# Patient Record
Sex: Female | Born: 1960 | Hispanic: Yes | Marital: Single | Smoking: Never smoker
Health system: Southern US, Community
[De-identification: ages and names within clinical notes are randomized; demographics above are authoritative.]

## PROBLEM LIST (undated history)

## (undated) DIAGNOSIS — N289 Disorder of kidney and ureter, unspecified: Secondary | ICD-10-CM

## (undated) DIAGNOSIS — I2510 Atherosclerotic heart disease of native coronary artery without angina pectoris: Secondary | ICD-10-CM

## (undated) DIAGNOSIS — N200 Calculus of kidney: Secondary | ICD-10-CM

## (undated) DIAGNOSIS — A419 Sepsis, unspecified organism: Secondary | ICD-10-CM

## (undated) DIAGNOSIS — I10 Essential (primary) hypertension: Secondary | ICD-10-CM

---

## 2010-01-18 ENCOUNTER — Ambulatory Visit (HOSPITAL_COMMUNITY): Admission: RE | Admit: 2010-01-18 | Discharge: 2010-01-18 | Payer: Self-pay | Admitting: Internal Medicine

## 2010-01-31 ENCOUNTER — Ambulatory Visit (HOSPITAL_COMMUNITY): Admission: RE | Admit: 2010-01-31 | Discharge: 2010-01-31 | Payer: Self-pay | Admitting: Internal Medicine

## 2010-08-08 ENCOUNTER — Ambulatory Visit (HOSPITAL_COMMUNITY)
Admission: RE | Admit: 2010-08-08 | Discharge: 2010-08-08 | Payer: Self-pay | Source: Home / Self Care | Admitting: Internal Medicine

## 2010-10-10 ENCOUNTER — Ambulatory Visit (HOSPITAL_COMMUNITY)
Admission: RE | Admit: 2010-10-10 | Discharge: 2010-10-10 | Payer: Self-pay | Source: Home / Self Care | Attending: Internal Medicine | Admitting: Internal Medicine

## 2011-04-15 ENCOUNTER — Other Ambulatory Visit (HOSPITAL_COMMUNITY): Payer: Self-pay | Admitting: Internal Medicine

## 2011-04-15 DIAGNOSIS — Z09 Encounter for follow-up examination after completed treatment for conditions other than malignant neoplasm: Secondary | ICD-10-CM

## 2011-05-29 ENCOUNTER — Encounter (HOSPITAL_COMMUNITY): Payer: Self-pay

## 2012-04-01 ENCOUNTER — Inpatient Hospital Stay (HOSPITAL_COMMUNITY): Admission: RE | Admit: 2012-04-01 | Payer: Self-pay | Source: Ambulatory Visit

## 2012-04-29 ENCOUNTER — Encounter (HOSPITAL_COMMUNITY): Payer: Self-pay

## 2012-08-26 ENCOUNTER — Other Ambulatory Visit (HOSPITAL_COMMUNITY)
Admission: RE | Admit: 2012-08-26 | Discharge: 2012-08-26 | Disposition: A | Discharge status: Home / Self Care | Payer: BC Managed Care – PPO | Source: Ambulatory Visit | Attending: Obstetrics and Gynecology | Admitting: Obstetrics and Gynecology

## 2012-08-26 DIAGNOSIS — Z1151 Encounter for screening for human papillomavirus (HPV): Secondary | ICD-10-CM | POA: Insufficient documentation

## 2012-08-26 DIAGNOSIS — Z01419 Encounter for gynecological examination (general) (routine) without abnormal findings: Secondary | ICD-10-CM | POA: Insufficient documentation

## 2012-10-07 HISTORY — PX: BREAST BIOPSY: SHX20

## 2013-10-07 DIAGNOSIS — A419 Sepsis, unspecified organism: Secondary | ICD-10-CM

## 2013-10-07 HISTORY — DX: Sepsis, unspecified organism: A41.9

## 2018-05-04 ENCOUNTER — Emergency Department: Payer: BLUE CROSS/BLUE SHIELD

## 2018-05-04 ENCOUNTER — Other Ambulatory Visit: Payer: Self-pay

## 2018-05-04 ENCOUNTER — Emergency Department
Admission: EM | Admit: 2018-05-04 | Discharge: 2018-05-04 | Disposition: A | Discharge status: Home / Self Care | Payer: Self-pay | Attending: Emergency Medicine | Admitting: Emergency Medicine

## 2018-05-04 ENCOUNTER — Encounter: Payer: Self-pay | Admitting: Emergency Medicine

## 2018-05-04 DIAGNOSIS — I2510 Atherosclerotic heart disease of native coronary artery without angina pectoris: Secondary | ICD-10-CM | POA: Insufficient documentation

## 2018-05-04 DIAGNOSIS — N23 Unspecified renal colic: Secondary | ICD-10-CM | POA: Insufficient documentation

## 2018-05-04 DIAGNOSIS — I10 Essential (primary) hypertension: Secondary | ICD-10-CM | POA: Insufficient documentation

## 2018-05-04 DIAGNOSIS — R911 Solitary pulmonary nodule: Secondary | ICD-10-CM | POA: Insufficient documentation

## 2018-05-04 HISTORY — DX: Atherosclerotic heart disease of native coronary artery without angina pectoris: I25.10

## 2018-05-04 HISTORY — DX: Essential (primary) hypertension: I10

## 2018-05-04 LAB — CHLAMYDIA/NGC RT PCR (ARMC ONLY)
Chlamydia Tr: NOT DETECTED
N GONORRHOEAE: NOT DETECTED

## 2018-05-04 LAB — URINALYSIS, ROUTINE W REFLEX MICROSCOPIC
BILIRUBIN URINE: NEGATIVE
Glucose, UA: NEGATIVE mg/dL
KETONES UR: NEGATIVE mg/dL
Nitrite: NEGATIVE
PROTEIN: 30 mg/dL — AB
RBC / HPF: >50 RBC/hpf — ABNORMAL HIGH (ref 0–5)
Specific Gravity, Urine: 1.026 (ref 1.005–1.030)
WBC, UA: >50 WBC/hpf — ABNORMAL HIGH (ref 0–5)
pH: 5 (ref 5.0–8.0)

## 2018-05-04 LAB — BASIC METABOLIC PANEL
Anion gap: 10 (ref 5–15)
BUN: 29 mg/dL — AB (ref 6–20)
CO2: 25 mmol/L (ref 22–32)
CREATININE: 0.9 mg/dL (ref 0.44–1.00)
Calcium: 9.2 mg/dL (ref 8.9–10.3)
Chloride: 108 mmol/L (ref 98–111)
Glucose, Bld: 110 mg/dL — ABNORMAL HIGH (ref 70–99)
POTASSIUM: 4.1 mmol/L (ref 3.5–5.1)
SODIUM: 143 mmol/L (ref 135–145)

## 2018-05-04 LAB — CBC
HCT: 35.8 % (ref 35.0–47.0)
HEMOGLOBIN: 12.9 g/dL (ref 12.0–16.0)
MCH: 29.8 pg (ref 26.0–34.0)
MCHC: 36 g/dL (ref 32.0–36.0)
MCV: 82.9 fL (ref 80.0–100.0)
PLATELETS: 343 10*3/uL (ref 150–440)
RBC: 4.32 MIL/uL (ref 3.80–5.20)
RDW: 14.3 % (ref 11.5–14.5)
WBC: 10.9 10*3/uL (ref 3.6–11.0)

## 2018-05-04 LAB — WET PREP, GENITAL
Clue Cells Wet Prep HPF POC: NONE SEEN
Sperm: NONE SEEN
TRICH WET PREP: NONE SEEN
YEAST WET PREP: NONE SEEN

## 2018-05-04 MED ORDER — TAMSULOSIN HCL 0.4 MG PO CAPS
0.4000 mg | ORAL_CAPSULE | Freq: Every day | ORAL | 0 refills | Status: DC
Start: 2018-05-04 — End: 2018-11-18

## 2018-05-04 MED ORDER — OXYCODONE-ACETAMINOPHEN 7.5-325 MG PO TABS: 1.0000 | ORAL_TABLET | Freq: Three times a day (TID) | ORAL | 0 refills | Status: DC | PRN

## 2018-05-04 MED ORDER — KETOROLAC TROMETHAMINE 60 MG/2ML IM SOLN
INTRAMUSCULAR | Status: AC
  Administered 2018-05-04: 60 mg via INTRAMUSCULAR
  Filled 2018-05-04: qty 2

## 2018-05-04 MED ORDER — KETOROLAC TROMETHAMINE 60 MG/2ML IM SOLN
60.0000 mg | Freq: Once | INTRAMUSCULAR | Status: AC
  Administered 2018-05-04: 60 mg via INTRAMUSCULAR

## 2018-05-04 NOTE — ED Notes (Signed)
Pt to CT. ABCs intact. NAD 

## 2018-05-04 NOTE — ED Notes (Signed)
Pt reports having intercourse Tuesday, reports same pain during intercourse, noticing scant amounts of blood and brownish discharge in following days.

## 2018-05-04 NOTE — ED Triage Notes (Signed)
Pt in via POV with complaints of vaginal pain, reports having mirena device inserted x 3 years ago, reports pain feels like a "pencil stabbing me."  Pt to PCP Friday due to same pain, dx with UTI, starting Keflex Saturday with no relief of pain.  NAD noted at this time.

## 2018-05-04 NOTE — ED Notes (Signed)
MD at bedside to perform pelvic exam and obtain wet prep/chlamydia specimens

## 2018-05-04 NOTE — ED Provider Notes (Signed)
[facility] Emergency Department Provider Note       Time seen: ----------------------------------------- 9:23 PM on 05/04/2018 -----------------------------------------   I have reviewed the triage vital signs and the nursing notes.  HISTORY   Chief Complaint Vaginal Pain    HPI Sarah Davila is a 57 y.o. female with a history of coronary artery disease and hypertension who presents to the ED for pelvic pain.  Patient presents to the ER after having intercourse on Tuesday, she reports some pain during intercourse with scant amount of blood and brownish discharge in the following days.  She did recently have some left flank pain as well.  She denies fevers, chills or other complaints.  She was seen by her primary care doctor and started on antibiotics for UTI.  Past Medical History:  Diagnosis Date  . Coronary artery disease   . Hypertension     There are no active problems to display for this patient.   History reviewed. No pertinent surgical history.  Allergies Ciprofloxacin  Social History Social History   Tobacco Use  . Smoking status: Never Smoker  . Smokeless tobacco: Never Used  Substance Use Topics  . Alcohol use: Never    Frequency: Never  . Drug use: Never   Review of Systems Constitutional: Negative for fever. Cardiovascular: Negative for chest pain. Respiratory: Negative for shortness of breath. Gastrointestinal: Negative for abdominal pain, vomiting and diarrhea. Genitourinary: Positive for intermittent dysuria and pelvic pain Musculoskeletal: Negative for back pain. Skin: Negative for rash. Neurological: Negative for headaches, focal weakness or numbness.  All systems negative/normal/unremarkable except as stated in the HPI  ____________________________________________   PHYSICAL EXAM:  VITAL SIGNS: ED Triage Vitals  Enc Vitals Group     BP 05/04/18 2111 (!) 174/81     Pulse Rate 05/04/18 2111 100      Resp 05/04/18 2111 16     Temp 05/04/18 2111 98.5 F (36.9 C)     Temp Source 05/04/18 2111 Oral     SpO2 05/04/18 2111 97 %     Weight 05/04/18 2113 242 lb (109.8 kg)     Height 05/04/18 2113 5\' 2"  (1.575 m)     Head Circumference --      Peak Flow --      Pain Score 05/04/18 2112 8     Pain Loc --      Pain Edu? --      Excl. in GC? --    Constitutional: Alert and oriented. Well appearing and in no distress. Eyes: Conjunctivae are normal. Normal extraocular movements. ENT   Head: Normocephalic and atraumatic.   Nose: No congestion/rhinnorhea.   Mouth/Throat: Mucous membranes are moist.   Neck: No stridor. Cardiovascular: Normal rate, regular rhythm. No murmurs, rubs, or gallops. Respiratory: Normal respiratory effort without tachypnea nor retractions. Breath sounds are clear and equal bilaterally. No wheezes/rales/rhonchi. Gastrointestinal: Soft and nontender. Normal bowel sounds Genitourinary: Normal pelvic exam, slight left adnexal tenderness Musculoskeletal: Nontender with normal range of motion in extremities. No lower extremity tenderness nor edema. Neurologic:  Normal speech and language. No gross focal neurologic deficits are appreciated.  Skin:  Skin is warm, dry and intact. No rash noted. Psychiatric: Mood and affect are normal. Speech and behavior are normal.  ____________________________________________  ED COURSE:  As part of my medical decision making, I reviewed the following data within the electronic MEDICAL RECORD NUMBER History obtained from family if available, nursing notes, old chart and ekg, as well as notes  from prior ED visits. Patient presented for pelvic pain, we will assess with labs and imaging as indicated at this time.   Procedures ____________________________________________   LABS (pertinent positives/negatives)  Labs Reviewed  WET PREP, GENITAL - Abnormal; Notable for the following components:      Result Value   WBC, Wet Prep HPF  POC RARE (*)    All other components within normal limits  URINALYSIS, ROUTINE W REFLEX MICROSCOPIC - Abnormal; Notable for the following components:   Color, Urine YELLOW (*)    APPearance HAZY (*)    Hgb urine dipstick MODERATE (*)    Protein, ur 30 (*)    Leukocytes, UA SMALL (*)    RBC / HPF >50 (*)    WBC, UA >50 (*)    Bacteria, UA RARE (*)    All other components within normal limits  BASIC METABOLIC PANEL - Abnormal; Notable for the following components:   Glucose, Bld 110 (*)    BUN 29 (*)    All other components within normal limits  CHLAMYDIA/NGC RT PCR ([facility] ONLY)  URINE CULTURE  CBC    RADIOLOGY Images were viewed by me  CT renal protocol IMPRESSION: 1. Two adjacent obstructive calculi within the bladder lumen just beyond the left UVJ measuring up to 9 mm with secondary mild to moderate left hydroureteronephrosis. 2. Additional bilateral nonobstructive nephrolithiasis as above. 3. Cholelithiasis. 4. 4 mm left lower lobe pulmonary nodule, indeterminate. No follow-up needed if patient is low-risk. Non-contrast chest CT can be considered in 12 months if patient is high-risk. This recommendation follows the consensus statement: Guidelines for Management of Incidental Pulmonary Nodules Detected on CT Images: From the Fleischner Society 2017; Radiology 2017; 284:228-243.  ____________________________________________  DIFFERENTIAL DIAGNOSIS   Renal colic, UTI, pyelonephritis, gas pain, STD, ovarian cyst  FINAL ASSESSMENT AND PLAN  Renal colic   Plan: The patient had presented for pelvic pain which appears to be related to to very large kidney stones which have made their way into her bladder. Patient's labs revealed the underlying kidney stone problem. Patient's imaging was dictated as above.  She will be discharged with pain medicine and Flomax.  She is encouraged to continue taking Keflex and will follow-up as an outpatient with  urology.   Ulice Dash, MD   Note: This note was generated in part or whole with voice recognition software. Voice recognition is usually quite accurate but there are transcription errors that can and very often do occur. I apologize for any typographical errors that were not detected and corrected.     Emily Filbert, MD 05/04/18 2255

## 2018-05-06 LAB — URINE CULTURE
CULTURE: NO GROWTH
Special Requests: NORMAL

## 2018-11-18 ENCOUNTER — Other Ambulatory Visit: Payer: Self-pay

## 2018-11-18 ENCOUNTER — Encounter (HOSPITAL_COMMUNITY): Payer: Self-pay | Admitting: Emergency Medicine

## 2018-11-18 ENCOUNTER — Emergency Department (HOSPITAL_COMMUNITY)
Admission: EM | Admit: 2018-11-18 | Discharge: 2018-11-18 | Disposition: A | Discharge status: Home / Self Care | Payer: PRIVATE HEALTH INSURANCE | Attending: Emergency Medicine | Admitting: Emergency Medicine

## 2018-11-18 DIAGNOSIS — R109 Unspecified abdominal pain: Secondary | ICD-10-CM | POA: Diagnosis present

## 2018-11-18 DIAGNOSIS — Z79899 Other long term (current) drug therapy: Secondary | ICD-10-CM | POA: Insufficient documentation

## 2018-11-18 DIAGNOSIS — I10 Essential (primary) hypertension: Secondary | ICD-10-CM | POA: Diagnosis not present

## 2018-11-18 DIAGNOSIS — N201 Calculus of ureter: Secondary | ICD-10-CM | POA: Insufficient documentation

## 2018-11-18 DIAGNOSIS — Z7982 Long term (current) use of aspirin: Secondary | ICD-10-CM | POA: Diagnosis not present

## 2018-11-18 DIAGNOSIS — I2510 Atherosclerotic heart disease of native coronary artery without angina pectoris: Secondary | ICD-10-CM | POA: Diagnosis not present

## 2018-11-18 HISTORY — DX: Disorder of kidney and ureter, unspecified: N28.9

## 2018-11-18 HISTORY — DX: Sepsis, unspecified organism: A41.9

## 2018-11-18 LAB — CBC
HEMATOCRIT: 44.1 % (ref 36.0–46.0)
Hemoglobin: 13.3 g/dL (ref 12.0–15.0)
MCH: 25 pg — ABNORMAL LOW (ref 26.0–34.0)
MCHC: 30.2 g/dL (ref 30.0–36.0)
MCV: 83.1 fL (ref 80.0–100.0)
NRBC: 0 % (ref 0.0–0.2)
Platelets: 463 10*3/uL — ABNORMAL HIGH (ref 150–400)
RBC: 5.31 MIL/uL — ABNORMAL HIGH (ref 3.87–5.11)
RDW: 13.8 % (ref 11.5–15.5)
WBC: 9.7 10*3/uL (ref 4.0–10.5)

## 2018-11-18 LAB — URINALYSIS, ROUTINE W REFLEX MICROSCOPIC
Bacteria, UA: NONE SEEN
Bilirubin Urine: NEGATIVE
Glucose, UA: NEGATIVE mg/dL
Ketones, ur: NEGATIVE mg/dL
Nitrite: NEGATIVE
Protein, ur: 100 mg/dL — AB
SPECIFIC GRAVITY, URINE: 1.012 (ref 1.005–1.030)
WBC, UA: >50 WBC/hpf — ABNORMAL HIGH (ref 0–5)
pH: 6 (ref 5.0–8.0)

## 2018-11-18 LAB — BASIC METABOLIC PANEL
ANION GAP: 10 (ref 5–15)
BUN: 14 mg/dL (ref 6–20)
CHLORIDE: 103 mmol/L (ref 98–111)
CO2: 26 mmol/L (ref 22–32)
Calcium: 9.4 mg/dL (ref 8.9–10.3)
Creatinine, Ser: 0.77 mg/dL (ref 0.44–1.00)
Glucose, Bld: 94 mg/dL (ref 70–99)
Potassium: 4.1 mmol/L (ref 3.5–5.1)
SODIUM: 139 mmol/L (ref 135–145)

## 2018-11-18 NOTE — Discharge Instructions (Addendum)
Follow-up with urologist as needed.  Watch for fevers.  A urine culture has been sent.

## 2018-11-18 NOTE — ED Triage Notes (Signed)
Patient reports amber colored urine with flank pain. Symptoms of infection started last week, pain this am. Patient reports changes in her urine color and smell. L flank pain, has history of kidney stones.

## 2018-11-18 NOTE — ED Provider Notes (Signed)
[facility] EMERGENCY DEPARTMENT Provider Note   CSN: [id number] Arrival date & time: 11/18/18  1244     History   Chief Complaint Chief Complaint  Patient presents with  . Urinary Tract Infection    HPI Sarah Davila is a 58 y.o. female.  HPI Patient presents with some left flank pain and cloudy foamy urine.  States she has had URI symptoms for a week or 2 is been eating and drinking less.  States she has a history of kidney stones.  States she feels a little pole like there could be a stone on the left side.  No fevers.  Does not hurt to urinate but states it smells different looks different.  No abdominal pain or vomiting.  States her cough is improved somewhat.  No shortness of breath.  Has had urinary sepsis in the past.  Multiple prior kidney stones. Past Medical History:  Diagnosis Date  . Coronary artery disease   . Hypertension   . Renal disorder   . Sepsis (HCC) 2015   urinary    There are no active problems to display for this patient.   History reviewed. No pertinent surgical history.   OB History    Gravida      Para      Term      Preterm      AB      Living  0     SAB      TAB      Ectopic      Multiple      Live Births               Home Medications    Prior to Admission medications   Medication Sig Start Date End Date Taking? Authorizing Provider  aspirin EC 81 MG tablet Take 81 mg by mouth daily.   Yes [provider]  Black Cohosh 200 MG CAPS Take 2 capsules by mouth daily.   Yes [provider]  docusate sodium (STOOL SOFTENER) 100 MG capsule Take 100 mg by mouth 2 (two) times daily.   Yes [provider]  fexofenadine (ALLEGRA) 180 MG tablet Take 180 mg by mouth daily.   Yes [provider]  loratadine (CLARITIN) 10 MG tablet Take 10 mg by mouth daily.   Yes [provider]  magnesium (MAGTAB) 84 MG ( ) TBCR SR tablet Take 84 mg by mouth daily.   Yes [provider]  Naproxen Sodium (ALEVE PO) Take 1 tablet by mouth as needed.   Yes [provider]  vitamin B-12 (CYANOCOBALAMIN) 250 MCG tablet Take 250 mcg by mouth daily.   Yes [provider]    Family History Family History  Problem Relation Age of Onset  . Hepatitis Mother   . Cirrhosis Mother   . Diabetes Father   . Cancer Other     Social History Social History   Tobacco Use  . Smoking status: Never Smoker  . Smokeless tobacco: Never Used  Substance Use Topics  . Alcohol use: Never    Frequency: Never  . Drug use: Never     Allergies   Ciprofloxacin   Review of Systems Review of Systems  Constitutional: Positive for appetite change. Negative for chills and fever.  HENT: Negative for congestion.   Respiratory: Positive for cough.   Gastrointestinal: Negative for abdominal pain.  Genitourinary: Positive for decreased urine volume and flank pain.  Musculoskeletal: Negative for back pain.  Skin: Negative  for rash and wound.  Neurological: Negative for weakness.  Psychiatric/Behavioral: Negative for confusion.     Physical Exam Updated Vital Signs BP (!) 171/87 (BP Location: Right Arm)   Pulse 91   Temp 98.1 F (36.7 C) (Oral)   Resp 18   Ht 5\' 2"  (1.575 m)   Wt 105.8 kg   SpO2 98%   BMI 42.67 kg/m   Physical Exam HENT:     Head: Normocephalic.     Mouth/Throat:     Mouth: Mucous membranes are moist.  Neck:     Musculoskeletal: Neck supple.  Cardiovascular:     Rate and Rhythm: Regular rhythm.  Pulmonary:     Comments: Mildly harsh breath sounds without focal rales or rhonchi. Abdominal:     Tenderness: There is no abdominal tenderness.  Genitourinary:    Comments: No CVA tenderness. Skin:    General: Skin is warm.     Capillary Refill: Capillary refill takes less than 2 seconds.  Neurological:     Mental Status: She is alert. Mental status is at baseline.  Psychiatric:        Mood and Affect: Mood normal.       ED Treatments / Results  Labs (all labs ordered are listed, but only abnormal results are displayed) Labs Reviewed  URINALYSIS, ROUTINE W REFLEX MICROSCOPIC - Abnormal; Notable for the following components:      Result Value   Color, Urine AMBER (*)    APPearance CLOUDY (*)    Hgb urine dipstick MODERATE (*)    Protein, ur 100 (*)    Leukocytes,Ua LARGE (*)    RBC / HPF >50 (*)    WBC, UA >50 (*)    Non Squamous Epithelial 0-5 (*)    All other components within normal limits  CBC - Abnormal; Notable for the following components:   RBC 5.31 (*)    MCH 25.0 (*)    Platelets 463 (*)    All other components within normal limits  URINE CULTURE  BASIC METABOLIC PANEL    EKG None  Radiology No results found.  Procedures Procedures (including critical care time)  Medications Ordered in ED Medications - No data to display   Initial Impression / Assessment and Plan / ED Course  I have reviewed the triage vital signs and the nursing notes.  Pertinent labs & imaging results that were available during my care of the patient were reviewed by me and considered in my medical decision making (see chart for details).    Patient with flank pain.  Urine does not show infection but has had previous kidney stones.  I think this is likely not a stone.  Feels better after treatment.  Discharge home with outpatient follow-up as needed.   Final Clinical Impressions(s) / ED Diagnoses   Final diagnoses:  Flank pain  Left ureteral stone    ED Discharge Orders    None       Benjiman Core, MD 11/19/18 [phone]

## 2018-11-18 NOTE — ED Notes (Signed)
Pt not in room.

## 2018-11-21 LAB — URINE CULTURE

## 2018-11-22 ENCOUNTER — Telehealth: Payer: Self-pay | Admitting: Emergency Medicine

## 2018-11-22 NOTE — Progress Notes (Signed)
ED Antimicrobial Stewardship Positive Culture Follow Up   Sarah Davila is an 58 y.o. female who presented to [facility] on 11/18/2018 with a chief complaint of  Chief Complaint  Patient presents with  . Urinary Tract Infection    Recent Results (from the past 720 hour(s))  Urine culture     Status: Abnormal   Collection Time: 11/18/18  1:01 PM  Result Value Ref Range Status   Specimen Description   Final    URINE, RANDOM Performed at [facility], [address], [place], [place] [address]    Special Requests   Final    NONE Performed at [facility], [address], [place], [place] [address]    Culture >=100,000 COLONIES/mL ESCHERICHIA COLI (A)  Final   Report Status 11/21/2018 FINAL  Final   Organism ID, Bacteria ESCHERICHIA COLI (A)  Final      Susceptibility   Escherichia coli - MIC*    AMPICILLIN 8 SENSITIVE Sensitive     CEFAZOLIN <=4 SENSITIVE Sensitive     CEFTRIAXONE <=1 SENSITIVE Sensitive     CIPROFLOXACIN >=4 RESISTANT Resistant     GENTAMICIN <=1 SENSITIVE Sensitive     IMIPENEM <=0.25 SENSITIVE Sensitive     NITROFURANTOIN <=16 SENSITIVE Sensitive     TRIMETH/SULFA <=20 SENSITIVE Sensitive     AMPICILLIN/SULBACTAM 4 SENSITIVE Sensitive     PIP/TAZO <=4 SENSITIVE Sensitive     Extended ESBL NEGATIVE Sensitive     * >=100,000 COLONIES/mL ESCHERICHIA COLI    [x]  Patient discharged originally without antimicrobial agent and treatment is now indicated  New antibiotic prescription: Keflex 500 mg po BID for 7 days  ED Provider: Army Melia, PA-C   Siani, Halicki 11/22/2018, 10:45 AM Clinical Pharmacist Monday - Friday phone -  [phone] Saturday - Sunday phone - [phone]

## 2018-11-22 NOTE — Telephone Encounter (Signed)
Post ED Visit - Positive Culture Follow-up: Successful Patient Follow-Up  Culture assessed and recommendations reviewed by:  []  Enzo Bi, Pharm.D. []  Celedonio Miyamoto, [address].D., BCPS AQ-ID []  Garvin Fila, Pharm.D., BCPS []  Georgina Pillion, Pharm.D., BCPS []  [place], [address].D., BCPS, AAHIVP []  Estella Husk, Pharm.D., BCPS, AAHIVP []  Lysle Pearl, PharmD, BCPS []  Phillips Climes, PharmD, BCPS []  Agapito Games, PharmD, BCPS []  Verlan Friends, PharmD Link Snuffer PharmD  Positive urine culture  [x]  Patient discharged without antimicrobial prescription and treatment is now indicated []  Organism is resistant to prescribed ED discharge antimicrobial []  Patient with positive blood cultures  Changes discussed with ED provider: Army Melia PA New antibiotic prescription start Keflex 500mg  po bid x 7 days Called to [facility]  Contacted patient    Berle Mull 11/22/2018, 2:32 PM

## 2018-11-30 ENCOUNTER — Encounter (HOSPITAL_COMMUNITY): Payer: Self-pay | Admitting: Emergency Medicine

## 2018-11-30 ENCOUNTER — Emergency Department (HOSPITAL_COMMUNITY)
Admission: EM | Admit: 2018-11-30 | Discharge: 2018-11-30 | Disposition: A | Discharge status: Home / Self Care | Payer: PRIVATE HEALTH INSURANCE | Attending: Emergency Medicine | Admitting: Emergency Medicine

## 2018-11-30 ENCOUNTER — Other Ambulatory Visit: Payer: Self-pay

## 2018-11-30 DIAGNOSIS — Z7982 Long term (current) use of aspirin: Secondary | ICD-10-CM | POA: Diagnosis not present

## 2018-11-30 DIAGNOSIS — I10 Essential (primary) hypertension: Secondary | ICD-10-CM | POA: Diagnosis not present

## 2018-11-30 DIAGNOSIS — R300 Dysuria: Secondary | ICD-10-CM | POA: Diagnosis present

## 2018-11-30 DIAGNOSIS — I2510 Atherosclerotic heart disease of native coronary artery without angina pectoris: Secondary | ICD-10-CM | POA: Insufficient documentation

## 2018-11-30 DIAGNOSIS — Z79899 Other long term (current) drug therapy: Secondary | ICD-10-CM | POA: Insufficient documentation

## 2018-11-30 LAB — URINALYSIS, ROUTINE W REFLEX MICROSCOPIC
Bacteria, UA: NONE SEEN
Bilirubin Urine: NEGATIVE
GLUCOSE, UA: NEGATIVE mg/dL
HGB URINE DIPSTICK: NEGATIVE
Ketones, ur: NEGATIVE mg/dL
NITRITE: NEGATIVE
PH: 7 (ref 5.0–8.0)
PROTEIN: NEGATIVE mg/dL
Specific Gravity, Urine: 1.014 (ref 1.005–1.030)
WBC, UA: >50 WBC/hpf — ABNORMAL HIGH (ref 0–5)

## 2018-11-30 MED ORDER — LISINOPRIL 20 MG PO TABS: 20.0000 mg | ORAL_TABLET | Freq: Every day | ORAL | 1 refills | Status: DC

## 2018-11-30 MED ORDER — FLUCONAZOLE 100 MG PO TABS
200.0000 mg | ORAL_TABLET | Freq: Once | ORAL | Status: AC
  Administered 2018-11-30: 200 mg via ORAL
  Filled 2018-11-30: qty 2

## 2018-11-30 MED ORDER — NITROFURANTOIN MONOHYD MACRO 100 MG PO CAPS: 100.0000 mg | ORAL_CAPSULE | Freq: Two times a day (BID) | ORAL | 0 refills | Status: DC

## 2018-11-30 MED ORDER — CEFTRIAXONE SODIUM 1 G IJ SOLR
1.0000 g | Freq: Once | INTRAMUSCULAR | Status: AC
  Administered 2018-11-30: 1 g via INTRAMUSCULAR
  Filled 2018-11-30: qty 10

## 2018-11-30 MED ORDER — LIDOCAINE HCL (PF) 1 % IJ SOLN
INTRAMUSCULAR | Status: AC
Start: 2018-11-30 — End: 2018-11-30
  Administered 2018-11-30: 1.9 mL
  Filled 2018-11-30: qty 5

## 2018-11-30 NOTE — ED Notes (Signed)
Patient seen and evaluated by EDPa for initial assessment. 

## 2018-11-30 NOTE — ED Provider Notes (Signed)
[facility] EMERGENCY DEPARTMENT Provider Note   CSN: [id number] Arrival date & time: 11/30/18  1150    History   Chief Complaint Chief Complaint  Patient presents with  . Dysuria    HPI Sarah Davila is a 58 y.o. female.     The history is provided by the patient.  Dysuria  Pain severity:  Moderate Duration:  3 weeks Timing:  Intermittent Progression:  Worsening Chronicity:  New Recent urinary tract infections: no   Relieved by:  Nothing Worsened by:  Nothing Ineffective treatments: Patient states that she has been on an antibiotic for the past 7 days, she finished it on February 23, but continues to have symptoms. 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 Coronary artery disease   . Hypertension   . Renal disorder   . Sepsis (HCC) 2015   urinary    There are no active problems to display for this patient.   History reviewed. No pertinent surgical history.   OB History    Gravida      Para      Term      Preterm      AB      Living  0     SAB      TAB      Ectopic      Multiple      Live Births               Home Medications    Prior to Admission medications   Medication Sig Start Date End Date Taking? Authorizing Provider  aspirin EC 81 MG tablet Take 81 mg by mouth daily.    [provider]  Black Cohosh 200 MG CAPS Take 2 capsules by mouth daily.    [provider]  docusate sodium (STOOL SOFTENER) 100 MG capsule Take 100 mg by mouth 2 (two) times daily.    [provider]  fexofenadine (ALLEGRA) 180 MG tablet Take 180 mg by mouth daily.    [provider]  loratadine (CLARITIN) 10 MG tablet Take 10 mg  by mouth daily.    [provider]  magnesium (MAGTAB) 84 MG ( ) TBCR SR tablet Take 84 mg by mouth daily.    [provider]  Naproxen Sodium (ALEVE PO) Take 1 tablet by mouth as needed.    [provider]  vitamin B-12 (CYANOCOBALAMIN) 250 MCG tablet Take 250 mcg by mouth daily.    [provider]    Family History Family History  Problem Relation Age of Onset  . Hepatitis Mother   . Cirrhosis Mother   . Diabetes Father   . Cancer Other     Social History Social History   Tobacco Use  . Smoking status: Never Smoker  . Smokeless tobacco: Never Used  Substance Use Topics  . Alcohol use: Never    Frequency: Never  . Drug use: Never     Allergies   Ciprofloxacin   Review of Systems Review of Systems  Constitutional: Negative for activity change and fever.       All ROS Neg except as noted in HPI  HENT: Negative for nosebleeds.   Eyes: Negative  for photophobia and discharge.  Respiratory: Negative for cough, shortness of breath and wheezing.   Cardiovascular: Negative for chest pain and palpitations.  Gastrointestinal: Negative for abdominal pain, blood in stool, nausea and vomiting.  Genitourinary: Positive for dysuria. Negative for frequency and hematuria.  Musculoskeletal: Negative for arthralgias, back pain and neck pain.  Skin: Negative.   Neurological: Negative for dizziness, seizures and speech difficulty.  Psychiatric/Behavioral: Negative for confusion and hallucinations.     Physical Exam Updated Vital Signs BP (!) 178/93 (BP Location: Right Arm)   Pulse 98   Temp 98.3 F (36.8 C) (Oral)   Resp 16   Ht 5\' 2"  (1.575 m)   Wt 105.8 kg   SpO2 98%   BMI 42.67 kg/m   Physical Exam Vitals signs and nursing note reviewed.  Constitutional:      General: She is not in acute distress.    Appearance: She is well-developed.  HENT:     Head: Normocephalic and atraumatic.     Right Ear: External ear normal.     Left  Ear: External ear normal.  Eyes:     General: No scleral icterus.       Right eye: No discharge.        Left eye: No discharge.     Conjunctiva/sclera: Conjunctivae normal.  Neck:     Musculoskeletal: Neck supple.     Trachea: No tracheal deviation.  Cardiovascular:     Rate and Rhythm: Normal rate and regular rhythm.  Pulmonary:     Effort: Pulmonary effort is normal. No respiratory distress.     Breath sounds: Normal breath sounds. No stridor. No wheezing or rales.  Abdominal:     General: Bowel sounds are normal. There is no distension.     Palpations: Abdomen is soft.     Tenderness: There is no abdominal tenderness. There is no guarding or rebound.  Musculoskeletal:        General: No tenderness.  Skin:    General: Skin is warm and dry.     Findings: No rash.  Neurological:     Mental Status: She is alert.     Cranial Nerves: No cranial nerve deficit (no facial droop, extraocular movements intact, no slurred speech).     Sensory: No sensory deficit.     Motor: No abnormal muscle tone or seizure activity.     Coordination: Coordination normal.      ED Treatments / Results  Labs (all labs ordered are listed, but only abnormal results are displayed) Labs Reviewed  URINALYSIS, ROUTINE W REFLEX MICROSCOPIC - Abnormal; Notable for the following components:      Result Value   APPearance CLOUDY (*)    Leukocytes,Ua SMALL (*)    WBC, UA >50 (*)    All other components within normal limits  URINE CULTURE    EKG None  Radiology No results found.  Procedures Procedures (including critical care time)  Medications Ordered in ED Medications - No data to display   Initial Impression / Assessment and Plan / ED Course  I have reviewed the triage vital signs and the nursing notes.  Pertinent labs & imaging results that were available during my care of the patient were reviewed by me and considered in my medical decision making (see chart for details).           Final Clinical Impressions(s) / ED Diagnoses MDM  Patient complains of dark foamy urine that sometimes has an odor.  She says that  she is concerned because the urine was similar to this on occasion when she had sepsis.  She is not had fever, chills, nor any severe pain.  Blood pressure is elevated at 178/93.  The patient states that it is elevated because she has been out of her medication.  She does not have a primary physician yet.  The remainder the vital signs within normal limits.  Pulse oximetry is 98% on room air.  Within normal limits by my interpretation.  Urine analysis shows a cloudy yellow specimen with a specific gravity of 1.014.  Nitrates are negative.  There is a small leukocyte esterase present.  There are 21-50 red cells, and greater than 50 white blood cells.  There is also noted budding yeast present.  The white blood cells are in clumps.  I have reviewed the culture of the previous urine.  Patient will be given Rocephin, and will also receive a course of nitrofurantoin. Patient is to return to the emergency department if any changes in condition, problems, or concerns before being seen by a primary physician.   Final diagnoses:  Essential hypertension    ED Discharge Orders         Ordered    lisinopril (PRINIVIL,ZESTRIL) 20 MG tablet  Daily     11/30/18 1506    nitrofurantoin, macrocrystal-monohydrate, (MACROBID) 100 MG capsule  2 times daily     11/30/18 1508           Ivery Quale, PA-C 11/30/18 1509    Vanetta Mulders, MD 11/30/18 [phone]

## 2018-11-30 NOTE — Discharge Instructions (Addendum)
Please increase fluids.  Please use Macrobid 2 times daily.  A culture of your urine has been sent to the lab.  Please return to the emergency department if you have fever, chills, vomiting, back pain, or signs of advancing infection or kidney stone before you are evaluated by your new primary physician. There was yeast in your urine test.  You were treated for this with Diflucan tablet.

## 2018-11-30 NOTE — ED Triage Notes (Addendum)
Patient complaining of dark colored "foamy" urine x 3 weeks. Denies dysuria. States she was treated here recently for same.

## 2018-12-02 LAB — URINE CULTURE
Culture: NO GROWTH
SPECIAL REQUESTS: NORMAL

## 2018-12-08 ENCOUNTER — Other Ambulatory Visit: Payer: Self-pay

## 2018-12-08 ENCOUNTER — Encounter (HOSPITAL_COMMUNITY): Payer: Self-pay | Admitting: Emergency Medicine

## 2018-12-08 ENCOUNTER — Emergency Department (HOSPITAL_COMMUNITY)
Admission: EM | Admit: 2018-12-08 | Discharge: 2018-12-08 | Disposition: A | Discharge status: Home / Self Care | Payer: PRIVATE HEALTH INSURANCE | Attending: Emergency Medicine | Admitting: Emergency Medicine

## 2018-12-08 ENCOUNTER — Emergency Department (HOSPITAL_COMMUNITY): Payer: PRIVATE HEALTH INSURANCE

## 2018-12-08 DIAGNOSIS — Z7982 Long term (current) use of aspirin: Secondary | ICD-10-CM | POA: Insufficient documentation

## 2018-12-08 DIAGNOSIS — R300 Dysuria: Secondary | ICD-10-CM | POA: Diagnosis present

## 2018-12-08 DIAGNOSIS — I10 Essential (primary) hypertension: Secondary | ICD-10-CM | POA: Diagnosis not present

## 2018-12-08 DIAGNOSIS — Z79899 Other long term (current) drug therapy: Secondary | ICD-10-CM | POA: Insufficient documentation

## 2018-12-08 DIAGNOSIS — N200 Calculus of kidney: Secondary | ICD-10-CM | POA: Insufficient documentation

## 2018-12-08 DIAGNOSIS — K8020 Calculus of gallbladder without cholecystitis without obstruction: Secondary | ICD-10-CM | POA: Diagnosis not present

## 2018-12-08 DIAGNOSIS — I2510 Atherosclerotic heart disease of native coronary artery without angina pectoris: Secondary | ICD-10-CM | POA: Diagnosis not present

## 2018-12-08 DIAGNOSIS — R319 Hematuria, unspecified: Secondary | ICD-10-CM

## 2018-12-08 DIAGNOSIS — N390 Urinary tract infection, site not specified: Secondary | ICD-10-CM | POA: Diagnosis not present

## 2018-12-08 LAB — CBC WITH DIFFERENTIAL/PLATELET
ABS IMMATURE GRANULOCYTES: 0.06 10*3/uL (ref 0.00–0.07)
Basophils Absolute: 0.1 10*3/uL (ref 0.0–0.1)
Basophils Relative: 1 %
Eosinophils Absolute: 0.4 10*3/uL (ref 0.0–0.5)
Eosinophils Relative: 4 %
HCT: 42.1 % (ref 36.0–46.0)
Hemoglobin: 12.5 g/dL (ref 12.0–15.0)
Immature Granulocytes: 1 %
Lymphocytes Relative: 26 %
Lymphs Abs: 2.6 10*3/uL (ref 0.7–4.0)
MCH: 25.2 pg — ABNORMAL LOW (ref 26.0–34.0)
MCHC: 29.7 g/dL — ABNORMAL LOW (ref 30.0–36.0)
MCV: 84.7 fL (ref 80.0–100.0)
Monocytes Absolute: 0.7 10*3/uL (ref 0.1–1.0)
Monocytes Relative: 7 %
NEUTROS ABS: 6 10*3/uL (ref 1.7–7.7)
NEUTROS PCT: 61 %
PLATELETS: 398 10*3/uL (ref 150–400)
RBC: 4.97 MIL/uL (ref 3.87–5.11)
RDW: 15.3 % (ref 11.5–15.5)
WBC: 9.7 10*3/uL (ref 4.0–10.5)
nRBC: 0 % (ref 0.0–0.2)

## 2018-12-08 LAB — PREGNANCY, URINE: Preg Test, Ur: NEGATIVE

## 2018-12-08 LAB — URINALYSIS, ROUTINE W REFLEX MICROSCOPIC
BILIRUBIN URINE: NEGATIVE
Glucose, UA: NEGATIVE mg/dL
Ketones, ur: NEGATIVE mg/dL
Nitrite: NEGATIVE
Protein, ur: NEGATIVE mg/dL
SPECIFIC GRAVITY, URINE: 1.011 (ref 1.005–1.030)
WBC, UA: >50 WBC/hpf — ABNORMAL HIGH (ref 0–5)
pH: 6 (ref 5.0–8.0)

## 2018-12-08 LAB — BASIC METABOLIC PANEL
ANION GAP: 10 (ref 5–15)
BUN: 21 mg/dL — ABNORMAL HIGH (ref 6–20)
CO2: 23 mmol/L (ref 22–32)
Calcium: 9.2 mg/dL (ref 8.9–10.3)
Chloride: 105 mmol/L (ref 98–111)
Creatinine, Ser: 0.73 mg/dL (ref 0.44–1.00)
GFR calc Af Amer: >60 mL/min (ref >60)
GFR calc non Af Amer: >60 mL/min (ref >60)
GLUCOSE: 92 mg/dL (ref 70–99)
Potassium: 3.9 mmol/L (ref 3.5–5.1)
Sodium: 138 mmol/L (ref 135–145)

## 2018-12-08 MED ORDER — CEPHALEXIN 500 MG PO CAPS: 500.0000 mg | ORAL_CAPSULE | Freq: Four times a day (QID) | ORAL | 0 refills | Status: AC

## 2018-12-08 MED ORDER — FLUCONAZOLE 150 MG PO TABS: 150.0000 mg | ORAL_TABLET | Freq: Every day | ORAL | 1 refills | Status: DC

## 2018-12-08 NOTE — Discharge Instructions (Addendum)
Return if any problems. See the Urologist for recheck  

## 2018-12-08 NOTE — ED Provider Notes (Signed)
[facility] EMERGENCY DEPARTMENT Provider Note   CSN: [id number] Arrival date & time: 12/08/18  1259    History   Chief Complaint Chief Complaint  Patient presents with  . Follow-up    HPI Sarah Davila is a 58 y.o. female.     The history is provided by the patient. No language interpreter was used.  Dysuria  Pain quality:  Aching Pain severity:  Mild Onset quality:  Gradual Timing:  Constant Progression:  Worsening Chronicity:  New Recent urinary tract infections: yes   Relieved by:  Nothing Worsened by:  Nothing Ineffective treatments:  Antibiotics Urinary symptoms: foul-smelling urine   Associated symptoms: no abdominal pain, no fever and no flank pain   Pt reports she has had kidney stones in the past.  Pt has been on 2 antibiotics for uti.  Pt reports urine is still dark and that her urine bubbles in the toliet  Past Medical History:  Diagnosis Date  . Coronary artery disease   . Hypertension   . Renal disorder   . Sepsis (HCC) 2015   urinary    There are no active problems to display for this patient.   History reviewed. No pertinent surgical history.   OB History    Gravida      Para      Term      Preterm      AB      Living  0     SAB      TAB      Ectopic      Multiple      Live Births               Home Medications    Prior to Admission medications   Medication Sig Start Date End Date Taking? Authorizing Provider  aspirin EC 81 MG tablet Take 81 mg by mouth daily.    [provider]  Black Cohosh 200 MG CAPS Take 2 capsules by mouth daily.    [provider]  docusate sodium (STOOL SOFTENER) 100 MG capsule Take 100 mg by mouth 2 (two) times daily.    [provider]  fexofenadine (ALLEGRA) 180 MG tablet Take 180 mg by mouth daily.    [provider]  lisinopril (PRINIVIL,ZESTRIL) 20 MG tablet Take 1 tablet (20 mg total) by mouth daily. 11/30/18   Ivery Quale, PA-C  loratadine  (CLARITIN) 10 MG tablet Take 10 mg by mouth daily.    [provider]  magnesium (MAGTAB) 84 MG ( ) TBCR SR tablet Take 84 mg by mouth daily.    [provider]  Naproxen Sodium (ALEVE PO) Take 1 tablet by mouth as needed.    [provider]  nitrofurantoin, macrocrystal-monohydrate, (MACROBID) 100 MG capsule Take 1 capsule (100 mg total) by mouth 2 (two) times daily. 11/30/18   Ivery Quale, PA-C  vitamin B-12 (CYANOCOBALAMIN) 250 MCG tablet Take 250 mcg by mouth daily.    [provider]    Family History Family History  Problem Relation Age of Onset  . Hepatitis Mother   . Cirrhosis Mother   . Diabetes Father   . Cancer Other     Social History Social History   Tobacco Use  . Smoking status: Never Smoker  . Smokeless tobacco: Never Used  Substance Use Topics  . Alcohol use: Never    Frequency: Never  . Drug use: Never     Allergies   Ciprofloxacin   Review of  Systems Review of Systems  Constitutional: Negative for fever.  Gastrointestinal: Negative for abdominal pain.  Genitourinary: Positive for dysuria. Negative for flank pain.  All other systems reviewed and are negative.    Physical Exam Updated Vital Signs BP 126/77   Pulse 83   Temp 97.9 F (36.6 C) (Temporal)   Resp 18   Ht 5\' 2"  (1.575 m)   Wt 113.4 kg   SpO2 97%   BMI 45.73 kg/m   Physical Exam Vitals signs and nursing note reviewed.  HENT:     Head: Normocephalic.     Nose: Nose normal.     Mouth/Throat:     Mouth: Mucous membranes are moist.  Eyes:     Pupils: Pupils are equal, round, and reactive to light.  Neck:     Musculoskeletal: Normal range of motion.  Cardiovascular:     Rate and Rhythm: Normal rate.     Pulses: Normal pulses.  Pulmonary:     Effort: Pulmonary effort is normal.  Abdominal:     General: Abdomen is flat.  Musculoskeletal: Normal range of motion.  Skin:    General: Skin is warm.  Neurological:     General: No focal  deficit present.     Mental Status: She is alert.  Psychiatric:        Mood and Affect: Mood normal.      ED Treatments / Results  Labs (all labs ordered are listed, but only abnormal results are displayed) Labs Reviewed  URINALYSIS, ROUTINE W REFLEX MICROSCOPIC - Abnormal; Notable for the following components:      Result Value   APPearance HAZY (*)    Hgb urine dipstick SMALL (*)    Leukocytes,Ua MODERATE (*)    WBC, UA >50 (*)    Bacteria, UA RARE (*)    All other components within normal limits  BASIC METABOLIC PANEL - Abnormal; Notable for the following components:   BUN 21 (*)    All other components within normal limits  CBC WITH DIFFERENTIAL/PLATELET - Abnormal; Notable for the following components:   MCH 25.2 (*)    MCHC 29.7 (*)    All other components within normal limits  PREGNANCY, URINE    EKG None  Radiology No results found.  Procedures Procedures (including critical care time)  Medications Ordered in ED Medications - No data to display   Initial Impression / Assessment and Plan / ED Course  I have reviewed the triage vital signs and the nursing notes.  Pertinent labs & imaging results that were available during my care of the patient were reviewed by me and considered in my medical decision making (see chart for details).        MDM   Urine shows wbc and red blood cells,  Ct scan shows multiple renal stones and gallstones   Final Clinical Impressions(s) / ED Diagnoses   Final diagnoses:  Urinary tract infection with hematuria, site unspecified  Renal calculus, right  Gallstones    ED Discharge Orders         Ordered    cephALEXin (KEFLEX) 500 MG capsule  4 times daily     12/08/18 1701    fluconazole (DIFLUCAN) 150 MG tablet  Daily     12/08/18 1710        An After Visit Summary was printed and given to the patient.    Elson Areas, PA-C 12/08/18 1715    Samuel Jester, DO 12/12/18 1844

## 2018-12-08 NOTE — ED Triage Notes (Signed)
Pt is here for a re-evaluation for "bubbly" urine. Denies dysuria, polyuria, or Gu sx.

## 2018-12-10 LAB — URINE CULTURE
Culture: NO GROWTH
Special Requests: NORMAL

## 2018-12-17 ENCOUNTER — Ambulatory Visit (INDEPENDENT_AMBULATORY_CARE_PROVIDER_SITE_OTHER): Payer: PRIVATE HEALTH INSURANCE | Admitting: Family Medicine

## 2018-12-17 ENCOUNTER — Encounter: Payer: Self-pay | Admitting: Family Medicine

## 2018-12-17 ENCOUNTER — Other Ambulatory Visit (HOSPITAL_COMMUNITY): Payer: Self-pay | Admitting: Family Medicine

## 2018-12-17 ENCOUNTER — Encounter (INDEPENDENT_AMBULATORY_CARE_PROVIDER_SITE_OTHER): Payer: Self-pay | Admitting: *Deleted

## 2018-12-17 ENCOUNTER — Other Ambulatory Visit: Payer: Self-pay

## 2018-12-17 VITALS — BP 130/80 | HR 104 | Ht 62.0 in | Wt 247.0 lb

## 2018-12-17 DIAGNOSIS — Z6841 Body Mass Index (BMI) 40.0 and over, adult: Secondary | ICD-10-CM

## 2018-12-17 DIAGNOSIS — Z1231 Encounter for screening mammogram for malignant neoplasm of breast: Secondary | ICD-10-CM

## 2018-12-17 DIAGNOSIS — I10 Essential (primary) hypertension: Secondary | ICD-10-CM

## 2018-12-17 DIAGNOSIS — N200 Calculus of kidney: Secondary | ICD-10-CM | POA: Diagnosis not present

## 2018-12-17 DIAGNOSIS — Z0100 Encounter for examination of eyes and vision without abnormal findings: Secondary | ICD-10-CM

## 2018-12-17 DIAGNOSIS — N951 Menopausal and female climacteric states: Secondary | ICD-10-CM

## 2018-12-17 DIAGNOSIS — J302 Other seasonal allergic rhinitis: Secondary | ICD-10-CM

## 2018-12-17 DIAGNOSIS — Z1211 Encounter for screening for malignant neoplasm of colon: Secondary | ICD-10-CM

## 2018-12-17 DIAGNOSIS — Z0120 Encounter for dental examination and cleaning without abnormal findings: Secondary | ICD-10-CM

## 2018-12-17 MED ORDER — LISINOPRIL 20 MG PO TABS: 20.0000 mg | ORAL_TABLET | Freq: Every day | ORAL | 1 refills | Status: DC

## 2018-12-17 NOTE — Addendum Note (Signed)
Addended by: Recardo Evangelist A on: 12/17/2018 03:14 PM   Modules accepted: Orders

## 2018-12-17 NOTE — Progress Notes (Signed)
New Patient Office Visit  Subjective:  Patient ID: Sarah Davila, female    DOB: 1961/08/23  Age: 58 y.o. MRN: [id number]  CC:  Chief Complaint  Patient presents with  . Establish Care    new pt appt    HPI Sarah Davila is a 58 year old female patient who presents today for new patient establishment in the practice.  Has not been seen previously here at all.  Is recently new to the area.  Used to live up closer to [facility] area.   Comes with current history of kidney stones (currently has a kidney stone and has been to the emergency room several times for this) has follow up with neurology tomorrow, hypertension (newly diagnosed in the emergency room was put on a prescription of lisinopril for this), bilateral cataract surgery in 2016.  Reports that she is taking all medications on her list as directed and or per bottle instructions.  She does take some supplements.  Reports that she needs a refill of the lisinopril that they provided with her in the emergency room.  Of note she rotates use of Claritin with Zyrtec every 6 months and Paraschos with Allegra.  Reports this helps her with her allergies.  Health maintenance: Has not had a mammogram in many years.  Has not had a colonoscopy.  Has not seen an eye doctor or dentist since moving down here.  Is unsure of when her last tetanus was.  And is declining the flu vaccine.  Social: Divorced lives alone no children of her own.  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  No history of having recent falls.  But did say that she slipped but went back into the chair.  Diet is consistent with enjoyment of ice cream, processed foods, reports he does not eat a lot of meats.  Reports that she struggles to try to work out.  But used to walk a lot.  And at one time weight 120 pounds.  Drinks 1 cup of coffee in the morning.  Reports wearing her seatbelt and sunscreen.  Overall feels good today in the office.  Reports that she actually never had discomfort  with her kidney stone.  Only thing she noticed was foaming and discoloration of her urine.  And going to the emergency room and they diagnosed her with a kidney stone.  She still has some vomiting but this is reduced recently.  He still has 1 more day of her Keflex left.  She has been on this for about 10 days now takes it 4 times daily.  Was originally on Macrobid and was changed.  Is unsure when her last work was drawn outside when she went to the emergency room.  Denies having any chest pain, shortness of breath, leg swelling, palpitations, chest tightness.  Denies having any signs and symptoms today in the office of UTI.  Does report that she still has some mild foaming of urine.  But overall doing well.  Past Medical, Surgical, Social History, Allergies, and Medications have been Reviewed.   Past Medical History:  Diagnosis Date  . Coronary artery disease   . Hypertension   . Renal disorder   . Sepsis (HCC) 2015   urinary    History reviewed. No pertinent surgical history.  Family History  Problem Relation Age of Onset  . Hepatitis Mother   . Cirrhosis Mother   . Diabetes Father   . Cancer Other     Social History   Socioeconomic  History  . Marital status: Single    Spouse name: Not on file  . Number of children: Not on file  . Years of education: Not on file  . Highest education level: Not on file  Occupational History  . Not on file  Social Needs  . Financial resource strain: Not on file  . Food insecurity:    Worry: Not on file    Inability: Not on file  . Transportation needs:    Medical: Not on file    Non-medical: Not on file  Tobacco Use  . Smoking status: Never Smoker  . Smokeless tobacco: Never Used  Substance and Sexual Activity  . Alcohol use: Never    Frequency: Never  . Drug use: Never  . Sexual activity: Yes  Lifestyle  . Physical activity:    Days per week: Not on file    Minutes per session: Not on file  . Stress: Not on file   Relationships  . Social connections:    Talks on phone: Not on file    Gets together: Not on file    Attends religious service: Not on file    Active member of club or organization: Not on file    Attends meetings of clubs or organizations: Not on file    Relationship status: Not on file  . Intimate partner violence:    Fear of current or ex partner: Not on file    Emotionally abused: Not on file    Physically abused: Not on file    Forced sexual activity: Not on file  Other Topics Concern  . Not on file  Social History Narrative  . Not on file    ROS Review of Systems  Constitutional: Negative.   HENT: Negative.   Eyes: Negative.   Respiratory: Negative.  Negative for choking, chest tightness and shortness of breath.   Cardiovascular: Negative.  Negative for chest pain, palpitations and leg swelling.  Gastrointestinal: Negative.   Endocrine: Negative.   Genitourinary: Negative for difficulty urinating, dyspareunia, dysuria, flank pain, frequency and urgency.       Reports still having foamy and slightly discolored urine.  Denies having any signs or symptoms of UTI today in the office.  Musculoskeletal: Negative.   Skin: Negative.   Allergic/Immunologic: Negative.   Neurological: Negative.   Hematological: Negative.   Psychiatric/Behavioral: Negative.   All other systems reviewed and are negative.   Objective:   Today's Vitals: BP 130/80   Pulse (!) 104   Ht 5\' 2"  (1.575 m)   Wt 247 lb (112 kg)   SpO2 97%   BMI 45.18 kg/m   Physical Exam Vitals signs and nursing note reviewed.  Constitutional:      Appearance: Normal appearance. She is obese.  HENT:     Head: Normocephalic.     Right Ear: External ear normal.     Left Ear: External ear normal.     Nose: Nose normal.     Mouth/Throat:     Mouth: Mucous membranes are moist.     Pharynx: Oropharynx is clear.  Eyes:     General:        Right eye: No discharge.        Left eye: No discharge.      Conjunctiva/sclera: Conjunctivae normal.  Neck:     Musculoskeletal: Normal range of motion and neck supple.  Cardiovascular:     Rate and Rhythm: Normal rate and regular rhythm.     Pulses: Normal pulses.  Heart sounds: Normal heart sounds.  Pulmonary:     Effort: Pulmonary effort is normal.     Breath sounds: Normal breath sounds.  Abdominal:     General: Bowel sounds are normal.     Palpations: Abdomen is soft.  Skin:    General: Skin is warm and dry.     Comments: Noted visible tattoo on left wrist.  Neurological:     Mental Status: She is alert and oriented to person, place, and time.  Psychiatric:        Mood and Affect: Mood normal.        Behavior: Behavior normal.        Thought Content: Thought content normal.        Judgment: Judgment normal.     Assessment & Plan:   1. Morbid obesity with BMI of 45.0-49.9, adult [facility]) Discussed education today regarding diet choices.  Advised to follow a low-fat low-cholesterol diet Lowfat, low cholesterol diet was reviewed.  Encouraged patient to limit red meats, egg yolks, cheese and creamy items such as ice cream, creamy sauces, soups and dressings, substituting instead with oil-based salad dressings, broth-based soups, etc.  Avoid fatty/greasy foods and sweets. Further encouraged patient to workout 30 minutes of moderate exercise suggested walking daily.  Would like to see her up to 5 days a week 30 minutes of exercise.  For total of 150 minutes weekly.   2. Essential hypertension Blood pressure is in range currently today.  Reports that she is taking the lisinopril since she has had it filled.  Will be bringing her back in and monitoring this.  Currently controlled.  Also educated on the need for cardiovascular benefits of exercise.  Please see above for recommendation.  - lisinopril (PRINIVIL,ZESTRIL) 20 MG tablet; Take 1 tablet (20 mg total) by mouth daily.  Dispense: 30 tablet; Refill: 1  3. Seasonal allergies Reports  that she takes Allegra, Claritin, Zyrtec.  With Claritin and Zyrtec rotating every 6 months.  Reports that she does not have any allergies to day that are controlled currently with her medication regime.  4. Kidney stones Currently has a kidney stone and has been to the emergency room several times with her.  Is about to finish her Keflex she has 1 more day of this.  We will continue to follow and appreciate collaboration with her care.  5. Menopausal hot flushes Reports that she has bad hot flashes secondary to menopause.  Is on black cohosh and feels that these worked very well for her.  Educated her the use of supplementation and more reactions to prescription medications.  She understands this.  We will continue to medication for now.   Follow-up: 3 months.  Fasting labs 1 week before.  Freddy Finner, NP

## 2018-12-17 NOTE — Addendum Note (Signed)
Addended by: Recardo Evangelist A on: 12/17/2018 04:12 PM   Modules accepted: Orders

## 2018-12-17 NOTE — Patient Instructions (Addendum)
    Thank you for coming into the office today. I appreciate the opportunity to provide you with the care for your health and wellness.  Continue taking Lisinopril as directed. We sent in a refill of this. Would like to see you back in 3 months for follow up on labs, blood pressure, and weight check.  Would like yo encourage you to start walking again. 30 minutes daily. Work to get a total of 150 minutes weekly of moderate pace exercise.  Would like you to be more mindful of your food choices to help promote weight loss.  Would like you to get your labs 1 week prior to your appt. please be fasting at these labs.  As your lipid level needs to be drawn in a fasting state. The lab is on the same floor as our office. It is at the end of the hall on the Right when you come off the elevator.   We will review these at your next appt.   I have ordered a mammogram and colonoscopy for you. We will also look into eye doctors and dentist.   It was a pleasure to see you and I look forward to continuing to work together on your health and well-being. Please do not hesitate to call the office if you need care or have questions about your care.  Have a wonderful day and week.  With Gratitude,  Tereasa Coop, DNP, AGNP-BC

## 2018-12-18 ENCOUNTER — Ambulatory Visit: Payer: PRIVATE HEALTH INSURANCE | Admitting: Urology

## 2018-12-18 ENCOUNTER — Other Ambulatory Visit: Payer: Self-pay | Admitting: Emergency Medicine

## 2018-12-18 ENCOUNTER — Other Ambulatory Visit (HOSPITAL_COMMUNITY): Payer: Self-pay | Admitting: Emergency Medicine

## 2018-12-18 DIAGNOSIS — Z87440 Personal history of urinary (tract) infections: Secondary | ICD-10-CM | POA: Diagnosis not present

## 2018-12-18 DIAGNOSIS — N200 Calculus of kidney: Secondary | ICD-10-CM | POA: Diagnosis not present

## 2018-12-18 DIAGNOSIS — N201 Calculus of ureter: Secondary | ICD-10-CM

## 2018-12-21 ENCOUNTER — Other Ambulatory Visit: Payer: Self-pay | Admitting: Urology

## 2018-12-21 DIAGNOSIS — N201 Calculus of ureter: Secondary | ICD-10-CM

## 2018-12-23 ENCOUNTER — Other Ambulatory Visit: Payer: Self-pay

## 2018-12-23 ENCOUNTER — Ambulatory Visit (HOSPITAL_COMMUNITY)
Admission: RE | Admit: 2018-12-23 | Discharge: 2018-12-23 | Disposition: A | Discharge status: Home / Self Care | Payer: PRIVATE HEALTH INSURANCE | Source: Ambulatory Visit | Attending: Urology | Admitting: Urology

## 2018-12-23 ENCOUNTER — Other Ambulatory Visit (HOSPITAL_COMMUNITY): Payer: Self-pay | Admitting: Urology

## 2018-12-23 DIAGNOSIS — N201 Calculus of ureter: Secondary | ICD-10-CM

## 2018-12-23 DIAGNOSIS — N200 Calculus of kidney: Secondary | ICD-10-CM

## 2018-12-23 DIAGNOSIS — I10 Essential (primary) hypertension: Secondary | ICD-10-CM

## 2018-12-23 MED ORDER — LISINOPRIL 20 MG PO TABS: 20.0000 mg | ORAL_TABLET | Freq: Every day | ORAL | 1 refills | Status: DC

## 2018-12-25 ENCOUNTER — Other Ambulatory Visit: Payer: Self-pay

## 2018-12-25 ENCOUNTER — Other Ambulatory Visit (HOSPITAL_COMMUNITY)
Admission: AD | Admit: 2018-12-25 | Discharge: 2018-12-25 | Disposition: A | Discharge status: Home / Self Care | Payer: PRIVATE HEALTH INSURANCE | Source: Other Acute Inpatient Hospital | Attending: Urology | Admitting: Urology

## 2018-12-25 ENCOUNTER — Ambulatory Visit: Payer: PRIVATE HEALTH INSURANCE | Admitting: Urology

## 2018-12-25 DIAGNOSIS — R311 Benign essential microscopic hematuria: Secondary | ICD-10-CM | POA: Diagnosis not present

## 2018-12-25 DIAGNOSIS — N200 Calculus of kidney: Secondary | ICD-10-CM | POA: Diagnosis not present

## 2018-12-25 DIAGNOSIS — N201 Calculus of ureter: Secondary | ICD-10-CM | POA: Diagnosis not present

## 2019-01-05 ENCOUNTER — Ambulatory Visit (HOSPITAL_COMMUNITY): Payer: PRIVATE HEALTH INSURANCE

## 2019-01-05 ENCOUNTER — Encounter (HOSPITAL_COMMUNITY): Payer: PRIVATE HEALTH INSURANCE

## 2019-01-08 LAB — CALCULI, WITH PHOTOGRAPH (CLINICAL LAB)
Calcium Oxalate Dihydrate: 50 %
Calcium Oxalate Monohydrate: 45 %
Hydroxyapatite: 5 %
Weight Calculi: 36 mg

## 2019-01-08 LAB — CALCULI, WITH PHOTOGRAPH

## 2019-01-13 ENCOUNTER — Other Ambulatory Visit: Payer: Self-pay | Admitting: Urology

## 2019-01-13 ENCOUNTER — Other Ambulatory Visit (HOSPITAL_COMMUNITY): Payer: Self-pay | Admitting: Urology

## 2019-01-13 DIAGNOSIS — N201 Calculus of ureter: Secondary | ICD-10-CM

## 2019-01-13 DIAGNOSIS — N200 Calculus of kidney: Secondary | ICD-10-CM

## 2019-02-02 ENCOUNTER — Ambulatory Visit (HOSPITAL_COMMUNITY)
Admission: RE | Admit: 2019-02-02 | Discharge: 2019-02-02 | Disposition: A | Discharge status: Home / Self Care | Payer: PRIVATE HEALTH INSURANCE | Source: Ambulatory Visit | Attending: Urology | Admitting: Urology

## 2019-02-02 ENCOUNTER — Other Ambulatory Visit: Payer: Self-pay

## 2019-02-02 DIAGNOSIS — N200 Calculus of kidney: Secondary | ICD-10-CM | POA: Diagnosis present

## 2019-02-02 DIAGNOSIS — N201 Calculus of ureter: Secondary | ICD-10-CM

## 2019-02-03 ENCOUNTER — Encounter (HOSPITAL_COMMUNITY): Payer: Self-pay

## 2019-02-03 ENCOUNTER — Other Ambulatory Visit: Payer: Self-pay

## 2019-02-03 ENCOUNTER — Other Ambulatory Visit: Payer: Self-pay | Admitting: Urology

## 2019-02-03 NOTE — H&P (View-Only) (Signed)
CC: I have kidney stones.  HPI: Sarah Davila is a 58 year-old female established patient who is here for renal calculi.    Sarah Davila returns today in f/u. she has not had right flank pain, hematuria or fever but her urine has gotten foamy again. She has no irritative symptoms. She has not seen a stone pass. A KUB prior to the visit was read as just renal stones but I think there is still a 2 mm right proximal stone but the hydro has resolved. She remains on a suppressive antibiotic but her UA is still abnormal. I will send a culture and micro.    GU Hx: Sarah Davila is a 58 yo female who was seen in the ER x 3 over the last month with the last on 12/08/18. She initially seen in [place] initially for concerns about sepsis because of a prior experience with that 5 years ago. She was initially give keflex with no improvement and went back after 7 days to [facility] and had a UA and had e. coli on the culture 11/26/18 and was given Rocephin, diflucan and macrobid for a week. She came back on 12/08/18 for dark, cloudy, malodorous urine. She had a CT that showed a 35mm left proximal stone with minimal obstruction and bilateral renal stones with the largest 32mm in the left renal pelvis without obstruction. She had repeat cultures on 2/27 and 3/3 that were negative. She was then given keflex and has been on that for 10 days QID. She has had a rash on her hand and legs and her lips swelled. Her urine has reduced bubbles. She has had no fever, hematuria or nausea. She hasn't seen a stone passed.     ALLERGIES: Ciprofloxacin    MEDICATIONS: Aspirin  Lisinopril  Zyrtec  Aleve  Allegra Allergy  Black Cohosh  Glucosamine & Chondroitin  Stool Softner  Vitamin B12     GU PSH: Cysto Uretero Lithotripsy, Left - about 2015 PCNL, Right - about 2015      PSH Notes: kidney stones   NON-GU PSH: None   GU PMH: Personal Hx Urinary Tract Infections, UA and culture today. I will keep her on suppressive keflex pending  resolution of the ureteral stone. - 12/18/2018 Renal calculus, She has bilateral renal stones with the largest 21mm in the left renal pelvis. I discussed treatment including ESWL, URS and PCNL but will defer that decision depending on whether the right ureteral stone passes. - 12/18/2018 Ureteral calculus, She has a minimally symptomatic right ureteral stone and I will have her get a KUB and renal US next week and f/u with the results. - 12/18/2018    NON-GU PMH: Cholelithiasis Hypertension    FAMILY HISTORY: None   SOCIAL HISTORY: Marital Status: Single Preferred Language: English Current Smoking Status: Patient has never smoked.   Tobacco Use Assessment Completed: Used Tobacco in last 30 days? Has never drank.  Drinks 1 caffeinated drink per day. 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    REVIEW OF SYSTEMS:    GU Review Female:   Patient reports get up at night to urinate. Patient denies frequent urination, hard to postpone urination, burning /pain with urination, leakage of urine, stream starts and stops, trouble starting your stream, have to strain to urinate, and being pregnant.  Gastrointestinal (Upper):   Patient denies nausea, vomiting, and indigestion/ heartburn.  Gastrointestinal (Lower):   Patient denies diarrhea and constipation.  Constitutional:   Patient reports night sweats. Patient denies fever, weight loss, and fatigue.  Skin:   Patient denies skin rash/ lesion and itching.  Eyes:   Patient denies blurred vision and double vision.  Ears/ Nose/ Throat:   Patient reports sore throat. Patient denies sinus problems.  Hematologic/Lymphatic:   Patient denies swollen glands and easy bruising.  Cardiovascular:   Patient denies leg swelling and chest pains.  Respiratory:   Patient denies cough and shortness of breath.  Endocrine:   Patient denies excessive thirst.  Musculoskeletal:   Patient reports joint pain. Patient denies back pain.  Neurological:   Patient denies dizziness and  headaches.  Psychologic:   Patient denies depression and anxiety.   VITAL SIGNS:      12/25/2018 02:11 PM  Weight 247 lb / 112.04 kg  Height 62 in / 157.48 cm  BP 161/70 mmHg  Pulse 105 /min  Temperature 98.0 F / 36.6 C  BMI 45.2 kg/m   PAST DATA REVIEWED:  Source Of History:  Patient  Urine Test Review:   Urinalysis  X-Ray Review: KUB: Reviewed Films. Reviewed Report. Discussed With Patient.  Renal Ultrasound: Reviewed Films. Reviewed Report. Discussed With Patient.     PROCEDURES: None   ASSESSMENT:      ICD-10 Details  1 GU:   Ureteral calculus - N20.1 Radiology didn't feel they saw the ureteral stone but I think it may be still in the proximal ureter. Since she no longer has hydro, I will have her return in 1 months with a CT stone study to clarify the situation.   2   Renal calculus - N20.0   3   Microscopic hematuria - R31.1 She remains on a suppressive anitbiotic. I will send her UA for micro and culture. A culture didn't get done at the last visit.      PLAN:           Orders Labs Urinalysis w/Scope, CULTURE, URINE, Stone Analysis          Schedule X-Rays: 1 Month - C.T. Stone Protocol Without Contrast  Return Visit/Planned Activity: 1 Month - Office Visit          Document Letter(s):  Created for Patient: Clinical Summary    She had a CT on 02/02/19 that shows a 5mm obstructing right proximal stone with smaller renal stones and a 12mm left renal pelvic stone without obstruction.   She remains on a suppressive dose of keflex but did have a transient fever a couple of days ago.   I am going to set up for cystoscopy with right stent insertion and will do bilateral URS in a week or two.   

## 2019-02-03 NOTE — H&P (Signed)
CC: I have kidney stones.  HPI: Sarah Davila is a 58 year-old female established patient who is here for renal calculi.    Sarah Davila returns today in f/u. she has not had right flank pain, hematuria or fever but her urine has gotten foamy again. She has no irritative symptoms. She has not seen a stone pass. A KUB prior to the visit was read as just renal stones but I think there is still a 2 mm right proximal stone but the hydro has resolved. She remains on a suppressive antibiotic but her UA is still abnormal. I will send a culture and micro.    GU Hx: Sarah Davila is a 58 yo female who was seen in the ER x 3 over the last month with the last on 12/08/18. She initially seen in [place] initially for concerns about sepsis because of a prior experience with that 5 years ago. She was initially give keflex with no improvement and went back after 7 days to [facility] and had a UA and had e. coli on the culture 11/26/18 and was given Rocephin, diflucan and macrobid for a week. She came back on 12/08/18 for dark, cloudy, malodorous urine. She had a CT that showed a 35mm left proximal stone with minimal obstruction and bilateral renal stones with the largest 32mm in the left renal pelvis without obstruction. She had repeat cultures on 2/27 and 3/3 that were negative. She was then given keflex and has been on that for 10 days QID. She has had a rash on her hand and legs and her lips swelled. Her urine has reduced bubbles. She has had no fever, hematuria or nausea. She hasn't seen a stone passed.     ALLERGIES: Ciprofloxacin    MEDICATIONS: Aspirin  Lisinopril  Zyrtec  Aleve  Allegra Allergy  Black Cohosh  Glucosamine & Chondroitin  Stool Softner  Vitamin B12     GU PSH: Cysto Uretero Lithotripsy, Left - about 2015 PCNL, Right - about 2015      PSH Notes: kidney stones   NON-GU PSH: None   GU PMH: Personal Hx Urinary Tract Infections, UA and culture today. I will keep her on suppressive keflex pending  resolution of the ureteral stone. - 12/18/2018 Renal calculus, She has bilateral renal stones with the largest 21mm in the left renal pelvis. I discussed treatment including ESWL, URS and PCNL but will defer that decision depending on whether the right ureteral stone passes. - 12/18/2018 Ureteral calculus, She has a minimally symptomatic right ureteral stone and I will have her get a KUB and renal US next week and f/u with the results. - 12/18/2018    NON-GU PMH: Cholelithiasis Hypertension    FAMILY HISTORY: None   SOCIAL HISTORY: Marital Status: Single Preferred Language: English Current Smoking Status: Patient has never smoked.   Tobacco Use Assessment Completed: Used Tobacco in last 30 days? Has never drank.  Drinks 1 caffeinated drink per day. 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    REVIEW OF SYSTEMS:    GU Review Female:   Patient reports get up at night to urinate. Patient denies frequent urination, hard to postpone urination, burning /pain with urination, leakage of urine, stream starts and stops, trouble starting your stream, have to strain to urinate, and being pregnant.  Gastrointestinal (Upper):   Patient denies nausea, vomiting, and indigestion/ heartburn.  Gastrointestinal (Lower):   Patient denies diarrhea and constipation.  Constitutional:   Patient reports night sweats. Patient denies fever, weight loss, and fatigue.  Skin:   Patient denies skin rash/ lesion and itching.  Eyes:   Patient denies blurred vision and double vision.  Ears/ Nose/ Throat:   Patient reports sore throat. Patient denies sinus problems.  Hematologic/Lymphatic:   Patient denies swollen glands and easy bruising.  Cardiovascular:   Patient denies leg swelling and chest pains.  Respiratory:   Patient denies cough and shortness of breath.  Endocrine:   Patient denies excessive thirst.  Musculoskeletal:   Patient reports joint pain. Patient denies back pain.  Neurological:   Patient denies dizziness and  headaches.  Psychologic:   Patient denies depression and anxiety.   VITAL SIGNS:      12/25/2018 02:11 PM  Weight 247 lb / 112.04 kg  Height 62 in / 157.48 cm  BP 161/70 mmHg  Pulse 105 /min  Temperature 98.0 F / 36.6 C  BMI 45.2 kg/m   PAST DATA REVIEWED:  Source Of History:  Patient  Urine Test Review:   Urinalysis  X-Ray Review: KUB: Reviewed Films. Reviewed Report. Discussed With Patient.  Renal Ultrasound: Reviewed Films. Reviewed Report. Discussed With Patient.     PROCEDURES: None   ASSESSMENT:      ICD-10 Details  1 GU:   Ureteral calculus - N20.1 Radiology didn't feel they saw the ureteral stone but I think it may be still in the proximal ureter. Since she no longer has hydro, I will have her return in 1 months with a CT stone study to clarify the situation.   2   Renal calculus - N20.0   3   Microscopic hematuria - R31.1 She remains on a suppressive anitbiotic. I will send her UA for micro and culture. A culture didn't get done at the last visit.      PLAN:           Orders Labs Urinalysis w/Scope, CULTURE, URINE, Stone Analysis          Schedule X-Rays: 1 Month - C.T. Stone Protocol Without Contrast  Return Visit/Planned Activity: 1 Month - Office Visit          Document Letter(s):  Created for Patient: Clinical Summary    She had a CT on 02/02/19 that shows a 5mm obstructing right proximal stone with smaller renal stones and a 12mm left renal pelvic stone without obstruction.   She remains on a suppressive dose of keflex but did have a transient fever a couple of days ago.   I am going to set up for cystoscopy with right stent insertion and will do bilateral URS in a week or two.

## 2019-02-04 ENCOUNTER — Ambulatory Visit (HOSPITAL_COMMUNITY): Payer: PRIVATE HEALTH INSURANCE | Admitting: Anesthesiology

## 2019-02-04 ENCOUNTER — Encounter (HOSPITAL_COMMUNITY)
Admission: RE | Admit: 2019-02-04 | Discharge: 2019-02-04 | Disposition: A | Discharge status: Home / Self Care | Payer: PRIVATE HEALTH INSURANCE | Source: Ambulatory Visit | Attending: Urology | Admitting: Urology

## 2019-02-04 ENCOUNTER — Other Ambulatory Visit: Payer: Self-pay

## 2019-02-04 ENCOUNTER — Ambulatory Visit (HOSPITAL_COMMUNITY): Payer: PRIVATE HEALTH INSURANCE

## 2019-02-04 ENCOUNTER — Encounter (HOSPITAL_COMMUNITY)
Admission: RE | Disposition: A | Discharge status: Home / Self Care | Payer: Self-pay | Source: Home / Self Care | Attending: Urology

## 2019-02-04 ENCOUNTER — Encounter (HOSPITAL_COMMUNITY): Payer: Self-pay | Admitting: *Deleted

## 2019-02-04 ENCOUNTER — Ambulatory Visit (HOSPITAL_COMMUNITY)
Admission: RE | Admit: 2019-02-04 | Discharge: 2019-02-04 | Disposition: A | Discharge status: Home / Self Care | Payer: PRIVATE HEALTH INSURANCE | Attending: Urology | Admitting: Urology

## 2019-02-04 DIAGNOSIS — I10 Essential (primary) hypertension: Secondary | ICD-10-CM | POA: Insufficient documentation

## 2019-02-04 DIAGNOSIS — Z79899 Other long term (current) drug therapy: Secondary | ICD-10-CM | POA: Insufficient documentation

## 2019-02-04 DIAGNOSIS — Z7982 Long term (current) use of aspirin: Secondary | ICD-10-CM | POA: Diagnosis not present

## 2019-02-04 DIAGNOSIS — Z881 Allergy status to other antibiotic agents status: Secondary | ICD-10-CM | POA: Diagnosis not present

## 2019-02-04 DIAGNOSIS — N3031 Trigonitis with hematuria: Secondary | ICD-10-CM | POA: Diagnosis not present

## 2019-02-04 DIAGNOSIS — Z6841 Body Mass Index (BMI) 40.0 and over, adult: Secondary | ICD-10-CM | POA: Insufficient documentation

## 2019-02-04 DIAGNOSIS — N200 Calculus of kidney: Secondary | ICD-10-CM | POA: Diagnosis not present

## 2019-02-04 DIAGNOSIS — N202 Calculus of kidney with calculus of ureter: Secondary | ICD-10-CM | POA: Insufficient documentation

## 2019-02-04 HISTORY — PX: CYSTOSCOPY W/ URETERAL STENT PLACEMENT: SHX1429

## 2019-02-04 SURGERY — CYSTOSCOPY, WITH RETROGRADE PYELOGRAM AND URETERAL STENT INSERTION
Anesthesia: General | Site: Ureter | Laterality: Bilateral

## 2019-02-04 MED ORDER — MIDAZOLAM HCL 5 MG/5ML IJ SOLN
INTRAMUSCULAR | Status: DC | PRN
  Administered 2019-02-04: 2 mg via INTRAVENOUS

## 2019-02-04 MED ORDER — FLUCONAZOLE 150 MG PO TABS: 150.0000 mg | ORAL_TABLET | Freq: Every day | ORAL | 1 refills | Status: DC

## 2019-02-04 MED ORDER — OXYCODONE-ACETAMINOPHEN 5-325 MG PO TABS: 1.0000 | ORAL_TABLET | Freq: Four times a day (QID) | ORAL | 0 refills | Status: DC | PRN

## 2019-02-04 MED ORDER — FENTANYL CITRATE (PF) 250 MCG/5ML IJ SOLN
INTRAMUSCULAR | Status: AC
  Filled 2019-02-04: qty 5

## 2019-02-04 MED ORDER — STERILE WATER FOR IRRIGATION IR SOLN
Status: DC | PRN
  Administered 2019-02-04: 500 mL

## 2019-02-04 MED ORDER — ROCURONIUM BROMIDE 10 MG/ML (PF) SYRINGE
PREFILLED_SYRINGE | INTRAVENOUS | Status: AC
  Filled 2019-02-04: qty 10

## 2019-02-04 MED ORDER — PROPOFOL 10 MG/ML IV BOLUS
INTRAVENOUS | Status: DC | PRN
  Administered 2019-02-04: 150 mg via INTRAVENOUS

## 2019-02-04 MED ORDER — TAMSULOSIN HCL 0.4 MG PO CAPS: 0.4000 mg | ORAL_CAPSULE | Freq: Every day | ORAL | 1 refills | Status: DC

## 2019-02-04 MED ORDER — PROPOFOL 10 MG/ML IV BOLUS
INTRAVENOUS | Status: AC
  Filled 2019-02-04: qty 20

## 2019-02-04 MED ORDER — SUGAMMADEX SODIUM 500 MG/5ML IV SOLN
INTRAVENOUS | Status: DC | PRN
  Administered 2019-02-04: 200 mg via INTRAVENOUS

## 2019-02-04 MED ORDER — SUCCINYLCHOLINE 20MG/ML (10ML) SYRINGE FOR MEDFUSION PUMP - OPTIME
INTRAMUSCULAR | Status: DC | PRN
  Administered 2019-02-04: 120 mg via INTRAVENOUS

## 2019-02-04 MED ORDER — FENTANYL CITRATE (PF) 100 MCG/2ML IJ SOLN
INTRAMUSCULAR | Status: DC | PRN
  Administered 2019-02-04 (×2): 50 ug via INTRAVENOUS

## 2019-02-04 MED ORDER — ROCURONIUM 10MG/ML (10ML) SYRINGE FOR MEDFUSION PUMP - OPTIME
INTRAVENOUS | Status: DC | PRN
  Administered 2019-02-04: 8 mg via INTRAVENOUS
  Administered 2019-02-04: 25 mg via INTRAVENOUS

## 2019-02-04 MED ORDER — DEXAMETHASONE SODIUM PHOSPHATE 10 MG/ML IJ SOLN
INTRAMUSCULAR | Status: DC | PRN
  Administered 2019-02-04: 8 mg via INTRAVENOUS

## 2019-02-04 MED ORDER — SODIUM CHLORIDE 0.9 % IR SOLN
Status: DC | PRN
  Administered 2019-02-04: 3000 mL

## 2019-02-04 MED ORDER — DIATRIZOATE MEGLUMINE 30 % UR SOLN
URETHRAL | Status: AC
  Filled 2019-02-04: qty 100

## 2019-02-04 MED ORDER — MIDAZOLAM HCL 2 MG/2ML IJ SOLN
INTRAMUSCULAR | Status: AC
  Filled 2019-02-04: qty 2

## 2019-02-04 MED ORDER — ONDANSETRON HCL 4 MG/2ML IJ SOLN
INTRAMUSCULAR | Status: DC | PRN
  Administered 2019-02-04: 4 mg via INTRAVENOUS

## 2019-02-04 MED ORDER — SUCCINYLCHOLINE CHLORIDE 200 MG/10ML IV SOSY
PREFILLED_SYRINGE | INTRAVENOUS | Status: AC
  Filled 2019-02-04: qty 10

## 2019-02-04 MED ORDER — NYSTATIN 100000 UNIT/GM EX POWD: Freq: Three times a day (TID) | CUTANEOUS | 1 refills | Status: DC

## 2019-02-04 MED ORDER — PHENAZOPYRIDINE HCL 200 MG PO TABS: 200.0000 mg | ORAL_TABLET | Freq: Three times a day (TID) | ORAL | 1 refills | Status: DC | PRN

## 2019-02-04 MED ORDER — OXYBUTYNIN CHLORIDE 5 MG PO TABS: 5.0000 mg | ORAL_TABLET | Freq: Three times a day (TID) | ORAL | 1 refills | Status: DC | PRN

## 2019-02-04 MED ORDER — CEFAZOLIN SODIUM-DEXTROSE 2-4 GM/100ML-% IV SOLN
2.0000 g | INTRAVENOUS | Status: AC
  Administered 2019-02-04: 2 g via INTRAVENOUS
  Filled 2019-02-04: qty 100

## 2019-02-04 MED ORDER — LACTATED RINGERS IV SOLN
INTRAVENOUS | Status: DC
  Administered 2019-02-04: 1000 mL via INTRAVENOUS

## 2019-02-04 MED ORDER — LIDOCAINE HCL URETHRAL/MUCOSAL 2 % EX GEL
CUTANEOUS | Status: AC
  Filled 2019-02-04: qty 10

## 2019-02-04 MED ORDER — DIATRIZOATE MEGLUMINE 30 % UR SOLN
URETHRAL | Status: DC | PRN
  Administered 2019-02-04: 11:00:00 12 mL via URETHRAL

## 2019-02-04 SURGICAL SUPPLY — 18 items
BAG DRAIN URO TABLE W/ADPT NS (BAG) ×3 IMPLANT
CATH URET 5FR 28IN OPEN ENDED (CATHETERS) ×3 IMPLANT
CLOTH BEACON ORANGE TIMEOUT ST (SAFETY) ×6 IMPLANT
GLOVE BIOGEL PI IND STRL 6.5 (GLOVE) ×2 IMPLANT
GLOVE BIOGEL PI IND STRL 7.0 (GLOVE) ×1 IMPLANT
GLOVE BIOGEL PI INDICATOR 6.5 (GLOVE) ×4
GLOVE BIOGEL PI INDICATOR 7.0 (GLOVE) ×2
GLOVE SURG SS PI 8.0 STRL IVOR (GLOVE) ×3 IMPLANT
GUIDEWIRE STR DUAL SENSOR (WIRE) ×6 IMPLANT
IV NS IRRIG 3000ML ARTHROMATIC (IV SOLUTION) ×3 IMPLANT
KIT TURNOVER CYSTO (KITS) ×3 IMPLANT
MANIFOLD NEPTUNE II (INSTRUMENTS) ×3 IMPLANT
PACK CYSTO (CUSTOM PROCEDURE TRAY) ×3 IMPLANT
PAD ARMBOARD 7.5X6 YLW CONV (MISCELLANEOUS) ×3 IMPLANT
STENT URET 6FRX24 CONTOUR (STENTS) ×6 IMPLANT
TOWEL OR 17X26 4PK STRL BLUE (TOWEL DISPOSABLE) ×3 IMPLANT
WATER STERILE IRR 1000ML POUR (IV SOLUTION) ×3 IMPLANT
WATER STERILE IRR 3000ML UROMA (IV SOLUTION) ×6 IMPLANT

## 2019-02-04 NOTE — Transfer of Care (Signed)
Immediate Anesthesia Transfer of Care Note  Patient: Sarah Davila  Procedure(s) Performed: CYSTOSCOPY WITH RETROGRADE PYELOGRAM/URETERAL STENT PLACEMENT (Bilateral Ureter)  Patient Location: PACU  Anesthesia Type:General  Level of Consciousness: awake, alert  and oriented  Airway & Oxygen Therapy: Patient Spontanous Breathing  Post-op Assessment: Report given to RN  Post vital signs: Reviewed and stable  Last Vitals:  Vitals Value Taken Time  BP 134/64 02/04/2019 11:15 AM  Temp    Pulse 79 02/04/2019 11:17 AM  Resp 21 02/04/2019 11:17 AM  SpO2 100 % 02/04/2019 11:17 AM  Vitals shown include unvalidated device data.  Last Pain:  Vitals:   02/04/19 1112  TempSrc:   PainSc: (P) 0-No pain      Patients Stated Pain Goal: 5 (02/04/19 4765)  Complications: No apparent anesthesia complications

## 2019-02-04 NOTE — Op Note (Signed)
Procedure: 1.  Cystoscopy with bilateral retrograde pyelograms and interpretation. 2.  Insertion of bilateral double-J stents.  Preop diagnosis: Right proximal ureteral stone and left renal pelvic stone.  Postop diagnosis: Same with follicular cystitis.  Surgeon: Dr. Bjorn Pippin.  Anesthesia: General.  Specimen: Urine culture from right renal pelvis.  Drains: Bilateral 6 French by 24 cm contour double-J stent.  EBL: None.  Complications: None.  Indications: Sarah Davila is a 58 year old female who has a 5 mm right proximal stone with obstruction and she has had associated urinary tract infection.  She is currently on antibiotic suppression with Keflex.  She also has approximately 10 to 12 mm left renal pelvic stone without obstruction.  She has undergo cystoscopy with placement of ureteral stents in preparation for bilateral ureteroscopy in approximately 2 weeks.  It was felt pre-stenting was indicated because of the right obstruction and the presence of infection.  Procedure: She was taken operating room where she was given Ancef.  A general anesthetic was induced.  She was placed in lithotomy position and fitted with PAS hose.  Her perineum and genitalia were prepped with Betadine solution and she was draped in usual sterile fashion.  Cystoscopy was performed using a 23 [place] scope and 30 degree lens.  Examination revealed a normal urethra.  On bladder entry the urine was cloudy and the bladder was drained and irrigated.  The bladder wall was smooth and pale but there were small round fleshy lesions consistent with follicular cystitis.  No tumors or stones were seen.  Ureteral orifice ease were unremarkable.  The right ureteral orifice was cannulated with a 5 French opening catheter and contrast was instilled.  The right retrograde pyelogram demonstrated a normal ureter up to a small filling defect in the proximal ureter consistent with a stone with proximal hydronephrosis.  A guidewire was  passed through the open-ended catheter to the kidney and then the open-ended catheter was advanced over the wire into the renal pelvis.  The wire was removed and a brisk hydronephrotic drip was noted with some cloudy urine.  A specimen was obtained for culture.  The sensor wire was reinserted through the open-ended catheter.  The open-ended catheter was removed and a 6 [place] by 24 cm contour double-J stent was passed to the kidney over the wire under fluoroscopic guidance.  The wire was removed, leaving a good coil in the kidney and a good coil in the bladder.  The left ureteral orifice was cannulated with a 5 [place] open-ended catheter and contrast was instilled.  The left retrograde pyelogram demonstrated a normal ureter up to a decompressed kidney with a filling defect in the renal pelvis consistent with her known stone.  The sensor guidewire was then advanced to the open-ended catheter to the kidney and the open-ended catheter was removed.  A second 6 [place] by 24 cm contour double-J stent was advanced over the wire to the kidney under fluoroscopic guidance.  The wire was removed, leaving a good coil in the kidney and a good coil in the bladder.  The bladder was then partially drained and the cystoscope was removed.  She was taken down from lithotomy position, her anesthetic was reversed and she was moved recovery in stable condition.  There were no complications.

## 2019-02-04 NOTE — Anesthesia Preprocedure Evaluation (Addendum)
Anesthesia Evaluation  Patient identified by MRN, date of birth, ID band Patient awake    Reviewed: Allergy & Precautions, NPO status , Patient's Chart, lab work & pertinent test results  Airway Mallampati: II  TM Distance: >3 FB Neck ROM: Full  Mouth opening: Limited Mouth Opening  Dental no notable dental hx. (+) Teeth Intact   Pulmonary neg pulmonary ROS,  Denies OSA or any pulm issues    Pulmonary exam normal breath sounds clear to auscultation       Cardiovascular Exercise Tolerance: Good hypertension, Pt. on medications and Pt. on home beta blockers (-) CAD Normal cardiovascular examI Rhythm:Regular Rate:Normal  Denies CAD or CP States can walk a mile  Denies NTG use    Neuro/Psych negative neurological ROS  negative psych ROS   GI/Hepatic negative GI ROS, Neg liver ROS,   Endo/Other  Morbid obesity  Renal/GU Renal diseaseH/o Stones -prox here for cysto stent   negative genitourinary   Musculoskeletal negative musculoskeletal ROS (+)   Abdominal   Peds negative pediatric ROS (+)  Hematology negative hematology ROS (+)   Anesthesia Other Findings   Reproductive/Obstetrics negative OB ROS                            Anesthesia Physical Anesthesia Plan  ASA: III  Anesthesia Plan: General   Post-op Pain Management:    Induction: Intravenous  PONV Risk Score and Plan:   Airway Management Planned: Oral ETT  Additional Equipment:   Intra-op Plan:   Post-operative Plan: Extubation in OR  Informed Consent: I have reviewed the patients History and Physical, chart, labs and discussed the procedure including the risks, benefits and alternatives for the proposed anesthesia with the patient or authorized representative who has indicated his/her understanding and acceptance.     Dental advisory given  Plan Discussed with: CRNA  Anesthesia Plan Comments: (Full PPE planned   GETA planned  Case deemed appropriate  by Dr. Annabell Howells in light of current COVID concerns )        Anesthesia Quick Evaluation

## 2019-02-04 NOTE — Interval H&P Note (Signed)
History and Physical Interval Note: She has had recent pain, nausea and fever and has right ureteral obstruction from a 18mm proximal stone.  She also has a 39mm left renal pelvic stone.  I discussed the pros and cons of bilateral stenting vs just the right stent and a left stent will facilitate subsequent left ureteroscopy which is planned to be done with the right URS in 1-2 weeks.   02/04/2019 10:12 AM  Sarah Davila  has presented today for surgery, with the diagnosis of RIGHT PROXIMAL STONE.  The various methods of treatment have been discussed with the patient and family. After consideration of risks, benefits and other options for treatment, the patient has consented to  Procedure(s): CYSTOSCOPY WITH RETROGRADE PYELOGRAM/URETERAL STENT PLACEMENT (Right) as a surgical intervention.  The patient's history has been reviewed, patient examined, no change in status, stable for surgery.  I have reviewed the patient's chart and labs.  Questions were answered to the patient's satisfaction.     Bjorn Pippin

## 2019-02-04 NOTE — Anesthesia Procedure Notes (Signed)
Procedure Name: Intubation Date/Time: 02/04/2019 10:31 AM Performed by: Moshe Salisbury, CRNA Pre-anesthesia Checklist: Patient identified, Patient being monitored, Timeout performed, Emergency Drugs available and Suction available Patient Re-evaluated:Patient Re-evaluated prior to induction Oxygen Delivery Method: Circle system utilized Preoxygenation: Pre-oxygenation with 100% oxygen Induction Type: IV induction Ventilation: Mask ventilation without difficulty Laryngoscope Size: Glidescope (S3) Grade View: Grade I Tube type: Oral Tube size: 7.0 mm Number of attempts: 1 Airway Equipment and Method: Stylet Placement Confirmation: ETT inserted through vocal cords under direct vision,  positive ETCO2 and breath sounds checked- equal and bilateral Secured at: 21 cm Tube secured with: Tape Dental Injury: Teeth and Oropharynx as per pre-operative assessment

## 2019-02-04 NOTE — Anesthesia Postprocedure Evaluation (Signed)
Anesthesia Post Note  Patient: Sarah Davila  Procedure(s) Performed: CYSTOSCOPY WITH RETROGRADE PYELOGRAM/URETERAL STENT PLACEMENT (Bilateral Ureter)  Patient location during evaluation: PACU Anesthesia Type: General Level of consciousness: awake and alert and oriented Pain management: pain level controlled Vital Signs Assessment: post-procedure vital signs reviewed and stable Respiratory status: spontaneous breathing Cardiovascular status: blood pressure returned to baseline and stable Postop Assessment: no apparent nausea or vomiting Anesthetic complications: no     Last Vitals:  Vitals:   02/04/19 1112 02/04/19 1139  BP: (!) 153/67 (!) 166/89  Pulse: 79 77  Resp: 20   Temp: 36.7 C 36.7 C  SpO2: 100% 94%    Last Pain:  Vitals:   02/04/19 1139  TempSrc: Oral  PainSc: 1                  Yaris Ferrell

## 2019-02-04 NOTE — Discharge Instructions (Addendum)
Ureteral Stent Implantation, Care After Refer to this sheet in the next few weeks. These instructions provide you with information about caring for yourself after your procedure. Your health care provider may also give you more specific instructions. Your treatment has been planned according to current medical practices, but problems sometimes occur. Call your health care provider if you have any problems or questions after your procedure. What can I expect after the procedure? After the procedure, it is common to have:  Nausea.  Mild pain when you urinate. You may feel this pain in your lower back or lower abdomen. Pain should stop within a few minutes after you urinate. This may last for up to 1 week.  A small amount of blood in your urine for several days. Follow these instructions at home:  Medicines  Take over-the-counter and prescription medicines only as told by your health care provider.  If you were prescribed an antibiotic medicine, take it as told by your health care provider. Do not stop taking the antibiotic even if you start to feel better.  Do not drive for 24 hours if you received a sedative.  Do not drive or operate heavy machinery while taking prescription pain medicines. Activity  Return to your normal activities as told by your health care provider. Ask your health care provider what activities are safe for you.  Do not lift anything that is heavier than 10 lb (4.5 kg). Follow this limit for 1 week after your procedure, or for as long as told by your health care provider. General instructions  Watch for any blood in your urine. Call your health care provider if the amount of blood in your urine increases.  If you have a catheter: ? Follow instructions from your health care provider about taking care of your catheter and collection bag. ? Do not take baths, swim, or use a hot tub until your health care provider approves.  Drink enough fluid to keep your urine  clear or pale yellow.  Keep all follow-up visits as told by your health care provider. This is important. Contact a health care provider if:  You have pain that gets worse or does not get better with medicine, especially pain when you urinate.  You have difficulty urinating.  You feel nauseous or you vomit repeatedly during a period of more than 2 days after the procedure. Get help right away if:  Your urine is dark red or has blood clots in it.  You are leaking urine (have incontinence).  The end of the stent comes out of your urethra.  You cannot urinate.  You have sudden, sharp, or severe pain in your abdomen or lower back.  You have a fever.  I have sent another script for fluconazole for the yeast infection and also a script for nystatin powder to use on the rash.  You may use the oxybutynin or pyridium as needed for stent pain.  I have sent tamsulosin and percocet as well.    This information is not intended to replace advice given to you by your health care provider. Make sure you discuss any questions you have with your health care provider. Document Released: 05/26/2013 Document Revised: 02/29/2016 Document Reviewed: 04/07/2015 Elsevier Interactive Patient Education  2019 [employer]  PATIENT INSTRUCTIONS POST-ANESTHESIA  IMMEDIATELY FOLLOWING SURGERY:  Do not drive or operate machinery for the first twenty four hours after surgery.  Do not make any important decisions for twenty four hours after surgery or while taking narcotic  pain medications or sedatives.  If you develop intractable nausea and vomiting or a severe headache please notify your doctor immediately.  FOLLOW-UP:  Please make an appointment with your surgeon as instructed. You do not need to follow up with anesthesia unless specifically instructed to do so.  WOUND CARE INSTRUCTIONS (if applicable):  Keep a dry clean dressing on the anesthesia/puncture wound site if there is drainage.  Once the wound  has quit draining you may leave it open to air.  Generally you should leave the bandage intact for twenty four hours unless there is drainage.  If the epidural site drains for more than 36-48 hours please call the anesthesia department.  QUESTIONS?:  Please feel free to call your physician or the hospital operator if you have any questions, and they will be happy to assist you.

## 2019-02-05 ENCOUNTER — Encounter (HOSPITAL_COMMUNITY): Payer: Self-pay | Admitting: Urology

## 2019-02-06 LAB — URINE CULTURE: Culture: 60000 — AB

## 2019-02-10 ENCOUNTER — Other Ambulatory Visit: Payer: Self-pay

## 2019-02-10 ENCOUNTER — Ambulatory Visit (INDEPENDENT_AMBULATORY_CARE_PROVIDER_SITE_OTHER): Payer: PRIVATE HEALTH INSURANCE | Admitting: Urology

## 2019-02-10 ENCOUNTER — Telehealth: Payer: Self-pay | Admitting: Family Medicine

## 2019-02-10 ENCOUNTER — Other Ambulatory Visit (HOSPITAL_COMMUNITY)
Admission: AD | Admit: 2019-02-10 | Discharge: 2019-02-10 | Disposition: A | Discharge status: Home / Self Care | Payer: PRIVATE HEALTH INSURANCE | Source: Other Acute Inpatient Hospital | Attending: Urology | Admitting: Urology

## 2019-02-10 DIAGNOSIS — N390 Urinary tract infection, site not specified: Secondary | ICD-10-CM | POA: Diagnosis not present

## 2019-02-10 DIAGNOSIS — I10 Essential (primary) hypertension: Secondary | ICD-10-CM

## 2019-02-10 DIAGNOSIS — N200 Calculus of kidney: Secondary | ICD-10-CM | POA: Insufficient documentation

## 2019-02-10 DIAGNOSIS — N201 Calculus of ureter: Secondary | ICD-10-CM

## 2019-02-10 MED ORDER — LISINOPRIL 20 MG PO TABS: 20.0000 mg | ORAL_TABLET | Freq: Every day | ORAL | 1 refills | Status: DC

## 2019-02-10 NOTE — Telephone Encounter (Signed)
Pt needs Lisinopril called in to [facility]

## 2019-02-10 NOTE — Telephone Encounter (Signed)
Script refilled

## 2019-02-11 LAB — URINE CULTURE: Culture: NO GROWTH

## 2019-02-12 ENCOUNTER — Telehealth: Payer: Self-pay | Admitting: *Deleted

## 2019-02-12 ENCOUNTER — Encounter (HOSPITAL_COMMUNITY): Payer: Self-pay | Admitting: *Deleted

## 2019-02-12 ENCOUNTER — Encounter (HOSPITAL_COMMUNITY)
Admission: RE | Admit: 2019-02-12 | Discharge: 2019-02-12 | Disposition: A | Discharge status: Home / Self Care | Payer: PRIVATE HEALTH INSURANCE | Source: Ambulatory Visit | Attending: Urology | Admitting: Urology

## 2019-02-12 ENCOUNTER — Other Ambulatory Visit: Payer: Self-pay

## 2019-02-12 NOTE — Telephone Encounter (Signed)
Pt called needing lisinopril sent to [facility] in [place]. Said she had requested a refill on Wednesday and still doesn't have medication and she has just taken her last one.

## 2019-02-12 NOTE — Telephone Encounter (Signed)
Spoke with patient and let her know that I had call in refill 2 days ago. [facility], they stated insurance kept rejecting it because it was too early. Refill waiting at [facility] on [address]. Let pt know. The refill I sent in was sent to [facility]. That is the only pharmacy we have listed for this patient.

## 2019-02-15 MED ORDER — HYDROCODONE-ACETAMINOPHEN 7.5-325 MG PO TABS: 1.0000 | ORAL_TABLET | Freq: Once | ORAL | Status: DC | PRN

## 2019-02-15 MED ORDER — HYDROMORPHONE HCL 1 MG/ML IJ SOLN: 0.2500 mg | INTRAMUSCULAR | Status: DC | PRN

## 2019-02-15 MED ORDER — PROMETHAZINE HCL 25 MG/ML IJ SOLN: 6.2500 mg | INTRAMUSCULAR | Status: DC | PRN

## 2019-02-15 MED ORDER — MIDAZOLAM HCL 2 MG/2ML IJ SOLN: 0.5000 mg | Freq: Once | INTRAMUSCULAR | Status: DC | PRN

## 2019-02-16 ENCOUNTER — Ambulatory Visit (HOSPITAL_COMMUNITY): Payer: PRIVATE HEALTH INSURANCE

## 2019-02-16 ENCOUNTER — Ambulatory Visit (HOSPITAL_COMMUNITY): Payer: PRIVATE HEALTH INSURANCE | Admitting: Anesthesiology

## 2019-02-16 ENCOUNTER — Ambulatory Visit (HOSPITAL_COMMUNITY)
Admission: RE | Admit: 2019-02-16 | Discharge: 2019-02-16 | Disposition: A | Discharge status: Home / Self Care | Payer: PRIVATE HEALTH INSURANCE | Attending: Urology | Admitting: Urology

## 2019-02-16 ENCOUNTER — Other Ambulatory Visit: Payer: Self-pay

## 2019-02-16 ENCOUNTER — Encounter (HOSPITAL_COMMUNITY)
Admission: RE | Disposition: A | Discharge status: Home / Self Care | Payer: Self-pay | Source: Home / Self Care | Attending: Urology

## 2019-02-16 ENCOUNTER — Encounter (HOSPITAL_COMMUNITY): Payer: Self-pay | Admitting: *Deleted

## 2019-02-16 DIAGNOSIS — Z6841 Body Mass Index (BMI) 40.0 and over, adult: Secondary | ICD-10-CM | POA: Diagnosis not present

## 2019-02-16 DIAGNOSIS — I10 Essential (primary) hypertension: Secondary | ICD-10-CM | POA: Diagnosis not present

## 2019-02-16 DIAGNOSIS — N200 Calculus of kidney: Secondary | ICD-10-CM

## 2019-02-16 DIAGNOSIS — N202 Calculus of kidney with calculus of ureter: Secondary | ICD-10-CM | POA: Diagnosis not present

## 2019-02-16 HISTORY — PX: CYSTOSCOPY/URETEROSCOPY/HOLMIUM LASER/STENT PLACEMENT: SHX6546

## 2019-02-16 LAB — PREGNANCY, URINE: Preg Test, Ur: NEGATIVE

## 2019-02-16 SURGERY — CYSTOSCOPY/URETEROSCOPY/HOLMIUM LASER/STENT PLACEMENT
Anesthesia: General | Site: Ureter | Laterality: Bilateral

## 2019-02-16 MED ORDER — STERILE WATER FOR IRRIGATION IR SOLN
Status: DC | PRN
  Administered 2019-02-16: 1000 mL

## 2019-02-16 MED ORDER — ROCURONIUM 10MG/ML (10ML) SYRINGE FOR MEDFUSION PUMP - OPTIME
INTRAVENOUS | Status: DC | PRN
  Administered 2019-02-16: 8 mg via INTRAVENOUS

## 2019-02-16 MED ORDER — MIDAZOLAM HCL 2 MG/2ML IJ SOLN
INTRAMUSCULAR | Status: AC
  Filled 2019-02-16: qty 2

## 2019-02-16 MED ORDER — ONDANSETRON HCL 4 MG/2ML IJ SOLN
INTRAMUSCULAR | Status: DC | PRN
  Administered 2019-02-16: 4 mg via INTRAVENOUS

## 2019-02-16 MED ORDER — FENTANYL CITRATE (PF) 100 MCG/2ML IJ SOLN
INTRAMUSCULAR | Status: AC
  Filled 2019-02-16: qty 2

## 2019-02-16 MED ORDER — ROCURONIUM BROMIDE 10 MG/ML (PF) SYRINGE
PREFILLED_SYRINGE | INTRAVENOUS | Status: AC
  Filled 2019-02-16: qty 10

## 2019-02-16 MED ORDER — ONDANSETRON HCL 4 MG/2ML IJ SOLN
INTRAMUSCULAR | Status: AC
  Filled 2019-02-16: qty 2

## 2019-02-16 MED ORDER — HYDROMORPHONE HCL 1 MG/ML IJ SOLN
0.2500 mg | INTRAMUSCULAR | Status: DC | PRN
  Administered 2019-02-16: 0.5 mg via INTRAVENOUS

## 2019-02-16 MED ORDER — KETOROLAC TROMETHAMINE 30 MG/ML IJ SOLN
30.0000 mg | Freq: Once | INTRAMUSCULAR | Status: AC | PRN
  Administered 2019-02-16: 30 mg via INTRAVENOUS
  Filled 2019-02-16: qty 1

## 2019-02-16 MED ORDER — PROPOFOL 10 MG/ML IV BOLUS
INTRAVENOUS | Status: DC | PRN
  Administered 2019-02-16: 200 mg via INTRAVENOUS

## 2019-02-16 MED ORDER — OXYCODONE-ACETAMINOPHEN 5-325 MG PO TABS: 1.0000 | ORAL_TABLET | Freq: Four times a day (QID) | ORAL | 0 refills | Status: DC | PRN

## 2019-02-16 MED ORDER — ONDANSETRON HCL 4 MG/2ML IJ SOLN: 4.0000 mg | Freq: Once | INTRAMUSCULAR | Status: DC | PRN

## 2019-02-16 MED ORDER — PROPOFOL 10 MG/ML IV BOLUS
INTRAVENOUS | Status: AC
  Filled 2019-02-16: qty 40

## 2019-02-16 MED ORDER — EPHEDRINE SULFATE 50 MG/ML IJ SOLN
INTRAMUSCULAR | Status: DC | PRN
  Administered 2019-02-16: 5 mg via INTRAVENOUS

## 2019-02-16 MED ORDER — MIDAZOLAM HCL 5 MG/5ML IJ SOLN
INTRAMUSCULAR | Status: DC | PRN
  Administered 2019-02-16: 2 mg via INTRAVENOUS

## 2019-02-16 MED ORDER — HYDROMORPHONE HCL 1 MG/ML IJ SOLN
INTRAMUSCULAR | Status: AC
  Filled 2019-02-16: qty 0.5

## 2019-02-16 MED ORDER — SUCCINYLCHOLINE 20MG/ML (10ML) SYRINGE FOR MEDFUSION PUMP - OPTIME
INTRAMUSCULAR | Status: DC | PRN
  Administered 2019-02-16: 120 mg via INTRAVENOUS

## 2019-02-16 MED ORDER — ARTIFICIAL TEARS OPHTHALMIC OINT
TOPICAL_OINTMENT | OPHTHALMIC | Status: AC
  Filled 2019-02-16: qty 3.5

## 2019-02-16 MED ORDER — LACTATED RINGERS IV SOLN
INTRAVENOUS | Status: DC
  Administered 2019-02-16: 10:00:00 via INTRAVENOUS
  Administered 2019-02-16: 1000 mL via INTRAVENOUS

## 2019-02-16 MED ORDER — HYDROCODONE-ACETAMINOPHEN 7.5-325 MG PO TABS: 1.0000 | ORAL_TABLET | Freq: Once | ORAL | Status: DC | PRN

## 2019-02-16 MED ORDER — SUCCINYLCHOLINE CHLORIDE 200 MG/10ML IV SOSY
PREFILLED_SYRINGE | INTRAVENOUS | Status: AC
  Filled 2019-02-16: qty 10

## 2019-02-16 MED ORDER — SODIUM CHLORIDE 0.9% FLUSH: 3.0000 mL | Freq: Two times a day (BID) | INTRAVENOUS | Status: DC

## 2019-02-16 MED ORDER — DIATRIZOATE MEGLUMINE 30 % UR SOLN
URETHRAL | Status: AC
  Filled 2019-02-16: qty 100

## 2019-02-16 MED ORDER — EPHEDRINE 5 MG/ML INJ
INTRAVENOUS | Status: AC
  Filled 2019-02-16: qty 10

## 2019-02-16 MED ORDER — CEFAZOLIN SODIUM-DEXTROSE 2-4 GM/100ML-% IV SOLN
2.0000 g | INTRAVENOUS | Status: AC
  Administered 2019-02-16: 2 g via INTRAVENOUS
  Filled 2019-02-16: qty 100

## 2019-02-16 MED ORDER — SODIUM CHLORIDE 0.9 % IR SOLN
Status: DC | PRN
  Administered 2019-02-16: 3000 mL

## 2019-02-16 MED ORDER — MEPERIDINE HCL 50 MG/ML IJ SOLN: 6.2500 mg | INTRAMUSCULAR | Status: DC | PRN

## 2019-02-16 MED ORDER — FENTANYL CITRATE (PF) 100 MCG/2ML IJ SOLN
INTRAMUSCULAR | Status: DC | PRN
  Administered 2019-02-16: 100 ug via INTRAVENOUS
  Administered 2019-02-16: 25 ug via INTRAVENOUS

## 2019-02-16 SURGICAL SUPPLY — 20 items
BAG DRAIN URO TABLE W/ADPT NS (BAG) ×3 IMPLANT
CATH URET 5FR 28IN OPEN ENDED (CATHETERS) ×3 IMPLANT
CLOTH BEACON ORANGE TIMEOUT ST (SAFETY) ×3 IMPLANT
EXTRACTOR STONE NITINOL NGAGE (UROLOGICAL SUPPLIES) ×3 IMPLANT
FIBER LASER FLEXIVA 200 (UROLOGICAL SUPPLIES) ×3 IMPLANT
GLOVE BIOGEL PI IND STRL 7.0 (GLOVE) ×2 IMPLANT
GLOVE BIOGEL PI INDICATOR 7.0 (GLOVE) ×4
GLOVE SURG SS PI 8.0 STRL IVOR (GLOVE) ×3 IMPLANT
GOWN STRL REUS W/TWL LRG LVL3 (GOWN DISPOSABLE) ×6 IMPLANT
GUIDEWIRE STR DUAL SENSOR (WIRE) ×3 IMPLANT
IV NS IRRIG 3000ML ARTHROMATIC (IV SOLUTION) ×3 IMPLANT
KIT TURNOVER CYSTO (KITS) ×3 IMPLANT
MANIFOLD NEPTUNE II (INSTRUMENTS) ×3 IMPLANT
PACK CYSTO (CUSTOM PROCEDURE TRAY) ×3 IMPLANT
PAD ARMBOARD 7.5X6 YLW CONV (MISCELLANEOUS) ×3 IMPLANT
SHEATH URETERAL 12FRX35CM (MISCELLANEOUS) ×3 IMPLANT
STENT URET 6FRX24 CONTOUR (STENTS) ×6 IMPLANT
STENT URET 6FRX26 CONTOUR (STENTS) ×3 IMPLANT
TOWEL OR 17X26 4PK STRL BLUE (TOWEL DISPOSABLE) ×3 IMPLANT
WATER STERILE IRR 1000ML POUR (IV SOLUTION) ×3 IMPLANT

## 2019-02-16 NOTE — Interval H&P Note (Signed)
History and Physical Interval Note:  02/16/2019 8:05 AM  Sarah Davila  has presented today for surgery, with the diagnosis of BILATERAL RENAL AND RIGHT URETERAL STONES.  The various methods of treatment have been discussed with the patient and family. After consideration of risks, benefits and other options for treatment, the patient has consented to  Procedure(s): CYSTOSCOPY BILATERAL URETEROSCOPY WITH HOLMIUM LASER AND STENTS PLACEMENT (Bilateral) as a surgical intervention.  The patient's history has been reviewed, patient examined, no change in status, stable for surgery.  I have reviewed the patient's chart and labs.  Questions were answered to the patient's satisfaction.     Bjorn Pippin

## 2019-02-16 NOTE — Op Note (Signed)
Procedure: 1.  Removal of right double-J stent. 2.  Right ureteroscopy with holmium laser application, stone extraction and placement of double-J stent. 3.  Removal of left double-J stent. 4.  Left ureteroscopy with holmium laser application, stone extraction and placement of double-J stent.  Preop diagnosis: 1.  Right proximal ureteral and renal stones. 2.  Left UPJ and renal stones.  Postop diagnosis: Same.  Surgeon: Dr. Bjorn PippinJohn Telisha Zawadzki.  Anesthesia: General.  Specimen: Stone fragments.  Drains: Bilateral 6 French by 24 cm contour double-J stents with tether's.  EBL: Minimal.  Complications: None.  Indications: Sarah Davila is a 58 year old female who initially presented with a 5 mm right proximal stone and infection.  She was also noted to have small right renal stones and a 11 to 12 mm stone in the left renal pelvis with an additional upper pole stone.  She had previously undergone placement of bilateral ureteral stents and treatment of infection.  She returns today for definitive stone management.  Procedure: She was given 2 g of Ancef.  She was taken operating room where general anesthetic was induced.  She was placed in lithotomy position and fitted with PAS hose.  Her perineum and genitalia were prepped with Betadine solution and she was draped in usual sterile fashion.  Cystoscopy was performed using a 23 [place]French scope and 30 degree lens.  Examination revealed a normal urethra.  The urine was bloody from the stents that were present at both ureteral orifice ease with mild periureteral edema.  The bladder wall was otherwise unremarkable.  The right ureteral stent was grasped and pulled to the urethral meatus.  A guidewire was passed to the kidney and the stent was removed.  The 35 cm digital access sheath was placed over the wire and advanced to the proximal ureter.  The inner core and wire were removed.  A dual-lumen digital flexible ureteroscope was passed through the sheath.  The proximal  ureteral stone was noted to be impacted in the proximal ureter.  It was engaged with a 200 m laser fiber and broken into manageable pieces which were then removed with the engage basket.  I then advanced the scope into the renal collecting system and noted a submucosal stone in the upper calyceal system that was too adherent to remove.  There was a second stone noted in the mid calyceal system in a calyx with infundibular stenosis.  The stenosis was incised with the laser and the stone was moved to the renal pelvis where it was also fragmented with the laser and removed.  The sheath was then backed out while visually inspecting the ureter.  There was some mild proximal ureteral stricturing but no significant ureteral mucosal injuries were identified.  The cystoscope was reinserted and the left ureteral stent was grasped and pulled to the urethral meatus.  The sensor wire was then passed to the left kidney and the stent was removed.  The access sheath was then reassembled and inserted to the level of the UPJ and the inner core and wire were removed.  The dual-lumen digital scope was then passed and the stone in the upper pole was initially engaged.  The stone was fragmented with the 200 m laser and the fragments were removed with the engage basket.  I then turned my attention to the 12 mm stone in the renal pelvis which was actually obstructing a lower calyx.  The power of the laser was then set on 0.3 J and 50 Hz and the stone was  broken into manageable fragments.  All of the significant fragments were removed with the engage basket leaving only grit and sand.  Once fluoroscopic and visual inspection confirmed no significant residual fragments the scope was removed.  The sensor guidewire was reinserted to the kidney and the access sheath was removed.  A 6 French by 24 centimeter contour double-J stent with tether was passed to the kidney without difficulty under fluoroscopic guidance.  The wire was removed  leaving good coil in the kidney and a good coil in the bladder.  The cystoscope then reinserted and a wire was passed back up to the right kidney and a second 6 [place] by 24 cm contour double-J stent with tether was passed on the right.  The wire was removed leaving good coil in the kidney and a good coil in the bladder.  The bladder was then drained and the cystoscope was removed leaving the stent string exiting urethra.  Both strings were tied close to the meatus trimmed to an appropriate length and tucked vaginally.  She was taken down from lithotomy position, her anesthetic was reversed and she was moved to recovery room in stable condition.  Some of the stone fragments were sent to pathology and somewhere safe for the patient's viewing.  There were no complications.

## 2019-02-16 NOTE — Anesthesia Postprocedure Evaluation (Signed)
Anesthesia Post Note  Patient: Sarah Davila  Procedure(s) Performed: CYSTOSCOPY BILATERAL URETEROSCOPY WITH HOLMIUM LASER AND STENTS PLACEMENT (Bilateral Ureter)  Patient location during evaluation: PACU Anesthesia Type: General Level of consciousness: awake and alert and oriented Pain management: pain level controlled Vital Signs Assessment: post-procedure vital signs reviewed and stable Respiratory status: spontaneous breathing Cardiovascular status: blood pressure returned to baseline and stable Postop Assessment: no apparent nausea or vomiting Anesthetic complications: no Comments: Late entry     Last Vitals:  Vitals:   02/16/19 1115 02/16/19 1131  BP: (!) 91/52 127/73  Pulse: 87 80  Resp: (!) 22 18  Temp:  36.6 C  SpO2: 95% 92%    Last Pain:  Vitals:   02/16/19 1131  TempSrc: Oral  PainSc: 0-No pain                 Secundino Ellithorpe

## 2019-02-16 NOTE — Anesthesia Procedure Notes (Signed)
Procedure Name: Intubation Performed by: Moshe Salisbury, CRNA Pre-anesthesia Checklist: Patient identified, Patient being monitored, Timeout performed, Emergency Drugs available and Suction available Patient Re-evaluated:Patient Re-evaluated prior to induction Oxygen Delivery Method: Circle system utilized Preoxygenation: Pre-oxygenation with 100% oxygen Induction Type: IV induction Ventilation: Mask ventilation without difficulty Laryngoscope Size: Glidescope (S3) Grade View: Grade I Tube type: Oral Tube size: 7.0 mm Number of attempts: 1 Airway Equipment and Method: Rigid stylet Placement Confirmation: ETT inserted through vocal cords under direct vision,  positive ETCO2 and breath sounds checked- equal and bilateral Secured at: 21 cm Tube secured with: Tape Dental Injury: Teeth and Oropharynx as per pre-operative assessment

## 2019-02-16 NOTE — Anesthesia Preprocedure Evaluation (Signed)
Anesthesia Evaluation  Patient identified by MRN, date of birth, ID band Patient awake    Reviewed: Allergy & Precautions, H&P , NPO status , Patient's Chart, lab work & pertinent test results  Airway Mallampati: II  TM Distance: >3 FB Neck ROM: Full  Mouth opening: Limited Mouth Opening  Dental no notable dental hx. (+) Teeth Intact   Pulmonary neg pulmonary ROS,  Denies OSA or any pulm issues    Pulmonary exam normal breath sounds clear to auscultation       Cardiovascular Exercise Tolerance: Good hypertension, Pt. on medications and Pt. on home beta blockers (-) CAD Normal cardiovascular examI Rhythm:Regular Rate:Normal  Denies CAD or CP States can walk a mile  Denies NTG use    Neuro/Psych negative neurological ROS  negative psych ROS   GI/Hepatic negative GI ROS, Neg liver ROS,   Endo/Other  Morbid obesity  Renal/GU Renal diseaseH/o Stones -prox here for cysto stent   negative genitourinary   Musculoskeletal negative musculoskeletal ROS (+)   Abdominal   Peds negative pediatric ROS (+)  Hematology negative hematology ROS (+)   Anesthesia Other Findings   Reproductive/Obstetrics negative OB ROS                             Anesthesia Physical  Anesthesia Plan  ASA: III  Anesthesia Plan: General   Post-op Pain Management:    Induction: Intravenous  PONV Risk Score and Plan:   Airway Management Planned: Oral ETT  Additional Equipment:   Intra-op Plan:   Post-operative Plan: Extubation in OR  Informed Consent: I have reviewed the patients History and Physical, chart, labs and discussed the procedure including the risks, benefits and alternatives for the proposed anesthesia with the patient or authorized representative who has indicated his/her understanding and acceptance.     Dental advisory given  Plan Discussed with: CRNA  Anesthesia Plan Comments: (Full PPE  planned  GETA planned  Case deemed appropriate  by Dr. Annabell Howells in light of current COVID concerns )        Anesthesia Quick Evaluation

## 2019-02-16 NOTE — Discharge Instructions (Signed)
Ureteral Stent Implantation, Care After Refer to this sheet in the next few weeks. These instructions provide you with information about caring for yourself after your procedure. Your health care provider may also give you more specific instructions. Your treatment has been planned according to current medical practices, but problems sometimes occur. Call your health care provider if you have any problems or questions after your procedure. What can I expect after the procedure? After the procedure, it is common to have:  Nausea.  Mild pain when you urinate. You may feel this pain in your lower back or lower abdomen. Pain should stop within a few minutes after you urinate. This may last for up to 1 week.  A small amount of blood in your urine for several days. Follow these instructions at home:  Medicines  Take over-the-counter and prescription medicines only as told by your health care provider.  If you were prescribed an antibiotic medicine, take it as told by your health care provider. Do not stop taking the antibiotic even if you start to feel better.  Do not drive for 24 hours if you received a sedative.  Do not drive or operate heavy machinery while taking prescription pain medicines. Activity  Return to your normal activities as told by your health care provider. Ask your health care provider what activities are safe for you.  Do not lift anything that is heavier than 10 lb (4.5 kg). Follow this limit for 1 week after your procedure, or for as long as told by your health care provider. General instructions  Watch for any blood in your urine. Call your health care provider if the amount of blood in your urine increases.  If you have a catheter: ? Follow instructions from your health care provider about taking care of your catheter and collection bag. ? Do not take baths, swim, or use a hot tub until your health care provider approves.  Drink enough fluid to keep your urine  clear or pale yellow.  Keep all follow-up visits as told by your health care provider. This is important. Contact a health care provider if:  You have pain that gets worse or does not get better with medicine, especially pain when you urinate.  You have difficulty urinating.  You feel nauseous or you vomit repeatedly during a period of more than 2 days after the procedure. Get help right away if:  Your urine is dark red or has blood clots in it.  You are leaking urine (have incontinence).  The end of the stent comes out of your urethra.  You cannot urinate.  You have sudden, sharp, or severe pain in your abdomen or lower back.  You have a fever.  You may remove both of the stents on Friday morning by pulling the attached strings.  They should just slide out.  If you don't feel you can do that let us know so we can arrange a visit for that.   This information is not intended to replace advice given to you by your health care provider. Make sure you discuss any questions you have with your health care provider. Document Released: 05/26/2013 Document Revised: 02/29/2016 Document Reviewed: 04/07/2015 Elsevier Interactive Patient Education  2019 [employer].

## 2019-02-16 NOTE — Transfer of Care (Signed)
Immediate Anesthesia Transfer of Care Note  Patient: Sarah Davila  Procedure(s) Performed: CYSTOSCOPY BILATERAL URETEROSCOPY WITH HOLMIUM LASER AND STENTS PLACEMENT (Bilateral Ureter)  Patient Location: PACU  Anesthesia Type:General  Level of Consciousness: awake  Airway & Oxygen Therapy: Patient Spontanous Breathing  Post-op Assessment: Report given to RN  Post vital signs: Reviewed and stable  Last Vitals:  Vitals Value Taken Time  BP 133/70 02/16/2019 10:09 AM  Temp    Pulse 76 02/16/2019 10:14 AM  Resp 21 02/16/2019 10:14 AM  SpO2 100 % 02/16/2019 10:14 AM  Vitals shown include unvalidated device data.  Last Pain:  Vitals:   02/16/19 0718  TempSrc: Oral  PainSc: 5       Patients Stated Pain Goal: 5 (02/16/19 4982)  Complications: No apparent anesthesia complications

## 2019-02-17 ENCOUNTER — Encounter (HOSPITAL_COMMUNITY): Payer: Self-pay | Admitting: Urology

## 2019-02-17 NOTE — Addendum Note (Signed)
Addendum  created 02/17/19 1116 by Moshe Salisbury, CRNA   Charge Capture section accepted

## 2019-02-24 LAB — CALCULI, WITH PHOTOGRAPH (CLINICAL LAB)
Calcium Oxalate Dihydrate: 40 %
Calcium Oxalate Monohydrate: 55 %
Hydroxyapatite: 5 %
Weight Calculi: 87 mg

## 2019-02-26 ENCOUNTER — Ambulatory Visit (INDEPENDENT_AMBULATORY_CARE_PROVIDER_SITE_OTHER): Payer: PRIVATE HEALTH INSURANCE | Admitting: Urology

## 2019-02-26 ENCOUNTER — Other Ambulatory Visit (HOSPITAL_COMMUNITY)
Admission: RE | Admit: 2019-02-26 | Discharge: 2019-02-26 | Disposition: A | Discharge status: Home / Self Care | Payer: PRIVATE HEALTH INSURANCE | Source: Ambulatory Visit | Attending: Urology | Admitting: Urology

## 2019-02-26 DIAGNOSIS — Z87440 Personal history of urinary (tract) infections: Secondary | ICD-10-CM

## 2019-02-26 DIAGNOSIS — N201 Calculus of ureter: Secondary | ICD-10-CM

## 2019-02-26 DIAGNOSIS — N200 Calculus of kidney: Secondary | ICD-10-CM

## 2019-02-26 LAB — URINALYSIS, COMPLETE (UACMP) WITH MICROSCOPIC
Bilirubin Urine: NEGATIVE
Glucose, UA: NEGATIVE mg/dL
Hgb urine dipstick: NEGATIVE
Ketones, ur: NEGATIVE mg/dL
Nitrite: NEGATIVE
Protein, ur: NEGATIVE mg/dL
Specific Gravity, Urine: 1.01 (ref 1.005–1.030)
pH: 6 (ref 5.0–8.0)

## 2019-02-28 LAB — URINE CULTURE: Culture: NO GROWTH

## 2019-03-09 ENCOUNTER — Ambulatory Visit (HOSPITAL_COMMUNITY): Admission: RE | Admit: 2019-03-09 | Payer: PRIVATE HEALTH INSURANCE | Source: Ambulatory Visit

## 2019-03-09 ENCOUNTER — Ambulatory Visit (HOSPITAL_COMMUNITY)
Admission: RE | Admit: 2019-03-09 | Discharge: 2019-03-09 | Disposition: A | Discharge status: Home / Self Care | Payer: PRIVATE HEALTH INSURANCE | Source: Ambulatory Visit | Attending: Family Medicine | Admitting: Family Medicine

## 2019-03-09 ENCOUNTER — Other Ambulatory Visit (HOSPITAL_COMMUNITY): Payer: Self-pay | Admitting: Urology

## 2019-03-09 ENCOUNTER — Other Ambulatory Visit: Payer: Self-pay

## 2019-03-09 ENCOUNTER — Ambulatory Visit (HOSPITAL_COMMUNITY): Payer: PRIVATE HEALTH INSURANCE

## 2019-03-09 ENCOUNTER — Other Ambulatory Visit: Payer: Self-pay | Admitting: Urology

## 2019-03-09 DIAGNOSIS — N200 Calculus of kidney: Secondary | ICD-10-CM

## 2019-03-09 DIAGNOSIS — N201 Calculus of ureter: Secondary | ICD-10-CM

## 2019-03-09 DIAGNOSIS — Z1231 Encounter for screening mammogram for malignant neoplasm of breast: Secondary | ICD-10-CM | POA: Insufficient documentation

## 2019-03-10 NOTE — Progress Notes (Signed)
Negative for malignancy. Screening follow up in 1 year time.

## 2019-03-16 ENCOUNTER — Other Ambulatory Visit: Payer: Self-pay

## 2019-03-16 ENCOUNTER — Ambulatory Visit (HOSPITAL_COMMUNITY)
Admission: RE | Admit: 2019-03-16 | Discharge: 2019-03-16 | Disposition: A | Discharge status: Home / Self Care | Payer: PRIVATE HEALTH INSURANCE | Source: Ambulatory Visit | Attending: Urology | Admitting: Urology

## 2019-03-16 DIAGNOSIS — N201 Calculus of ureter: Secondary | ICD-10-CM | POA: Diagnosis present

## 2019-03-16 DIAGNOSIS — N200 Calculus of kidney: Secondary | ICD-10-CM

## 2019-03-23 ENCOUNTER — Ambulatory Visit: Payer: PRIVATE HEALTH INSURANCE | Admitting: Family Medicine

## 2019-04-15 ENCOUNTER — Ambulatory Visit: Payer: PRIVATE HEALTH INSURANCE | Admitting: Family Medicine

## 2019-04-16 ENCOUNTER — Ambulatory Visit: Payer: PRIVATE HEALTH INSURANCE | Admitting: Family Medicine

## 2019-04-16 ENCOUNTER — Ambulatory Visit (INDEPENDENT_AMBULATORY_CARE_PROVIDER_SITE_OTHER): Payer: PRIVATE HEALTH INSURANCE | Admitting: Urology

## 2019-04-16 ENCOUNTER — Other Ambulatory Visit: Payer: Self-pay

## 2019-04-16 DIAGNOSIS — R82994 Hypercalciuria: Secondary | ICD-10-CM

## 2019-04-16 DIAGNOSIS — E790 Hyperuricemia without signs of inflammatory arthritis and tophaceous disease: Secondary | ICD-10-CM | POA: Diagnosis not present

## 2019-04-16 DIAGNOSIS — N200 Calculus of kidney: Secondary | ICD-10-CM | POA: Diagnosis not present

## 2019-04-23 ENCOUNTER — Other Ambulatory Visit: Payer: Self-pay

## 2019-04-23 ENCOUNTER — Encounter: Payer: Self-pay | Admitting: Family Medicine

## 2019-04-23 ENCOUNTER — Other Ambulatory Visit: Payer: PRIVATE HEALTH INSURANCE

## 2019-04-23 ENCOUNTER — Ambulatory Visit (INDEPENDENT_AMBULATORY_CARE_PROVIDER_SITE_OTHER): Payer: PRIVATE HEALTH INSURANCE | Admitting: Family Medicine

## 2019-04-23 VITALS — BP 140/80 | HR 87 | Temp 98.9°F | Ht 62.0 in | Wt 215.4 lb

## 2019-04-23 DIAGNOSIS — G4700 Insomnia, unspecified: Secondary | ICD-10-CM

## 2019-04-23 DIAGNOSIS — I10 Essential (primary) hypertension: Secondary | ICD-10-CM

## 2019-04-23 DIAGNOSIS — Z20822 Contact with and (suspected) exposure to covid-19: Secondary | ICD-10-CM

## 2019-04-23 DIAGNOSIS — R05 Cough: Secondary | ICD-10-CM

## 2019-04-23 DIAGNOSIS — R059 Cough, unspecified: Secondary | ICD-10-CM

## 2019-04-23 MED ORDER — HYDROCODONE-HOMATROPINE 5-1.5 MG/5ML PO SYRP: 5.0000 mL | ORAL_SOLUTION | Freq: Every day | ORAL | 0 refills | Status: AC

## 2019-04-23 MED ORDER — ESZOPICLONE 1 MG PO TABS: 1.0000 mg | ORAL_TABLET | Freq: Every evening | ORAL | 0 refills | Status: DC | PRN

## 2019-04-23 MED ORDER — LISINOPRIL 20 MG PO TABS: 20.0000 mg | ORAL_TABLET | Freq: Every day | ORAL | 1 refills | Status: DC

## 2019-04-23 NOTE — Progress Notes (Signed)
Subjective:     Patient ID: Sarah AntisJessica M Agent, female   DOB: [birth year], 58 y.o.   MRN: [id number]  Sarah Davila presents for Cough (times one week)  Reports on going cough. Does not feel it is allergy related. Cough started back on Monday. Denies exposure to any + with COVID. Denies chest pain with cough. Denies productive nature. Denies cough occurring during the day. Reports it is mostly at night. Is unable to take acid medication due to kidney stones. Reported she had left over hycodan she took to sleep.   Additionally, reports on going trouble sleeping and nothing OTC has helped her. Would like to try something if she could to help with sleeping better.  Today patient denies some of the signs and symptoms of COVID 19 infection including fever, chills,  shortness of breath, and headache.  Past Medical, Surgical, Social History, Allergies, and Medications have been Reviewed  Past Medical History:  Diagnosis Date  . Coronary artery disease   . Hypertension   . Renal disorder   . Sepsis (HCC) 2015   urinary   Past Surgical History:  Procedure Laterality Date  . CYSTOSCOPY W/ URETERAL STENT PLACEMENT Bilateral 02/04/2019   Procedure: CYSTOSCOPY WITH RETROGRADE PYELOGRAM/URETERAL STENT PLACEMENT;  Surgeon: Bjorn PippinWrenn, John, MD;  Location: [facility];  Service: Urology;  Laterality: Bilateral;  . CYSTOSCOPY/URETEROSCOPY/HOLMIUM LASER/STENT PLACEMENT Bilateral 02/16/2019   Procedure: CYSTOSCOPY BILATERAL URETEROSCOPY WITH HOLMIUM LASER AND STENTS PLACEMENT;  Surgeon: Bjorn PippinWrenn, John, MD;  Location: [facility];  Service: Urology;  Laterality: Bilateral;   Social History   Socioeconomic History  . Marital status: Divorced    Spouse name: Not on file  . Number of children: Not on file  . Years of education: Not on file  . Highest education level: Not on file  Occupational History  . Not on file  Social Needs  . Financial resource strain: Not on file  . Food insecurity    Worry: Not on  file    Inability: Not on file  . Transportation needs    Medical: Not on file    Non-medical: Not on file  Tobacco Use  . Smoking status: Never Smoker  . Smokeless tobacco: Never Used  Substance and Sexual Activity  . Alcohol use: Never    Frequency: Never  . Drug use: Never  . Sexual activity: Yes    Birth control/protection: I.U.D.  Lifestyle  . Physical activity    Days per week: Not on file    Minutes per session: Not on file  . Stress: Not on file  Relationships  . Social Musicianconnections    Talks on phone: Not on file    Gets together: Not on file    Attends religious service: Not on file    Active member of club or organization: Not on file    Attends meetings of clubs or organizations: Not on file    Relationship status: Not on file  . Intimate partner violence    Fear of current or ex partner: Not on file    Emotionally abused: Not on file    Physically abused: Not on file    Forced sexual activity: Not on file  Other Topics Concern  . Not on file  Social History Narrative  . Not on file    Outpatient Encounter Medications as of 04/23/2019  Medication Sig  . aspirin EC 81 MG tablet Take 81 mg by mouth daily.  . Black Cohosh 200 MG CAPS Take 2  capsules by mouth daily.  . cephALEXin (KEFLEX) 250 MG capsule Take 500 mg by mouth daily.   Marland Kitchen docusate sodium (STOOL SOFTENER) 100 MG capsule Take 100 mg by mouth 2 (two) times daily.  . eszopiclone (LUNESTA) 1 MG TABS tablet Take 1 tablet (1 mg total) by mouth at bedtime as needed for sleep. Take immediately before bedtime  . fexofenadine (ALLEGRA) 180 MG tablet Take 180 mg by mouth daily.  . fluconazole (DIFLUCAN) 150 MG tablet Take 1 tablet (150 mg total) by mouth daily.  Marland Kitchen HYDROcodone-homatropine (HYCODAN) 5-1.5 MG/5ML syrup Take 5 mLs by mouth at bedtime for 7 days.  Marland Kitchen lisinopril (ZESTRIL) 20 MG tablet Take 1 tablet (20 mg total) by mouth daily.  Marland Kitchen loratadine (CLARITIN) 10 MG tablet Take 10 mg by mouth daily.  .  magnesium (MAGTAB) 84 MG (7MEQ) TBCR SR tablet Take 84 mg by mouth daily.  . Naproxen Sodium (ALEVE PO) Take 1 tablet by mouth as needed.  . nystatin (MYCOSTATIN/NYSTOP) powder Apply topically 3 (three) times daily. Apply to rash in groin till clear  . oxybutynin (DITROPAN) 5 MG tablet Take 1 tablet (5 mg total) by mouth every 8 (eight) hours as needed for bladder spasms.  Marland Kitchen oxyCODONE-acetaminophen (PERCOCET) 5-325 MG tablet Take 1 tablet by mouth every 6 (six) hours as needed for severe pain.  . phenazopyridine (PYRIDIUM) 200 MG tablet Take 1 tablet (200 mg total) by mouth 3 (three) times daily as needed for pain.  . tamsulosin (FLOMAX) 0.4 MG CAPS capsule Take 1 capsule (0.4 mg total) by mouth daily.  . vitamin B-12 (CYANOCOBALAMIN) 250 MCG tablet Take 250 mcg by mouth daily.  . [DISCONTINUED] lisinopril (ZESTRIL) 20 MG tablet Take 1 tablet (20 mg total) by mouth daily.   No facility-administered encounter medications on file as of 04/23/2019.    Allergies  Allergen Reactions  . Ciprofloxacin Other (See Comments)    Sever hot flashes    Review of Systems  Constitutional: Negative.  Negative for activity change, appetite change, chills and fever.  HENT: Negative.   Eyes: Negative.   Respiratory: Positive for cough.   Cardiovascular: Negative.   Gastrointestinal: Negative.   Endocrine: Negative.   Genitourinary: Negative.   Musculoskeletal: Negative.   Skin: Negative.   Allergic/Immunologic: Negative.   Neurological: Negative.   Hematological: Negative.   Psychiatric/Behavioral: Negative.   All other systems reviewed and are negative.      Objective:     BP 140/80   Pulse 87   Temp 98.9 F (37.2 C)   Ht 5\' 2"  (1.575 m)   Wt 215 lb 6.4 oz (97.7 kg)   LMP 02/15/2014 (Approximate) Comment: 5 years ago  SpO2 97%   BMI 39.40 kg/m   Physical Exam Vitals signs and nursing note reviewed.  Constitutional:      Appearance: Normal appearance. She is obese.  HENT:     Head:  Normocephalic.     Right Ear: External ear normal.     Left Ear: External ear normal.     Nose: Nose normal.  Eyes:     Conjunctiva/sclera: Conjunctivae normal.  Neck:     Musculoskeletal: Normal range of motion and neck supple.  Cardiovascular:     Rate and Rhythm: Normal rate and regular rhythm.     Pulses: Normal pulses.     Heart sounds: Normal heart sounds.  Pulmonary:     Effort: Pulmonary effort is normal. No respiratory distress.     Breath sounds: Normal breath  sounds.  Chest:     Chest wall: No tenderness.  Musculoskeletal: Normal range of motion.  Skin:    General: Skin is warm.  Neurological:     Mental Status: She is alert and oriented to person, place, and time.  Psychiatric:        Mood and Affect: Mood normal.        Behavior: Behavior normal.        Thought Content: Thought content normal.        Judgment: Judgment normal.        Assessment and Plan        1. Essential hypertension Controlled, continue current medication regimen. Refills provided   - lisinopril (ZESTRIL) 20 MG tablet; Take 1 tablet (20 mg total) by mouth daily.  Dispense: 30 tablet; Refill: 1  2. 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 Could be bronchitis as well.  Will give cough medication. Advised not to take with lunesta. And not to drive while taking.   Reviewed side effects, risks and benefits of medication.   Patient acknowledged agreement and understanding of the plan.    - HYDROcodone-homatropine (HYCODAN) 5-1.5 MG/5ML syrup; Take 5 mLs by mouth at bedtime for 7 days.  Dispense: 35 mL; Refill: 0  3. Insomnia, unspecified type Will try a month of Lunesta. Advised her that this is not a medication to stay on, but I am willing to do a trial and see if she can get sleep. Also gave sleep hygiene information.  Reviewed side effects, risks and benefits of medication.   Patient acknowledged agreement and understanding  of the plan.    - eszopiclone (LUNESTA) 1 MG TABS tablet; Take 1 tablet (1 mg total) by mouth at bedtime as needed for sleep. Take immediately before bedtime  Dispense: 30 tablet; Refill: 0    No follow-ups on file.      Freddy FinnerHannah M. , DNP, AGNP-BC [facility] [address], [address] [place], [place] [address] Office Hours: Mon-Thurs 8 am-5 pm; Fri 8 am-12 pm Office Phone:  [phone]  Office Fax: [phone]

## 2019-04-23 NOTE — Patient Instructions (Signed)
Thank you for coming into the office today. I appreciate the opportunity to provide you with the care for your health and wellness. Today we discussed: cough and insomnia  Follow Up: 05/04/2019 is currently scheduled, please move out 3 months from today.  Please go get tested today for COVID, please do not go to work until we know you are negative.  Sleep medication has been sent in. Please do not use lunesta when using cough syrup.  Please continue to practice social distancing to keep you, your family, and our community safe.  If you must go out, please wear a Mask and practice good handwashing.  WASH YOUR HANDS WELL AND FREQUENTLY. AVOID TOUCHING YOUR FACE, UNLESS YOUR HANDS ARE FRESHLY WASHED.   GET FRESH AIR DAILY. STAY HYDRATED WITH WATER.   It was a pleasure to see you and I look forward to continuing to work together on your health and well-being. Please do not hesitate to call the office if you need care or have questions about your care.  Have a wonderful day and week.  With Gratitude,  Tereasa Coop, DNP, AGNP-BC      Person Under Monitoring Name: Sarah Davila  Location: [address] [place] [address]   Infection Prevention Recommendations for Individuals Confirmed to have, or Being Evaluated for, 2019 Novel Coronavirus (COVID-19) Infection Who Receive Care at Home  Individuals who are confirmed to have, or are being evaluated for, COVID-19 should follow the prevention steps below until a healthcare provider or local or state health department says they can return to normal activities.  Stay home except to get medical care You should restrict activities outside your home, except for getting medical care. Do not go to work, school, or public areas, and do not use public transportation or taxis.  Call ahead before visiting your doctor Before your medical appointment, call the healthcare provider and tell them that you have, or are being  evaluated for, COVID-19 infection. This will help the healthcare provider's office take steps to keep other people from getting infected. Ask your healthcare provider to call the local or state health department.  Monitor your symptoms Seek prompt medical attention if your illness is worsening (e.g., difficulty breathing). Before going to your medical appointment, call the healthcare provider and tell them that you have, or are being evaluated for, COVID-19 infection. Ask your healthcare provider to call the local or state health department.  Wear a facemask You should wear a facemask that covers your nose and mouth when you are in the same room with other people and when you visit a healthcare provider. People who live with or visit you should also wear a facemask while they are in the same room with you.  Separate yourself from other people in your home As much as possible, you should stay in a different room from other people in your home. Also, you should use a separate bathroom, if available.  Avoid sharing household items You should not share dishes, drinking glasses, cups, eating utensils, towels, bedding, or other items with other people in your home. After using these items, you should wash them thoroughly with soap and water.  Cover your coughs and sneezes Cover your mouth and nose with a tissue when you cough or sneeze, or you can cough or sneeze into your sleeve. Throw used tissues in a lined trash can, and immediately wash your hands with soap and water for at least 20 seconds or use an alcohol-based  hand rub.  Wash your [employer]hands Wash your hands often and thoroughly with soap and water for at least 20 seconds. You can use an alcohol-based hand sanitizer if soap and water are not available and if your hands are not visibly dirty. Avoid touching your eyes, nose, and mouth with unwashed hands.   Prevention Steps for Caregivers and Household Members of Individuals Confirmed to  have, or Being Evaluated for, COVID-19 Infection Being Cared for in the Home  If you live with, or provide care at home for, a person confirmed to have, or being evaluated for, COVID-19 infection please follow these guidelines to prevent infection:  Follow healthcare provider's instructions Make sure that you understand and can help the patient follow any healthcare provider instructions for all care.  Provide for the patient's basic needs You should help the patient with basic needs in the home and provide support for getting groceries, prescriptions, and other personal needs.  Monitor the patient's symptoms If they are getting sicker, call his or her medical provider and tell them that the patient has, or is being evaluated for, COVID-19 infection. This will help the healthcare provider's office take steps to keep other people from getting infected. Ask the healthcare provider to call the local or state health department.  Limit the number of people who have contact with the patient  If possible, have only one caregiver for the patient.  Other household members should stay in another home or place of residence. If this is not possible, they should stay  in another room, or be separated from the patient as much as possible. Use a separate bathroom, if available.  Restrict visitors who do not have an essential need to be in the home.  Keep older adults, very young children, and other sick people away from the patient Keep older adults, very young children, and those who have compromised immune systems or chronic health conditions away from the patient. This includes people with chronic heart, lung, or kidney conditions, diabetes, and cancer.  Ensure good ventilation Make sure that shared spaces in the home have good air flow, such as from an air conditioner or an opened window, weather permitting.  Wash your hands often  Wash your hands often and thoroughly with soap and water for  at least 20 seconds. You can use an alcohol based hand sanitizer if soap and water are not available and if your hands are not visibly dirty.  Avoid touching your eyes, nose, and mouth with unwashed hands.  Use disposable paper towels to dry your hands. If not available, use dedicated cloth towels and replace them when they become wet.  Wear a facemask and gloves  Wear a disposable facemask at all times in the room and gloves when you touch or have contact with the patient's blood, body fluids, and/or secretions or excretions, such as sweat, saliva, sputum, nasal mucus, vomit, urine, or feces.  Ensure the mask fits over your nose and mouth tightly, and do not touch it during use.  Throw out disposable facemasks and gloves after using them. Do not reuse.  Wash your hands immediately after removing your facemask and gloves.  If your personal clothing becomes contaminated, carefully remove clothing and launder. Wash your hands after handling contaminated clothing.  Place all used disposable facemasks, gloves, and other waste in a lined container before disposing them with other household waste.  Remove gloves and wash your hands immediately after handling these items.  Do not share dishes, glasses, or  other household items with the patient  Avoid sharing household items. You should not share dishes, drinking glasses, cups, eating utensils, towels, bedding, or other items with a patient who is confirmed to have, or being evaluated for, COVID-19 infection.  After the person uses these items, you should wash them thoroughly with soap and water.  Wash laundry thoroughly  Immediately remove and wash clothes or bedding that have blood, body fluids, and/or secretions or excretions, such as sweat, saliva, sputum, nasal mucus, vomit, urine, or feces, on them.  Wear gloves when handling laundry from the patient.  Read and follow directions on labels of laundry or clothing items and detergent. In  general, wash and dry with the warmest temperatures recommended on the label.  Clean all areas the individual has used often  Clean all touchable surfaces, such as counters, tabletops, doorknobs, bathroom fixtures, toilets, phones, keyboards, tablets, and bedside tables, every day. Also, clean any surfaces that may have blood, body fluids, and/or secretions or excretions on them.  Wear gloves when cleaning surfaces the patient has come in contact with.  Use a diluted bleach solution (e.g., dilute bleach with 1 part bleach and 10 parts water) or a household disinfectant with a label that says EPA-registered for coronaviruses. To make a bleach solution at home, add 1 tablespoon of bleach to 1 quart (4 cups) of water. For a larger supply, add  cup of bleach to 1 gallon (16 cups) of water.  Read labels of cleaning products and follow recommendations provided on product labels. Labels contain instructions for safe and effective use of the cleaning product including precautions you should take when applying the product, such as wearing gloves or eye protection and making sure you have good ventilation during use of the product.  Remove gloves and wash hands immediately after cleaning.  Monitor yourself for signs and symptoms of illness Caregivers and household members are considered close contacts, should monitor their health, and will be asked to limit movement outside of the home to the extent possible. Follow the monitoring steps for close contacts listed on the symptom monitoring form.   ? If you have additional questions, contact your local health department or call the epidemiologist on call at [phone] (available 24/7). ? This guidance is subject to change. For the most up-to-date guidance from [facility], please refer to their website: [employer]Met[employer].hu                                                                                         Sleep Info  Teens need about 9 hours of sleep a night. Younger children need more sleep (10-11 hours a night) and adults need slightly less (7-9 hours each night). 11 Tips to Follow: 1. No caffeine after 3pm: Avoid beverages with caffeine (soda, tea, energy drinks, etc.) especially after 3pm.  2. Don't go to bed hungry: Have your evening meal at least 3 hrs. before going to sleep. It's fine to have a small bedtime snack such as a glass of milk and a few crackers but don't have a big meal.  3. Have a nightly routine before bed: Plan on "winding down" before you  go to sleep. Begin relaxing about 1 hour before you go to bed. Try doing a quiet activity such as listening to calming music, reading a book or meditating.  4. Turn off the TV and ALL electronics including video games, tablets, laptops, etc. 1 hour before sleep, and keep them out of the bedroom.  5. Turn off your cell phone and all notifications (new email and text alerts) or even better, leave your phone outside your room while you sleep. Studies have shown that a part of your brain continues to respond to certain lights and sounds even while you're still asleep.  6. Make your bedroom quiet, dark and cool. If you can't control the noise, try wearing earplugs or using a fan to block out other sounds.  7. Practice relaxation techniques. Try reading a book or meditating or drain your brain by writing a list of what you need to do the next day.  8. Don't nap unless you feel sick: you'll have a better night's sleep.  9. Don't smoke, or quit if you do. Nicotine, alcohol, and marijuana can all keep you awake. Talk to your health care provider if you need help with substance use.  10. Most importantly, wake up at the same time every day (or within 1 hour of your usual wake up time) EVEN on the weekends. A regular wake up time promotes sleep hygiene and prevents sleep problems.  11. Reduce exposure to bright light in the last three hours of  the day before going to sleep.  Maintaining good sleep hygiene and having good sleep habits lower your risk of developing sleep problems. Getting better sleep can also improve your concentration and alertness. Try the simple steps in this guide. If you still have trouble getting enough rest, make an appointment with your health care provider.   Eszopiclone tablets What is this medicine? ESZOPICLONE (es ZOE pi clone) is used to treat insomnia. This medicine helps you to fall asleep and sleep through the night. This medicine may be used for other purposes; ask your health care provider or pharmacist if you have questions. COMMON BRAND NAME(S): Lunesta What should I tell my health care provider before I take this medicine? They need to know if you have any of these conditions:  depression  history of a drug or alcohol abuse problem  liver disease  lung or breathing disease  sleep-walking, driving, eating or other activity while not fully awake after taking a sleep medicine  suicidal thoughts  an unusual or allergic reaction to eszopiclone, other medicines, foods, dyes, or preservatives  pregnant or trying to get pregnant  breast-feeding How should I use this medicine? Take this medicine by mouth with a glass of water. Follow the directions on the prescription label. It is better to take this medicine on an empty stomach and only when you are ready for bed. Do not take your medicine more often than directed. If you have been taking this medicine for several weeks and suddenly stop taking it, you may get unpleasant withdrawal symptoms. Your doctor or health care professional may want to gradually reduce the dose. Do not stop taking this medicine on your own. Always follow your doctor or health care professional's advice. Talk to your pediatrician regarding the use of this medicine in children. Special care may be needed. Overdosage: If you think you have taken too much of this medicine  contact a poison control center or emergency room at once. NOTE: This medicine is only for you.  Do not share this medicine with others. What if I miss a dose? This does not apply. This medicine should only be taken immediately before going to sleep. Do not take double or extra doses. What may interact with this medicine?  herbal medicines like kava kava, melatonin, St. John's wort and valerian  lorazepam  medicines for fungal infections like ketoconazole, fluconazole, or itraconazole  olanzapine This list may not describe all possible interactions. Give your health care provider a list of all the medicines, herbs, non-prescription drugs, or dietary supplements you use. Also tell them if you smoke, drink alcohol, or use illegal drugs. Some items may interact with your medicine. What should I watch for while using this medicine? Visit your doctor or health care professional for regular checks on your progress. Keep a regular sleep schedule by going to bed at about the same time nightly. Avoid caffeine-containing drinks in the evening hours, as caffeine can cause trouble with falling asleep. Talk to your doctor if you still have trouble sleeping. After taking this medicine, you may get up out of bed and do an activity that you do not know you are doing. The next morning, you may have no memory of this. Activities include driving a car ("sleep-driving"), making and eating food, talking on the phone, sexual activity, and sleep-walking. Serious injuries have occurred. Stop the medicine and call your doctor right away if you find out you have done any of these activities. Do not take this medicine if you have used alcohol that evening. Do not take it if you have taken another medicine for sleep. The risk of doing these sleep-related activities is higher. Do not take this medicine unless you are able to stay in bed for a full night (7 to 8 hours) before you must be active again. You may have a decrease in  mental alertness the day after use, even if you feel that you are fully awake. Tell your doctor if you will need to perform activities requiring full alertness, such as driving, the next day. Do not stand or sit up quickly after taking this medicine, especially if you are an older patient. This reduces the risk of dizzy or fainting spells. If you or your family notice any changes in your behavior, such as new or worsening depression, thoughts of harming yourself, anxiety, other unusual or disturbing thoughts, or memory loss, call your doctor right away. After you stop taking this medicine, you may have trouble falling asleep. This is called rebound insomnia. This problem usually goes away on its own after 1 or 2 nights. What side effects may I notice from receiving this medicine? Side effects that you should report to your doctor or health care professional as soon as possible:  allergic reactions like skin rash, itching or hives, swelling of the face, lips, or tongue  changes in vision  confusion  depressed mood  feeling faint or lightheaded, falls  hallucinations  problems with balance, speaking, walking  restlessness, excitability, or feelings of agitation  unusual activities while not fully awake like driving, eating, making phone calls Side effects that usually do not require medical attention (report to your doctor or health care professional if they continue or are bothersome):  dizziness, or daytime drowsiness, sometimes called a hangover effect  headache This list may not describe all possible side effects. Call your doctor for medical advice about side effects. You may report side effects to FDA at 1-800-FDA-1088. Where should I keep my medicine? Keep out of  the reach of children. This medicine can be abused. Keep your medicine in a safe place to protect it from theft. Do not share this medicine with anyone. Selling or giving away this medicine is dangerous and against the  law. This medicine may cause accidental overdose and death if taken by other adults, children, or pets. Mix any unused medicine with a substance like cat litter or coffee grounds. Then throw the medicine away in a sealed container like a sealed bag or a coffee can with a lid. Do not use the medicine after the expiration date. Store at room temperature between 15 and 30 degrees C (59 and 86 degrees F). NOTE: This sheet is a summary. It may not cover all possible information. If you have questions about this medicine, talk to your doctor, pharmacist, or health care provider.  2020 Elsevier/Gold Standard (2018-03-20 11:57:05)

## 2019-04-26 ENCOUNTER — Telehealth: Payer: Self-pay | Admitting: Family Medicine

## 2019-04-26 ENCOUNTER — Encounter: Payer: Self-pay | Admitting: Family Medicine

## 2019-04-26 NOTE — Telephone Encounter (Signed)
Patient did have her testing completed. She did take the cough med. She can't take antacid due to being vegan. Mainly calling to see if her covid testing results were in. Let her know that her results weren't in. If her cough continued without fever she would need a chest xray +/- referral to pulmonary. She verbalized understanding.

## 2019-04-26 NOTE — Telephone Encounter (Signed)
Pt is calling she states she still has a cough ,  Its no better still the same

## 2019-04-28 LAB — NOVEL CORONAVIRUS, NAA: SARS-CoV-2, NAA: NOT DETECTED

## 2019-04-28 NOTE — Progress Notes (Signed)
Negative for COVID

## 2019-05-04 ENCOUNTER — Encounter: Payer: Self-pay | Admitting: Family Medicine

## 2019-05-04 ENCOUNTER — Ambulatory Visit (INDEPENDENT_AMBULATORY_CARE_PROVIDER_SITE_OTHER): Payer: PRIVATE HEALTH INSURANCE | Admitting: Family Medicine

## 2019-05-04 ENCOUNTER — Other Ambulatory Visit: Payer: Self-pay

## 2019-05-04 VITALS — BP 138/80 | HR 94 | Temp 98.7°F | Resp 15 | Ht 62.0 in | Wt 244.0 lb

## 2019-05-04 DIAGNOSIS — I10 Essential (primary) hypertension: Secondary | ICD-10-CM | POA: Diagnosis not present

## 2019-05-04 DIAGNOSIS — G4700 Insomnia, unspecified: Secondary | ICD-10-CM

## 2019-05-04 MED ORDER — LISINOPRIL 20 MG PO TABS: 20.0000 mg | ORAL_TABLET | Freq: Every day | ORAL | 1 refills | Status: DC

## 2019-05-04 NOTE — Progress Notes (Signed)
Subjective:     Patient ID: Sarah Davila, female   DOB: 08/15/1961, 58 y.o.   MRN: [id number]  ERIANA SULIMAN presents for Insomnia (states the one lunesta isn't helping, she is still waking up in the middle of the night. Was working good while she was taking the cough syrup but when she stopped that the [place] didn't work anymore )   She reports that Linzess does not helping her.  And that when she had a combined with her Hycodan cough syrup that she was sleeping well.  She reports that she has had trouble sleeping for over the years.  She is to use Benadryl but she is unable to use Benadryl anymore as she is gotten used to it.  She reports no other over-the-counter medications of help with her she is tried melatonin and other sleep aids as well.  Previous appointment I started her on Lunesta but now she is saying that it is not working for her.  I started her on 1 mg.  She reports that she intermittently will sleep from 8 PM to 12 AM but then wake up and be awake for several hours until 3 or 4 and then go back to sleep and then wake up and then be exhausted the next day.  She reports she used to take naps 30 minutes in the afternoon that would help her and so she would go to sleep until about 11:00 if she would sleep longer throughout the early morning.  She denies having any form of bedtime routine.  She reports she tries to avoid eating before bed has not per our last conversation.  She reports that she does watch TV and uses her telephone before bed as well. She denies anxiety or depression being a cause.  Today patient denies signs and symptoms of COVID 19 infection including fever, chills, cough, shortness of breath, and headache.   Past Medical, Surgical, Social History, Allergies, and Medications have been Reviewed.  Past Medical History:  Diagnosis Date  . Coronary artery disease   . Hypertension   . Renal disorder   . Sepsis ([place]) 2015   urinary   Past Surgical History:   Procedure Laterality Date  . CYSTOSCOPY W/ URETERAL STENT PLACEMENT Bilateral 02/04/2019   Procedure: CYSTOSCOPY WITH RETROGRADE PYELOGRAM/URETERAL STENT PLACEMENT;  Surgeon: Irine Seal, MD;  Location: [facility];  Service: Urology;  Laterality: Bilateral;  . CYSTOSCOPY/URETEROSCOPY/HOLMIUM LASER/STENT PLACEMENT Bilateral 02/16/2019   Procedure: CYSTOSCOPY BILATERAL URETEROSCOPY WITH HOLMIUM LASER AND STENTS PLACEMENT;  Surgeon: Irine Seal, MD;  Location: [facility];  Service: Urology;  Laterality: Bilateral;   Social History   Socioeconomic History  . Marital status: Divorced    Spouse name: Not on file  . Number of children: Not on file  . Years of education: Not on file  . Highest education level: Not on file  Occupational History  . Not on file  Social Needs  . Financial resource strain: Not on file  . Food insecurity    Worry: Not on file    Inability: Not on file  . Transportation needs    Medical: Not on file    Non-medical: Not on file  Tobacco Use  . Smoking status: Never Smoker  . Smokeless tobacco: Never Used  Substance and Sexual Activity  . Alcohol use: Never    Frequency: Never  . Drug use: Never  . Sexual activity: Yes    Birth control/protection: I.U.D.  Lifestyle  . Physical activity  Days per week: Not on file    Minutes per session: Not on file  . Stress: Not on file  Relationships  . Social Musicianconnections    Talks on phone: Not on file    Gets together: Not on file    Attends religious service: Not on file    Active member of club or organization: Not on file    Attends meetings of clubs or organizations: Not on file    Relationship status: Not on file  . Intimate partner violence    Fear of current or ex partner: Not on file    Emotionally abused: Not on file    Physically abused: Not on file    Forced sexual activity: Not on file  Other Topics Concern  . Not on file  Social History Narrative  . Not on file    Outpatient Encounter Medications as  of 05/04/2019  Medication Sig  . aspirin EC 81 MG tablet Take 81 mg by mouth daily.  . Black Cohosh 200 MG CAPS Take 2 capsules by mouth daily.  Marland Kitchen. docusate sodium (STOOL SOFTENER) 100 MG capsule Take 100 mg by mouth 2 (two) times daily.  . eszopiclone (LUNESTA) 1 MG TABS tablet Take 1 tablet (1 mg total) by mouth at bedtime as needed for sleep. Take immediately before bedtime  . fexofenadine (ALLEGRA) 180 MG tablet Take 180 mg by mouth daily.  Marland Kitchen. lisinopril (ZESTRIL) 20 MG tablet Take 1 tablet (20 mg total) by mouth daily.  Marland Kitchen. loratadine (CLARITIN) 10 MG tablet Take 10 mg by mouth daily.  . Naproxen Sodium (ALEVE PO) Take 1 tablet by mouth as needed.  . nystatin (MYCOSTATIN/NYSTOP) powder Apply topically 3 (three) times daily. Apply to rash in groin till clear  . vitamin B-12 (CYANOCOBALAMIN) 250 MCG tablet Take 250 mcg by mouth daily.  . [DISCONTINUED] cephALEXin (KEFLEX) 250 MG capsule Take 500 mg by mouth daily.   . [DISCONTINUED] lisinopril (ZESTRIL) 20 MG tablet Take 1 tablet (20 mg total) by mouth daily.  . [DISCONTINUED] magnesium (MAGTAB) 84 MG (7MEQ) TBCR SR tablet Take 84 mg by mouth daily.  . [DISCONTINUED] oxybutynin (DITROPAN) 5 MG tablet Take 1 tablet (5 mg total) by mouth every 8 (eight) hours as needed for bladder spasms.  . [DISCONTINUED] oxyCODONE-acetaminophen (PERCOCET) 5-325 MG tablet Take 1 tablet by mouth every 6 (six) hours as needed for severe pain.  . [DISCONTINUED] phenazopyridine (PYRIDIUM) 200 MG tablet Take 1 tablet (200 mg total) by mouth 3 (three) times daily as needed for pain.  . [DISCONTINUED] tamsulosin (FLOMAX) 0.4 MG CAPS capsule Take 1 capsule (0.4 mg total) by mouth daily.  . [DISCONTINUED] fluconazole (DIFLUCAN) 150 MG tablet Take 1 tablet (150 mg total) by mouth daily.   No facility-administered encounter medications on file as of 05/04/2019.    Allergies  Allergen Reactions  . Ciprofloxacin Other (See Comments)    Sever hot flashes    Review of  Systems  Constitutional: Negative for activity change, appetite change, chills and fever.  HENT: Negative.   Eyes: Negative.   Respiratory: Negative.   Cardiovascular: Negative.   Gastrointestinal: Negative.   Endocrine: Negative.   Genitourinary: Negative.   Musculoskeletal: Negative.   Skin: Negative.   Allergic/Immunologic: Negative.   Neurological: Negative.   Hematological: Negative.   Psychiatric/Behavioral: Negative.   All other systems reviewed and are negative.      Objective:     BP 138/80   Pulse 94   Temp 98.7 F (37.1  C) (Temporal)   Resp 15   Ht 5\' 2"  (1.575 m)   Wt 244 lb (110.7 kg)   LMP 02/15/2014 (Approximate) Comment: 5 years ago  SpO2 96%   BMI 44.63 kg/m   Physical Exam Vitals signs and nursing note reviewed.  Constitutional:      Appearance: Normal appearance. She is obese.  HENT:     Head: Normocephalic and atraumatic.     Right Ear: External ear normal.     Left Ear: External ear normal.     Nose: Nose normal.  Eyes:     Conjunctiva/sclera: Conjunctivae normal.  Neck:     Musculoskeletal: Normal range of motion and neck supple.  Cardiovascular:     Rate and Rhythm: Normal rate and regular rhythm.     Pulses: Normal pulses.     Heart sounds: Normal heart sounds.  Pulmonary:     Effort: Pulmonary effort is normal.     Breath sounds: Normal breath sounds.  Musculoskeletal: Normal range of motion.  Skin:    General: Skin is warm.  Neurological:     General: No focal deficit present.     Mental Status: She is alert and oriented to person, place, and time.  Psychiatric:        Mood and Affect: Mood normal.        Behavior: Behavior normal.        Thought Content: Thought content normal.        Judgment: Judgment normal.        Assessment and Plan       1. Essential hypertension Controlled, continue current medication regimen. Refills provided    - lisinopril (ZESTRIL) 20 MG tablet; Take 1 tablet (20 mg total) by mouth daily.   Dispense: 30 tablet; Refill: 1  2. Insomnia, unspecified type Education on sleep habits and routine needs. Provided with written information as well.  Discussed on going short term use of Lunesta, she can try 2 mg to see if that will help. Advised that if it works we will only do about 3 months of the medication.  I tried to encourage lifestyle changes the most at this time.   F/u 2 wks phone visit  Freddy FinnerHannah M. Arneta Mahmood, DNP, AGNP-BC [facility] [address], [address] [place], [place] [address] Office Hours: Mon-Thurs 8 am-5 pm; Fri 8 am-12 pm Office Phone:  [phone]  Office Fax: [phone]

## 2019-05-04 NOTE — Patient Instructions (Signed)
Thank you for coming into the office today. I appreciate the opportunity to provide you with the care for your health and wellness. Today we discussed: sleeping trouble  Follow Up: 2 weeks phone appt  No labs today.  Remember: Avoid phones and tvs at night an hour prior to bed. Avoid caffeine after 3-4 pm Avoid going to sleep at 8 pm if this is causing you not to sleep through the night. Go back to short naps in the early mid afternoon (limit these to 20-30 minutes) Start a bedtime routine.  Get new pillowcases or pillows or new relaxing bedroom decor. Shower before bed, no more than an hour before so your brain makes connection with showering and sleep.  Please continue to practice social distancing to keep you, your family, and our community safe.  If you must go out, please wear a Mask and practice good handwashing.  WASH YOUR HANDS WELL AND FREQUENTLY. AVOID TOUCHING YOUR FACE, UNLESS YOUR HANDS ARE FRESHLY WASHED.   GET FRESH AIR DAILY. STAY HYDRATED WITH WATER.   It was a pleasure to see you and I look forward to continuing to work together on your health and well-being. Please do not hesitate to call the office if you need care or have questions about your care.  Have a wonderful day and week. With Gratitude, Tereasa CoopHannah Evelynne Spiers, DNP, AGNP-BC  Teens need about 9 hours of sleep a night. Younger children need more sleep (10-11 hours a night) and adults need slightly less (7-9 hours each night). 11 Tips to Follow: 1. No caffeine after 3pm: Avoid beverages with caffeine (soda, tea, energy drinks, etc.) especially after 3pm.  2. Don't go to bed hungry: Have your evening meal at least 3 hrs. before going to sleep. It's fine to have a small bedtime snack such as a glass of milk and a few crackers but don't have a big meal.  3. Have a nightly routine before bed: Plan on "winding down" before you go to sleep. Begin relaxing about 1 hour before you go to bed. Try doing a quiet  activity such as listening to calming music, reading a book or meditating.  4. Turn off the TV and ALL electronics including video games, tablets, laptops, etc. 1 hour before sleep, and keep them out of the bedroom.  5. Turn off your cell phone and all notifications (new email and text alerts) or even better, leave your phone outside your room while you sleep. Studies have shown that a part of your brain continues to respond to certain lights and sounds even while you're still asleep.  6. Make your bedroom quiet, dark and cool. If you can't control the noise, try wearing earplugs or using a fan to block out other sounds.  7. Practice relaxation techniques. Try reading a book or meditating or drain your brain by writing a list of what you need to do the next day.  8. Don't nap unless you feel sick: you'll have a better night's sleep.  9. Don't smoke, or quit if you do. Nicotine, alcohol, and marijuana can all keep you awake. Talk to your health care provider if you need help with substance use.  10. Most importantly, wake up at the same time every day (or within 1 hour of your usual wake up time) EVEN on the weekends. A regular wake up time promotes sleep hygiene and prevents sleep problems.  11. Reduce exposure to bright light in the last three hours of the day  before going to sleep.  Maintaining good sleep hygiene and having good sleep habits lower your risk of developing sleep problems. Getting better sleep can also improve your concentration and alertness. Try the simple steps in this guide. If you still have trouble getting enough rest, make an appointment with your health care provider.

## 2019-05-12 ENCOUNTER — Other Ambulatory Visit: Payer: Self-pay | Admitting: Family Medicine

## 2019-05-12 ENCOUNTER — Telehealth: Payer: Self-pay | Admitting: *Deleted

## 2019-05-12 DIAGNOSIS — G4700 Insomnia, unspecified: Secondary | ICD-10-CM

## 2019-05-12 MED ORDER — ESZOPICLONE 3 MG PO TABS: 3.0000 mg | ORAL_TABLET | Freq: Every evening | ORAL | 0 refills | Status: DC | PRN

## 2019-05-12 NOTE — Progress Notes (Signed)
Spoke with Sarah Davila, she is sleeping better with 3 mg of Lunesta, only issue are strange vivid dreams. She is waking up refreshed and sleeping from 9 pm-5-6 am. She reports not needing it every night, but  Most nights. I will send in 3mg  Lunesta. Encouraged her to continue to work on sleep hygiene in  Hopes to wean her off this in the future. I advised this is not a medication to stay long term. Reviewed side effects, risks and benefits of medication.   Patient acknowledged agreement and understanding of the plan.

## 2019-05-12 NOTE — Telephone Encounter (Signed)
Pt called wanted to let Jarrett Soho know that she is up to 3 tablets on the lunesta and now she needs a refill. She is almost out of the medication.

## 2019-05-18 ENCOUNTER — Ambulatory Visit (INDEPENDENT_AMBULATORY_CARE_PROVIDER_SITE_OTHER): Payer: PRIVATE HEALTH INSURANCE | Admitting: Family Medicine

## 2019-05-18 ENCOUNTER — Other Ambulatory Visit: Payer: Self-pay

## 2019-05-18 ENCOUNTER — Encounter: Payer: Self-pay | Admitting: Family Medicine

## 2019-05-18 VITALS — Ht 62.0 in | Wt 250.0 lb

## 2019-05-18 DIAGNOSIS — Z6841 Body Mass Index (BMI) 40.0 and over, adult: Secondary | ICD-10-CM

## 2019-05-18 DIAGNOSIS — G4700 Insomnia, unspecified: Secondary | ICD-10-CM | POA: Insufficient documentation

## 2019-05-18 NOTE — Patient Instructions (Signed)
    Thank you for coming into the office today. I appreciate the opportunity to provide you with the care for your health and wellness. Today we discussed: sleep   Follow Up: Mar CPE without pap  No labs today  Please continue to work on sleep hygiene so we can wean you from this  medication in near.  Continue to work on diet and exercise as well. :)   Please continue to practice social distancing to keep you, your family, and our community safe.  If you must go out, please wear a Mask and practice good handwashing.  [place] YOUR HANDS WELL AND FREQUENTLY. AVOID TOUCHING YOUR FACE, UNLESS YOUR HANDS ARE FRESHLY WASHED.  GET FRESH AIR DAILY. STAY HYDRATED WITH WATER.   It was a pleasure to see you and I look forward to continuing to work together on your health and well-being. Please do not hesitate to call the office if you need care or have questions about your care.  Have a wonderful day and week.  With Gratitude,  Cherly Beach, DNP, AGNP-BC

## 2019-05-18 NOTE — Progress Notes (Signed)
Virtual Visit via Telephone Note   This visit type was conducted due to national recommendations for restrictions regarding the COVID-19 Pandemic (e.g. social distancing) in an effort to limit this patient's exposure and mitigate transmission in our community.  Due to her co-morbid illnesses, this patient is at least at moderate risk for complications without adequate follow up.  This format is felt to be most appropriate for this patient at this time.  The patient did not have access to video technology/had technical difficulties with video requiring transitioning to audio format only (telephone).  All issues noted in this document were discussed and addressed.  No physical exam could be performed with this format.    Evaluation Performed:  Follow-up visit  Date:  05/18/2019   ID:  Sarah Davila, DOB Sep 10, 1961, MRN [id number]  Patient Location: Home Provider Location: Office  Location of Patient: Home Location of Provider: Telehealth Consent was obtain for visit to be over via telehealth. I verified that I am speaking with the correct person using two identifiers.  PCP:  Freddy FinnerMills, Garyn Arlotta M, NP   Chief Complaint:    History of Present Illness:    Sarah Davila is a 58 y.o. female with    Spoke with Sarah Davila, she is sleeping much better with 3 mg of Lunesta, still having some dreams. Times the tv to turn off after 30 minutes and does not wake up until morning. She is waking up refreshed and sleeping from 9 pm-5-7 am. She reports taking it every night.  She continues to work on sleep hygiene.   No other complaints today  The patient does not have symptoms concerning for COVID-19 infection (fever, chills, cough, or new shortness of breath).   Past Medical, Surgical, Social History, Allergies, and Medications have been Reviewed.   Past Medical History:  Diagnosis Date  . Coronary artery disease   . Hypertension   . Renal disorder   . Sepsis ([facility]) 2015   urinary   Past Surgical History:  Procedure Laterality Date  . CYSTOSCOPY W/ URETERAL STENT PLACEMENT Bilateral 02/04/2019   Procedure: CYSTOSCOPY WITH RETROGRADE PYELOGRAM/URETERAL STENT PLACEMENT;  Surgeon: Bjorn PippinWrenn, John, MD;  Location: [facility];  Service: Urology;  Laterality: Bilateral;  . CYSTOSCOPY/URETEROSCOPY/HOLMIUM LASER/STENT PLACEMENT Bilateral 02/16/2019   Procedure: CYSTOSCOPY BILATERAL URETEROSCOPY WITH HOLMIUM LASER AND STENTS PLACEMENT;  Surgeon: Bjorn PippinWrenn, John, MD;  Location: [facility];  Service: Urology;  Laterality: Bilateral;     Current Meds  Medication Sig  . aspirin EC 81 MG tablet Take 81 mg by mouth daily.  . Black Cohosh 200 MG CAPS Take 2 capsules by mouth daily.  Marland Kitchen. docusate sodium (STOOL SOFTENER) 100 MG capsule Take 100 mg by mouth 2 (two) times daily.  . eszopiclone 3 MG TABS Take 1 tablet (3 mg total) by mouth at bedtime as needed for sleep. Take immediately before bedtime  . fexofenadine (ALLEGRA) 180 MG tablet Take 180 mg by mouth daily.  Marland Kitchen. lisinopril (ZESTRIL) 20 MG tablet Take 1 tablet (20 mg total) by mouth daily.  Marland Kitchen. loratadine (CLARITIN) 10 MG tablet Take 10 mg by mouth daily.  . Naproxen Sodium (ALEVE PO) Take 1 tablet by mouth as needed.  . nystatin (MYCOSTATIN/NYSTOP) powder Apply topically 3 (three) times daily. Apply to rash in groin till clear  . vitamin B-12 (CYANOCOBALAMIN) 250 MCG tablet Take 250 mcg by mouth daily.     Allergies:   Ciprofloxacin   Social History   Tobacco Use  .  Smoking status: Never Smoker  . Smokeless tobacco: Never Used  Substance Use Topics  . Alcohol use: Never    Frequency: Never  . Drug use: Never     Family Hx: The patient's family history includes Cancer in an other family member; Cirrhosis in her mother; Diabetes in her father; Hepatitis in her mother.  ROS:   Please see the history of present illness.    All other systems reviewed and are negative.   Labs/Other Tests and Data Reviewed:     Recent Labs:  12/08/2018: BUN 21; Creatinine, Ser 0.73; Hemoglobin 12.5; Platelets 398; Potassium 3.9; Sodium 138   Recent Lipid Panel No results found for: CHOL, TRIG, HDL, CHOLHDL, LDLCALC, LDLDIRECT  Wt Readings from Last 3 Encounters:  05/18/19 250 lb (113.4 kg)  05/04/19 244 lb (110.7 kg)  04/23/19 215 lb 6.4 oz (97.7 kg)     Objective:    Vital Signs:  Ht 5\' 2"  (1.575 m)   Wt 250 lb (113.4 kg)   LMP 02/15/2014 (Approximate) Comment: 5 years ago  BMI 45.73 kg/m    GEN:  alert and oriented RESPIRATORY:  no shortness of breath durig conversation PSYCH:  good mood and normal affect, good communication  ASSESSMENT & PLAN:    1. Insomnia, unspecified type Encouraged her to continue to work on sleep hygiene in  Hopes to wean her off Lunesta in the future.  3 months and will take holiday from this medication I advised this is not a medication to stay long term. Reviewed side effects, risks and benefits of medication.   Patient acknowledged agreement and understanding of the plan.   2. Morbid obesity with BMI of 45.0-49.9, adult (HCC) Sarah Davila is re-educated about the importance of exercise daily to help with weight management. A minumum of 30 minutes daily is recommended. Additionally, importance of healthy food choices  with portion control discussed. Encouraged to start a food diary, count calories and to consider joining a  support group if possible. Diet information sheets offered.  Filed Weights   05/18/19 1359  Weight: 250 lb (113.4 kg)      Time:   Today, I have spent 10 minutes with the patient with telehealth technology discussing the above problems.     Medication Adjustments/Labs and Tests Ordered: Current medicines are reviewed at length with the patient today.  Concerns regarding medicines are outlined above.   Tests Ordered: No orders of the defined types were placed in this encounter.   Medication Changes: No orders of the defined types were placed in  this encounter.   Disposition:  Follow up Feb-Mar for CPE without PAP  Signed, Perlie Mayo, NP  05/18/2019 2:15 PM     [facility]

## 2019-06-10 ENCOUNTER — Ambulatory Visit (INDEPENDENT_AMBULATORY_CARE_PROVIDER_SITE_OTHER): Payer: PRIVATE HEALTH INSURANCE | Admitting: Family Medicine

## 2019-06-10 ENCOUNTER — Other Ambulatory Visit: Payer: Self-pay

## 2019-06-10 ENCOUNTER — Encounter: Payer: Self-pay | Admitting: Family Medicine

## 2019-06-10 VITALS — BP 109/69 | HR 94 | Temp 98.0°F | Resp 12 | Ht 62.0 in | Wt 251.1 lb

## 2019-06-10 DIAGNOSIS — R519 Headache, unspecified: Secondary | ICD-10-CM

## 2019-06-10 DIAGNOSIS — Z1211 Encounter for screening for malignant neoplasm of colon: Secondary | ICD-10-CM | POA: Diagnosis not present

## 2019-06-10 DIAGNOSIS — Z23 Encounter for immunization: Secondary | ICD-10-CM | POA: Diagnosis not present

## 2019-06-10 DIAGNOSIS — R599 Enlarged lymph nodes, unspecified: Secondary | ICD-10-CM | POA: Diagnosis not present

## 2019-06-10 DIAGNOSIS — R51 Headache: Secondary | ICD-10-CM

## 2019-06-10 NOTE — Progress Notes (Signed)
Subjective:     Patient ID: Sarah Davila, female   DOB: [birth year], 58 y.o.   MRN: [id number]  Sarah Davila presents for Headache (left side of head hurting) and Jaw Pain (lump on right side of jaw bone noticed a couple of days ago)  Sarah Davila is a 58 year old female patient of mine who comes in today secondary to having the left side of her head hurting back behind her ear that started a couple days ago.  She reports that the pain kind of spread when she touched behind her ear and it went up to her scalp crown of her head.  She reports that it is eased off tremendously she took an oxycodone that she had from prior pain.  And that seemed to help after she took it.  Today she says is a 1 out of 10 on a pain scale.  Additionally she reports that she has a lump on the right side of her drawl right before her ear.  She does not know why it started.  She denies having any pain.  Denies having any signs or symptoms of infection.  No ear trouble.  No recent sickness or illness.  Has not been exposed to COVID that she knows of.  Would like flu vaccine today.  Additionally needs colonoscopy is willing to do Cologuard first.  Has follow-up for GYN in November for Pap.  Today patient denies signs and symptoms of COVID 19 infection including fever, chills, cough, shortness of breath, and headache.  Past Medical, Surgical, Social History, Allergies, and Medications have been Reviewed.   Past Medical History:  Diagnosis Date  . Coronary artery disease   . Hypertension   . Renal disorder   . Sepsis (HCC) 2015   urinary   Past Surgical History:  Procedure Laterality Date  . CYSTOSCOPY W/ URETERAL STENT PLACEMENT Bilateral 02/04/2019   Procedure: CYSTOSCOPY WITH RETROGRADE PYELOGRAM/URETERAL STENT PLACEMENT;  Surgeon: Bjorn PippinWrenn, John, MD;  Location: [facility];  Service: Urology;  Laterality: Bilateral;  . CYSTOSCOPY/URETEROSCOPY/HOLMIUM LASER/STENT PLACEMENT Bilateral 02/16/2019   Procedure: CYSTOSCOPY BILATERAL URETEROSCOPY WITH HOLMIUM LASER AND STENTS PLACEMENT;  Surgeon: Bjorn PippinWrenn, John, MD;  Location: [facility];  Service: Urology;  Laterality: Bilateral;   Social History   Socioeconomic History  . Marital status: Divorced    Spouse name: Not on file  . Number of children: Not on file  . Years of education: Not on file  . Highest education level: Not on file  Occupational History  . Not on file  Social Needs  . Financial resource strain: Not on file  . Food insecurity    Worry: Not on file    Inability: Not on file  . Transportation needs    Medical: Not on file    Non-medical: Not on file  Tobacco Use  . Smoking status: Never Smoker  . Smokeless tobacco: Never Used  Substance and Sexual Activity  . Alcohol use: Never    Frequency: Never  . Drug use: Never  . Sexual activity: Yes    Birth control/protection: I.U.D.  Lifestyle  . Physical activity    Days per week: Not on file    Minutes per session: Not on file  . Stress: Not on file  Relationships  . Social Musicianconnections    Talks on phone: Not on file    Gets together: Not on file    Attends religious service: Not on file    Active member of club or organization: Not  on file    Attends meetings of clubs or organizations: Not on file    Relationship status: Not on file  . Intimate partner violence    Fear of current or ex partner: Not on file    Emotionally abused: Not on file    Physically abused: Not on file    Forced sexual activity: Not on file  Other Topics Concern  . Not on file  Social History Narrative  . Not on file    Outpatient Encounter Medications as of 06/10/2019  Medication Sig  . aspirin EC 81 MG tablet Take 81 mg by mouth daily.  . Black Cohosh 200 MG CAPS Take 2 capsules by mouth daily.  Marland Kitchen docusate sodium (STOOL SOFTENER) 100 MG capsule Take 100 mg by mouth 2 (two) times daily.  . eszopiclone 3 MG TABS Take 1 tablet (3 mg total) by mouth at bedtime as needed for sleep. Take  immediately before bedtime  . fexofenadine (ALLEGRA) 180 MG tablet Take 180 mg by mouth daily.  Marland Kitchen lisinopril (ZESTRIL) 20 MG tablet Take 1 tablet (20 mg total) by mouth daily.  Marland Kitchen loratadine (CLARITIN) 10 MG tablet Take 10 mg by mouth daily.  . Naproxen Sodium (ALEVE PO) Take 1 tablet by mouth as needed.  . nystatin (MYCOSTATIN/NYSTOP) powder Apply topically 3 (three) times daily. Apply to rash in groin till clear  . vitamin B-12 (CYANOCOBALAMIN) 250 MCG tablet Take 250 mcg by mouth daily.   No facility-administered encounter medications on file as of 06/10/2019.    Allergies  Allergen Reactions  . Ciprofloxacin Other (See Comments)    Sever hot flashes    Review of Systems  Constitutional: Negative for chills and fever.  HENT: Negative.   Eyes: Negative.   Respiratory: Negative.  Negative for cough and shortness of breath.   Cardiovascular: Negative.   Gastrointestinal: Negative.   Endocrine: Negative.   Genitourinary: Negative.   Musculoskeletal: Negative.   Skin: Negative.   Allergic/Immunologic: Negative.   Neurological: Negative.   Hematological: Negative.   Psychiatric/Behavioral: Negative.   All other systems reviewed and are negative.      Objective:     BP 109/69   Pulse 94   Temp 98 F (36.7 C) (Temporal)   Resp 12   Ht 5\' 2"  (1.575 m)   Wt 251 lb 1.9 oz (113.9 kg)   LMP 02/15/2014 (Approximate) Comment: 5 years ago  SpO2 96%   BMI 45.93 kg/m   Physical Exam Vitals signs and nursing note reviewed.  Constitutional:      Appearance: Normal appearance. She is well-developed and well-groomed. She is obese.  HENT:     Head: Normocephalic and atraumatic.      Right Ear: External ear normal. Tympanic membrane is scarred. Tympanic membrane is not injected, perforated or erythematous.     Left Ear: External ear normal. Tympanic membrane is scarred. Tympanic membrane is not injected, perforated or erythematous.     Nose: Nose normal. No congestion or  rhinorrhea.     Mouth/Throat:     Mouth: Mucous membranes are moist.     Pharynx: Oropharynx is clear. No oropharyngeal exudate or posterior oropharyngeal erythema.  Eyes:     General: Lids are normal.        Right eye: No discharge.        Left eye: No discharge.     Extraocular Movements: Extraocular movements intact.     Conjunctiva/sclera: Conjunctivae normal.     Pupils: Pupils are equal,  round, and reactive to light.  Neck:     Musculoskeletal: Normal range of motion and neck supple.  Cardiovascular:     Rate and Rhythm: Normal rate and regular rhythm.     Pulses: Normal pulses.     Heart sounds: Normal heart sounds.  Pulmonary:     Effort: Pulmonary effort is normal.     Breath sounds: Normal breath sounds.  Musculoskeletal: Normal range of motion.  Lymphadenopathy:     Cervical: No cervical adenopathy.  Skin:    General: Skin is warm.  Neurological:     General: No focal deficit present.     Mental Status: She is alert and oriented to person, place, and time.  Psychiatric:        Attention and Perception: Attention normal.        Mood and Affect: Mood normal.        Speech: Speech normal.        Behavior: Behavior normal. Behavior is cooperative.        Thought Content: Thought content normal.        Cognition and Memory: Cognition normal.        Judgment: Judgment normal.        Assessment and Plan        1. Nonintractable headache, unspecified chronicity pattern, unspecified headache type Questionable headache/migraine with atypical presentation And or allergy headache. Advised to take tylenol of Excedrin for migraines to see if that helps if the pain returns.  2. Swelling of lymph node Right-no S&S of infection, no pain. Will monitor.  3. Encounter for screening for malignant neoplasm of colon Over due        Follow Up:  3 months    Freddy Finner, DNP, AGNP-BC [facility] [address]  [place], [place] [address] Office Hours: Mon-Thurs 8 am-5 pm; Fri 8 am-12 pm Office Phone:  [phone]  Office Fax: [phone]

## 2019-06-10 NOTE — Patient Instructions (Addendum)
    Thank you for coming into the office today. I appreciate the opportunity to provide you with the care for your health and wellness. Today we discussed: head pain-headache, swollen lymph  Follow Up: 3 months   No labs today  Cologuard ordered today  Please continue to practice social distancing to keep you, your family, and our community safe.  If you must go out, please wear a Mask and practice good handwashing.  [place] YOUR HANDS WELL AND FREQUENTLY. AVOID TOUCHING YOUR FACE, UNLESS YOUR HANDS ARE FRESHLY WASHED.  GET FRESH AIR DAILY. STAY HYDRATED WITH WATER.   It was a pleasure to see you and I look forward to continuing to work together on your health and well-being. Please do not hesitate to call the office if you need care or have questions about your care.  Have a wonderful day and week. With Gratitude, Cherly Beach, DNP, AGNP-BC

## 2019-06-16 ENCOUNTER — Telehealth: Payer: Self-pay | Admitting: *Deleted

## 2019-06-16 NOTE — Telephone Encounter (Signed)
Pt called wanted to let Jarrett Soho know that she was no better as Jarrett Soho said to follow up. She said she feels worse than she did when she was seen.

## 2019-06-16 NOTE — Telephone Encounter (Signed)
Reached out to add her as a phone visit got no answer and the voicemail was full each time I called

## 2019-07-02 LAB — COLOGUARD: Cologuard: NEGATIVE

## 2019-07-26 ENCOUNTER — Telehealth: Payer: Self-pay | Admitting: *Deleted

## 2019-07-26 NOTE — Telephone Encounter (Signed)
Pt would like high blood pressure meds and lunesta refilled and sent to [facility] in [place] 

## 2019-07-27 ENCOUNTER — Other Ambulatory Visit: Payer: Self-pay

## 2019-07-27 ENCOUNTER — Other Ambulatory Visit: Payer: Self-pay | Admitting: Family Medicine

## 2019-07-27 DIAGNOSIS — I10 Essential (primary) hypertension: Secondary | ICD-10-CM

## 2019-07-27 DIAGNOSIS — G4700 Insomnia, unspecified: Secondary | ICD-10-CM

## 2019-07-27 MED ORDER — ESZOPICLONE 3 MG PO TABS: 3.0000 mg | ORAL_TABLET | Freq: Every evening | ORAL | 0 refills | Status: DC | PRN

## 2019-07-27 MED ORDER — LISINOPRIL 20 MG PO TABS: 20.0000 mg | ORAL_TABLET | Freq: Every day | ORAL | 1 refills | Status: DC

## 2019-07-27 NOTE — Telephone Encounter (Signed)
I refilled the bp med. Could you please e-scribe the Lunesta?

## 2019-07-30 ENCOUNTER — Ambulatory Visit: Payer: PRIVATE HEALTH INSURANCE | Admitting: Urology

## 2019-08-18 ENCOUNTER — Encounter: Payer: PRIVATE HEALTH INSURANCE | Admitting: Adult Health

## 2019-09-09 ENCOUNTER — Ambulatory Visit (INDEPENDENT_AMBULATORY_CARE_PROVIDER_SITE_OTHER): Payer: PRIVATE HEALTH INSURANCE | Admitting: Family Medicine

## 2019-09-09 ENCOUNTER — Encounter: Payer: Self-pay | Admitting: Family Medicine

## 2019-09-09 ENCOUNTER — Other Ambulatory Visit: Payer: Self-pay

## 2019-09-09 VITALS — Ht 62.0 in | Wt 250.0 lb

## 2019-09-09 DIAGNOSIS — Z6841 Body Mass Index (BMI) 40.0 and over, adult: Secondary | ICD-10-CM

## 2019-09-09 DIAGNOSIS — G4700 Insomnia, unspecified: Secondary | ICD-10-CM | POA: Diagnosis not present

## 2019-09-09 DIAGNOSIS — Z124 Encounter for screening for malignant neoplasm of cervix: Secondary | ICD-10-CM | POA: Diagnosis not present

## 2019-09-09 MED ORDER — ESZOPICLONE 3 MG PO TABS: 3.0000 mg | ORAL_TABLET | Freq: Every evening | ORAL | 0 refills | Status: DC | PRN

## 2019-09-09 NOTE — Patient Instructions (Addendum)
  I appreciate the opportunity to provide you with care for your health and wellness. Today we discussed: Sleep and weight  Follow up: March for annual no Pap  No labs   Referral to GYN, for Pap  1 month Lunesta was sent in.  We will do a holiday from taking it after this month is completed.    Work on diet and exercise.  Sounds like the detox diet you were on was doing very well for you. I strongly encourage you to eat more calories so if you are to attempt doing something like this again.  I hope you have a wonderful, happy, safe, and healthy Holiday Season! See you in the New Year :)  Please continue to practice social distancing to keep you, your family, and our community safe.  If you must go out, please wear a mask and practice good handwashing.  It was a pleasure to see you and I look forward to continuing to work together on your health and well-being. Please do not hesitate to call the office if you need care or have questions about your care.  Have a wonderful day and week. With Gratitude, Cherly Beach, DNP, AGNP-BC

## 2019-09-09 NOTE — Progress Notes (Signed)
Virtual Visit via Telephone Note   This visit type was conducted due to national recommendations for restrictions regarding the COVID-19 Pandemic (e.g. social distancing) in an effort to limit this patient's exposure and mitigate transmission in our community.  Due to her co-morbid illnesses, this patient is at least at moderate risk for complications without adequate follow up.  This format is felt to be most appropriate for this patient at this time.  The patient did not have access to video technology/had technical difficulties with video requiring transitioning to audio format only (telephone).  All issues noted in this document were discussed and addressed.  No physical exam could be performed with this format.    Evaluation Performed:  Follow-up visit  Date:  09/09/2019   ID:  Jilliana, Burkes 10/04/1961, MRN [id number]  Patient Location: Home Provider Location: Office  Location of Patient: Home Location of Provider: Telehealth Consent was obtain for visit to be over via telehealth. I verified that I am speaking with the correct person using two identifiers.  PCP:  Perlie Mayo, NP   Chief Complaint:  Wt and insomnia   History of Present Illness:    ISRAEL WUNDER is a 58 y.o. female with history of insomnia, hypertension, obesity among others.  Presents today for follow-up on weight and insomnia.  Reports that her weight has not changed much secondary to her diet not changing much.  She did have a couple weeks where she drank a lot of water and only ate one meal to help do a "detox" she reports that she started feeling much better, had more energy, slept better, had less joint discomfort especially in one of her fingers.  She reports that she got off of this because it was just for limited amount of time.  But reports that she wants to get back on something similar.  She reports that she sleeps much better with the [facility] but she does try to use it sparingly as  we had talked about going on holiday for her.  She reports that she takes melatonin if she wakes up in [place] that helps her get back to sleep and that seems to do the trick.  She continues to work on resetting her sleep schedule sometimes she sleeps better than others.  Overall she knows that she does not want to be on Lunesta long-term.  Would like to have at least 1 more month of it at this time.  Denies having any other signs or symptoms or any other concerns today.  The patient does not have symptoms concerning for COVID-19 infection (fever, chills, cough, or new shortness of breath).   Past Medical, Surgical, Social History, Allergies, and Medications have been Reviewed. Past Medical History:  Diagnosis Date  . Coronary artery disease   . Hypertension   . Renal disorder   . Sepsis ([place]) 2015   urinary   Past Surgical History:  Procedure Laterality Date  . CYSTOSCOPY W/ URETERAL STENT PLACEMENT Bilateral 02/04/2019   Procedure: CYSTOSCOPY WITH RETROGRADE PYELOGRAM/URETERAL STENT PLACEMENT;  Surgeon: Irine Seal, MD;  Location: [facility];  Service: Urology;  Laterality: Bilateral;  . CYSTOSCOPY/URETEROSCOPY/HOLMIUM LASER/STENT PLACEMENT Bilateral 02/16/2019   Procedure: CYSTOSCOPY BILATERAL URETEROSCOPY WITH HOLMIUM LASER AND STENTS PLACEMENT;  Surgeon: Irine Seal, MD;  Location: [facility];  Service: Urology;  Laterality: Bilateral;     Current Meds  Medication Sig  . aspirin EC 81 MG tablet Take 81 mg by mouth daily.  Marland Kitchen  Black Cohosh 200 MG CAPS Take 2 capsules by mouth daily.  Marland Kitchen docusate sodium (STOOL SOFTENER) 100 MG capsule Take 100 mg by mouth 2 (two) times daily.  . Eszopiclone 3 MG TABS Take 1 tablet (3 mg total) by mouth at bedtime as needed. Take immediately before bedtime  . fexofenadine (ALLEGRA) 180 MG tablet Take 180 mg by mouth daily.  Marland Kitchen lisinopril (ZESTRIL) 20 MG tablet Take 1 tablet (20 mg total) by mouth daily.  Marland Kitchen loratadine (CLARITIN) 10 MG tablet Take 10 mg by mouth  daily.  . Naproxen Sodium (ALEVE PO) Take 1 tablet by mouth as needed.  . nystatin (MYCOSTATIN/NYSTOP) powder Apply topically 3 (three) times daily. Apply to rash in groin till clear  . vitamin B-12 (CYANOCOBALAMIN) 250 MCG tablet Take 250 mcg by mouth daily.  . [DISCONTINUED] Eszopiclone 3 MG TABS Take 1 tablet (3 mg total) by mouth at bedtime as needed. Take immediately before bedtime     Allergies:   Ciprofloxacin   Social History   Tobacco Use  . Smoking status: Never Smoker  . Smokeless tobacco: Never Used  Substance Use Topics  . Alcohol use: Never    Frequency: Never  . Drug use: Never     Family Hx: The patient's family history includes Cancer in an other family member; Cirrhosis in her mother; Diabetes in her father; Hepatitis in her mother.  ROS:   Please see the history of present illness.    All other systems reviewed and are negative.   Labs/Other Tests and Data Reviewed:     Recent Labs: 12/08/2018: BUN 21; Creatinine, Ser 0.73; Hemoglobin 12.5; Platelets 398; Potassium 3.9; Sodium 138   Recent Lipid Panel No results found for: CHOL, TRIG, HDL, CHOLHDL, LDLCALC, LDLDIRECT  Wt Readings from Last 3 Encounters:  09/09/19 250 lb (113.4 kg)  06/10/19 251 lb 1.9 oz (113.9 kg)  05/18/19 250 lb (113.4 kg)     Objective:    Vital Signs:  Ht 5\' 2"  (1.575 m)   Wt 250 lb (113.4 kg)   LMP 02/15/2014 (Approximate) Comment: 5 years ago  BMI 45.73 kg/m    VS: reviewed GEN:  alert and oriented RESPIRATORY:  no shortness of breath durig conversation PSYCH:  good mood and normal affect, good communication  ASSESSMENT & PLAN:    1. Morbid obesity with BMI of 45.0-49.9, adult (HCC) Unchanged  ROXANA LAI is educated about the importance of exercise daily to help with weight management. A minumum of 30 minutes daily is recommended. Additionally, importance of healthy food choices  with portion control discussed. Encouraged to work on diet and exercise.   Wt Readings from Last 3 Encounters:  09/09/19 250 lb (113.4 kg)  06/10/19 251 lb 1.9 oz (113.9 kg)  05/18/19 250 lb (113.4 kg)     2. Insomnia, unspecified type Encouraged her to continue to work on sleep hygiene in  Hopes to wean her off Lunesta in the future.  1 month and will take holiday from this medication I advised this is not a medication to stay long term. Reviewed side effects, risks and benefits of medication. Patient acknowledged agreement and understanding of the plan.  - Eszopiclone 3 MG TABS; Take 1 tablet (3 mg total) by mouth at bedtime as needed. Take immediately before bedtime  Dispense: 30 tablet; Refill: 0  3. Encounter for screening for malignant neoplasm of cervix  - Ambulatory referral to Obstetrics / Gynecology   Time:   Today, I have spent 10  minutes with the patient with telehealth technology discussing the above problems.     Medication Adjustments/Labs and Tests Ordered: Current medicines are reviewed at length with the patient today.  Concerns regarding medicines are outlined above.   Tests Ordered: Orders Placed This Encounter  Procedures  . Ambulatory referral to Obstetrics / Gynecology    Medication Changes: Meds ordered this encounter  Medications  . Eszopiclone 3 MG TABS    Sig: Take 1 tablet (3 mg total) by mouth at bedtime as needed. Take immediately before bedtime    Dispense:  30 tablet    Refill:  0    Order Specific Question:   Supervising Provider    Answer:   SIMPSON, MARGARET E [[id number]]    Disposition:  Follow up March for annual no pap   Signed, Freddy FinnerHannah M Chonita Gadea, NP  09/09/2019 8:44 AM     [facility]

## 2019-10-21 ENCOUNTER — Other Ambulatory Visit: Payer: Self-pay

## 2019-10-21 DIAGNOSIS — I10 Essential (primary) hypertension: Secondary | ICD-10-CM

## 2019-10-21 MED ORDER — LISINOPRIL 20 MG PO TABS: 20.0000 mg | ORAL_TABLET | Freq: Every day | ORAL | 1 refills | Status: DC

## 2019-11-08 ENCOUNTER — Telehealth: Payer: Self-pay

## 2019-11-08 ENCOUNTER — Other Ambulatory Visit: Payer: Self-pay

## 2019-11-08 DIAGNOSIS — I10 Essential (primary) hypertension: Secondary | ICD-10-CM

## 2019-11-08 MED ORDER — LISINOPRIL 20 MG PO TABS: 20.0000 mg | ORAL_TABLET | Freq: Every day | ORAL | 0 refills | Status: DC

## 2019-11-08 NOTE — Telephone Encounter (Signed)
3 month supply sent

## 2019-11-08 NOTE — Telephone Encounter (Signed)
Please send in a 3 mth supply of the BP medicine Lisinoprol to the [facility]

## 2019-11-18 ENCOUNTER — Other Ambulatory Visit: Payer: Self-pay

## 2019-11-18 ENCOUNTER — Telehealth: Payer: Self-pay | Admitting: Urology

## 2019-11-18 DIAGNOSIS — N201 Calculus of ureter: Secondary | ICD-10-CM

## 2019-11-18 NOTE — Telephone Encounter (Signed)
Patient called and states on vm that she has been in pain over the last few days. She says has passed a few stones and feels like she needs advice on what to do from here.

## 2019-11-18 NOTE — Telephone Encounter (Signed)
Pt. notified, labs mailed and will go have KUB done in a.m. OV scheduled in April.

## 2019-11-18 NOTE — Telephone Encounter (Signed)
Pt. Called stating that she had severe rt. back pain (kidney) that started last Friday it subsided and became severe again the following Tuesday, she ended up passing 2 stones which she saved. She is going out of town next week and is trying to avoid an ER visit. Looking at her past visit from 04/16/2019 she was suppose to have hypercalciuria and 24 hr urine which pt. never got and f/u in 3 months which she never did.  Please advise.

## 2019-11-18 NOTE — Telephone Encounter (Signed)
If she has passed her stones and is asymptomatic she doesn't need to be seen urgently but if she could get a KUB today or tomorrow, that would be helpful and then we should have her get the hypercalciuria profile and 24 hr urine and f/u next available.

## 2019-11-19 ENCOUNTER — Ambulatory Visit (HOSPITAL_COMMUNITY)
Admission: RE | Admit: 2019-11-19 | Discharge: 2019-11-19 | Disposition: A | Discharge status: Home / Self Care | Payer: 59 | Source: Ambulatory Visit | Attending: Urology | Admitting: Urology

## 2019-11-19 ENCOUNTER — Other Ambulatory Visit: Payer: Self-pay

## 2019-11-19 DIAGNOSIS — N201 Calculus of ureter: Secondary | ICD-10-CM | POA: Insufficient documentation

## 2019-11-19 NOTE — Telephone Encounter (Signed)
Patient called back this morning and she passed another stone (3rd one) last night.  Her pain has resolved.  She is still going to get her xray today.  She will keep her follow up appointment.

## 2019-11-22 NOTE — Progress Notes (Signed)
She has stable LLP renal stones but nothing to suggest a ureteral stone.   She does appear to be constipated.

## 2019-11-23 ENCOUNTER — Telehealth: Payer: Self-pay

## 2019-11-23 NOTE — Telephone Encounter (Signed)
-----   Message from Ferdinand Lango, RN sent at 11/23/2019  1:42 PM EST ----- Left message to return call.  ----- Message ----- From: Bjorn Pippin, MD Sent: 11/22/2019   7:52 AM EST To: Ferdinand Lango, RN  She has stable LLP renal stones but nothing to suggest a ureteral stone.   She does appear to be constipated.

## 2019-11-23 NOTE — Telephone Encounter (Signed)
Pt notified of results

## 2019-12-16 ENCOUNTER — Encounter: Payer: PRIVATE HEALTH INSURANCE | Admitting: Family Medicine

## 2019-12-25 ENCOUNTER — Ambulatory Visit: Payer: 59 | Attending: Internal Medicine

## 2019-12-25 DIAGNOSIS — Z23 Encounter for immunization: Secondary | ICD-10-CM

## 2019-12-25 NOTE — Progress Notes (Signed)
   Covid-19 Vaccination Clinic  Name:  Sarah Davila    MRN: [id number] DOB: 1960-11-15  12/25/2019  Sarah Davila was observed post Covid-19 immunization for 15 minutes without incident. She was provided with Vaccine Information Sheet and instruction to access the V-Safe system.   Sarah Davila was instructed to call 911 with any severe reactions post vaccine: Marland Kitchen Difficulty breathing  . Swelling of face and throat  . A fast heartbeat  . A bad rash all over body  . Dizziness and weakness   Immunizations Administered    Name Date Dose VIS Date Route   Moderna COVID-19 Vaccine 12/25/2019 10:06 AM 0.5 mL 09/07/2019 Intramuscular   Manufacturer: Moderna   Lot: [id number]   NDC: 05259-102-89

## 2020-01-10 ENCOUNTER — Telehealth: Payer: Self-pay | Admitting: Urology

## 2020-01-10 NOTE — Telephone Encounter (Signed)
Pt asked for a nurse return call. 

## 2020-01-11 NOTE — Telephone Encounter (Signed)
Pt is wondering if she needs any imaging prior to her appt to see you end of this month. This is from urochart from July. She did not follow up as she should have. I placed labs in Epic and mailed 24 hour results. Labs still not completed either. Do you want any imaging?   ASSESSMENT:    ICD-10 Details  1 GU:  Renal calculus - N20.0 She has small stable lower pole stones.   2 NON-GU:  Hypercalciuria - R82.994 I have reviewed dietary recommendations and gave her litholink pamphlets for stone prevention. I will have her return in 3 months with repeat labs and 24hr urine. I would be reluctant to start a thiazide at this time with the elevated uric acid. will reassess at f/u.   3  Hyperuricemia - E79.0 Uric acid was 7.9. I discussed reducing animal protein. Recheck in 3 months.    PLAN:   Schedule  Labs: 3 Months - Hypercalciura Profile   3 Months - 24 Hour Urine  Lab Notes: stone risk analysis  Return Visit/Planned Activity: 3 Months - F/U Pending Results, Office Visit

## 2020-01-12 ENCOUNTER — Other Ambulatory Visit: Payer: Self-pay

## 2020-01-12 DIAGNOSIS — N201 Calculus of ureter: Secondary | ICD-10-CM

## 2020-01-12 NOTE — Telephone Encounter (Signed)
Pt informed of KUB order and labs printed and mailed to pt as well. Pt voiced understanding of having labs done prior to upcoming office visit.

## 2020-01-12 NOTE — Telephone Encounter (Signed)
Lets get a KUB.

## 2020-01-24 ENCOUNTER — Telehealth: Payer: Self-pay | Admitting: Urology

## 2020-01-24 NOTE — Telephone Encounter (Signed)
She was primarily returning to review those studies so it would be best to reschedule.

## 2020-01-24 NOTE — Telephone Encounter (Signed)
Patient called and left a message that she is out of town and forgot to bring the 24 hour urine kit.  She is asking if this will affect her Friday appt with [place] or not?

## 2020-01-24 NOTE — Telephone Encounter (Signed)
Okay. I will reschedule patient, thank you!

## 2020-01-26 ENCOUNTER — Ambulatory Visit: Payer: 59 | Attending: Internal Medicine

## 2020-01-26 DIAGNOSIS — Z23 Encounter for immunization: Secondary | ICD-10-CM

## 2020-01-26 NOTE — Progress Notes (Signed)
   Covid-19 Vaccination Clinic  Name:  HAYDAN WEDIG    MRN: [id number] DOB: 02/09/1961  01/26/2020  Ms. Lubrano was observed post Covid-19 immunization for 15 minutes without incident. She was provided with Vaccine Information Sheet and instruction to access the V-Safe system.   Ms. Mainwaring was instructed to call 911 with any severe reactions post vaccine: Marland Kitchen Difficulty breathing  . Swelling of face and throat  . A fast heartbeat  . A bad rash all over body  . Dizziness and weakness   Immunizations Administered    Name Date Dose VIS Date Route   Moderna COVID-19 Vaccine 01/26/2020  3:13 PM 0.5 mL 09/2019 Intramuscular   Manufacturer: Moderna   Lot: [id number]   NDC: 02111-735-67

## 2020-01-28 ENCOUNTER — Ambulatory Visit: Payer: PRIVATE HEALTH INSURANCE | Admitting: Urology

## 2020-02-04 ENCOUNTER — Ambulatory Visit: Payer: 59 | Admitting: Urology

## 2020-02-23 ENCOUNTER — Encounter: Payer: 59 | Admitting: Adult Health

## 2020-03-24 ENCOUNTER — Encounter: Payer: 59 | Admitting: Adult Health

## 2020-03-30 ENCOUNTER — Telehealth: Payer: Self-pay

## 2020-03-30 ENCOUNTER — Other Ambulatory Visit: Payer: Self-pay | Admitting: *Deleted

## 2020-03-30 DIAGNOSIS — I10 Essential (primary) hypertension: Secondary | ICD-10-CM

## 2020-03-30 MED ORDER — LISINOPRIL 20 MG PO TABS: 20.0000 mg | ORAL_TABLET | Freq: Every day | ORAL | 0 refills | Status: DC

## 2020-03-30 NOTE — Telephone Encounter (Signed)
Pt medication has been sent to the pharmacy

## 2020-03-30 NOTE — Telephone Encounter (Signed)
lisinopril (ZESTRIL) 20 MG tablet please send in 3 mth supply --[facility] to [facility]

## 2020-03-31 ENCOUNTER — Ambulatory Visit: Payer: 59 | Admitting: Urology

## 2020-04-03 ENCOUNTER — Encounter: Payer: 59 | Admitting: Adult Health

## 2020-04-04 IMAGING — CT CT RENAL STONE PROTOCOL
2 of 4 series · 16 of 46 positions shown, 18 images · non-contrast
Comparison: Plain film 12/23/2018. CT 12/08/2018

CLINICAL DATA: Bilateral flank pain, worse on the right. Hematuria.
Nausea. Fever starting 5 days ago.

EXAM:
CT ABDOMEN AND PELVIS WITHOUT CONTRAST
TECHNIQUE: Multidetector CT imaging of the abdomen and pelvis was performed
following the standard protocol without IV contrast.

[Series 2: axial st · axial · 0.81mm/px · z∈[+1048,+1433]mm · 13 of 89 slices shown, 15 images]
[im 6/89  soft-tissue]
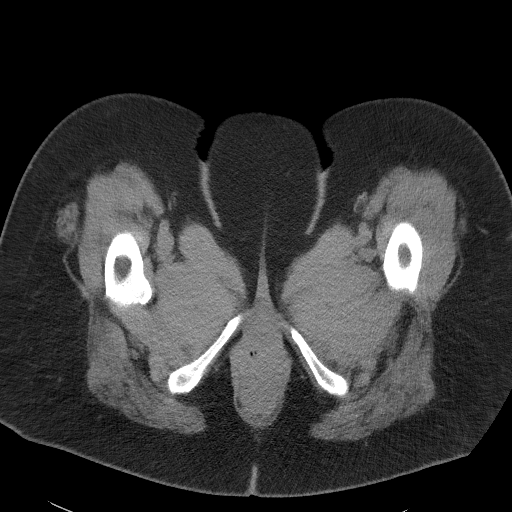
[im 6/89  bone]
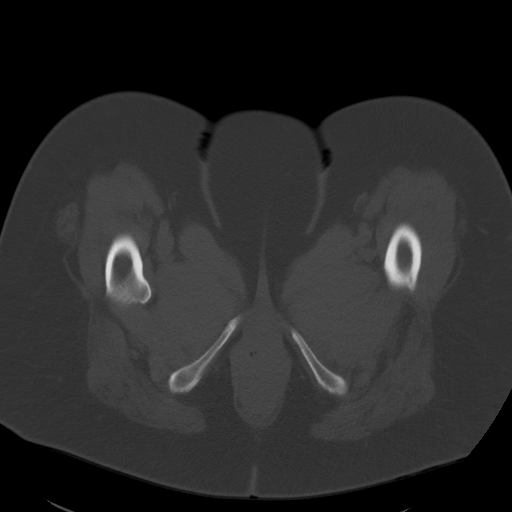
[im 12/89  soft-tissue]
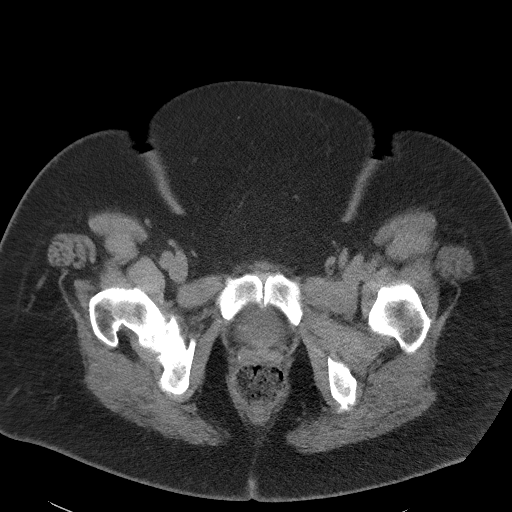
[im 17/89  soft-tissue]
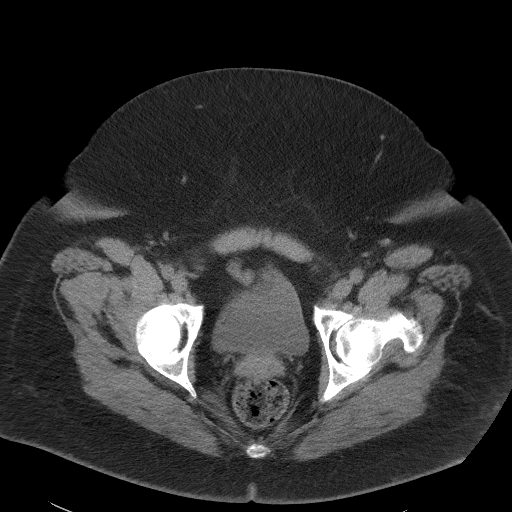
[im 28/89  soft-tissue]
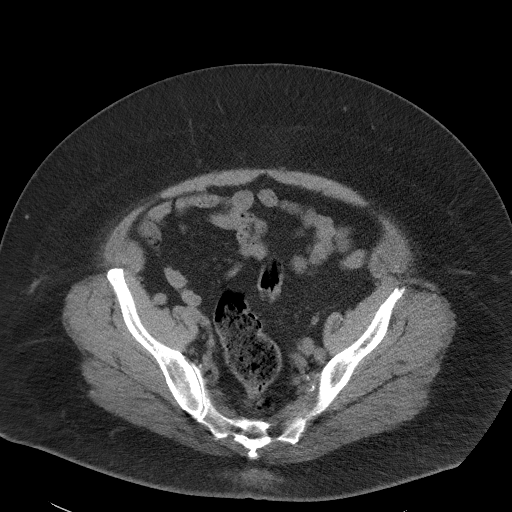
[im 34/89  soft-tissue]
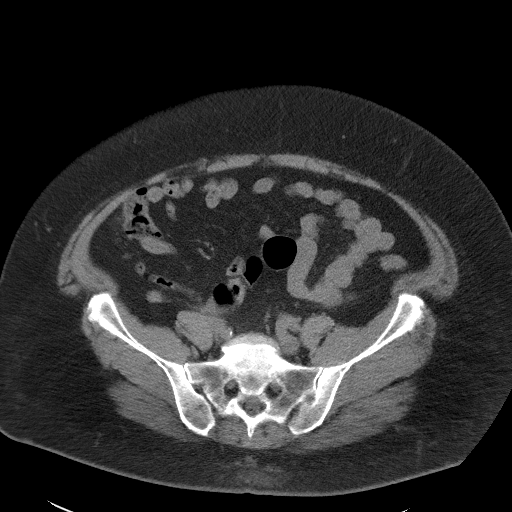
[im 39/89  soft-tissue]
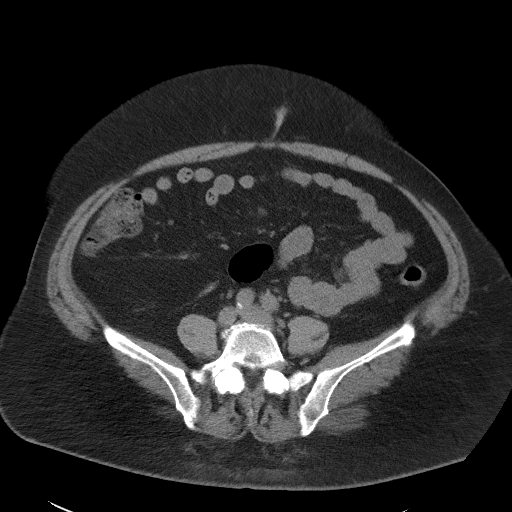
[im 45/89  soft-tissue]
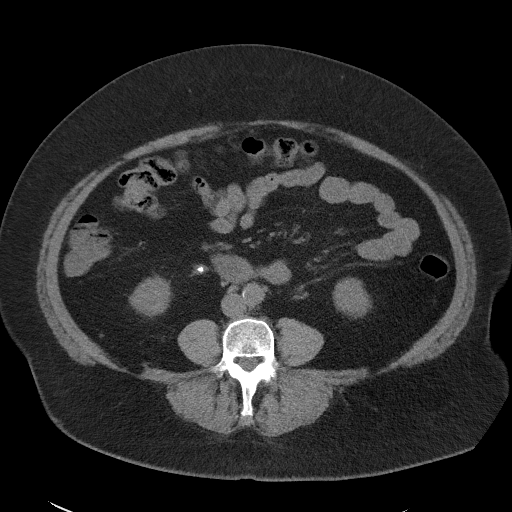
[im 50/89  soft-tissue]
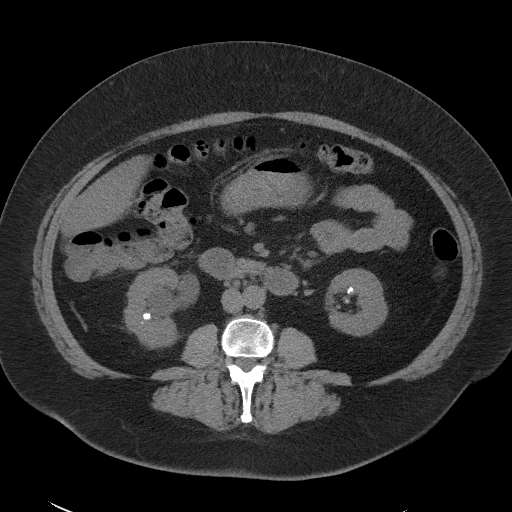
[im 56/89  soft-tissue]
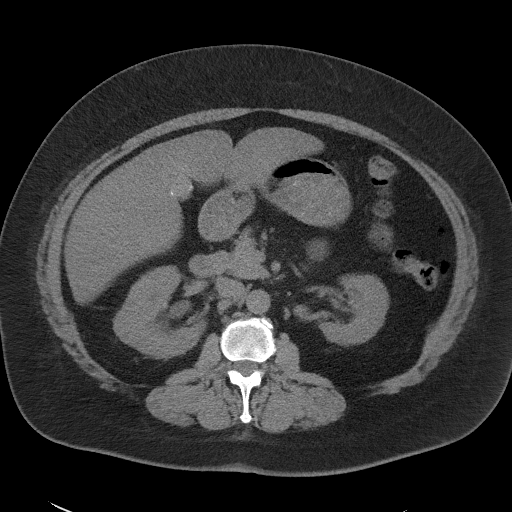
[im 56/89  bone]
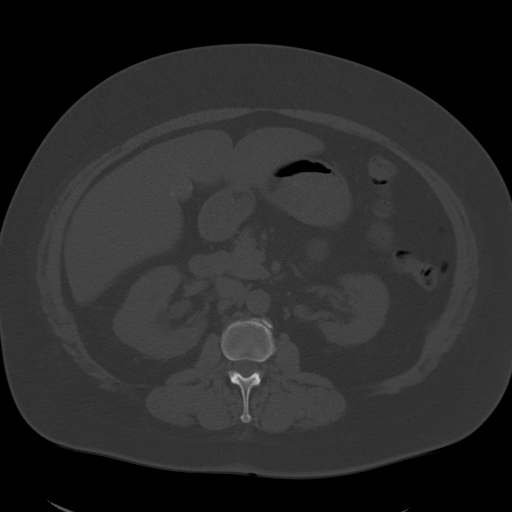
[im 61/89  soft-tissue]
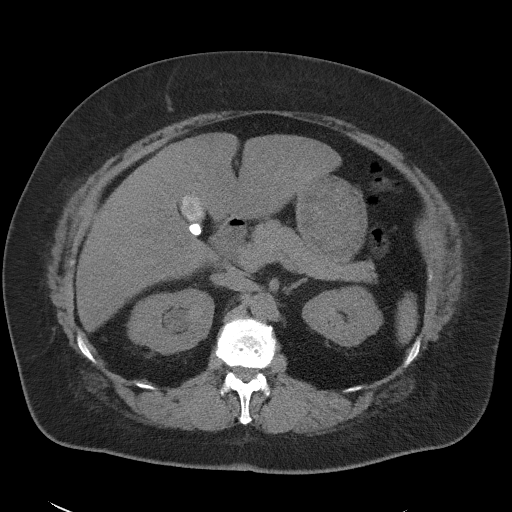
[im 72/89  soft-tissue]
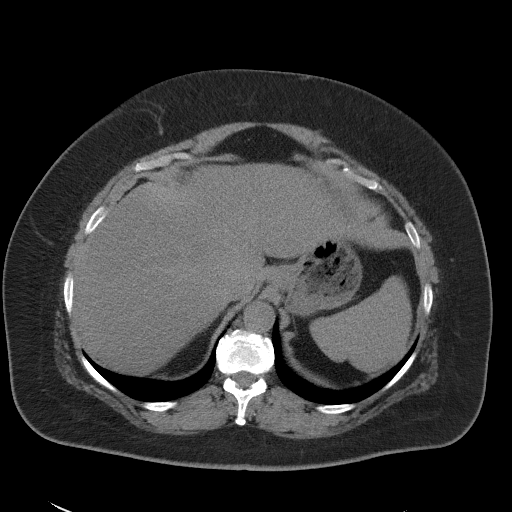
[im 78/89  soft-tissue]
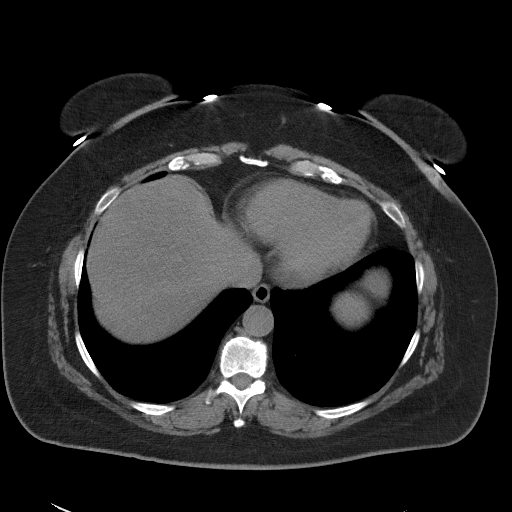
[im 83/89  soft-tissue]
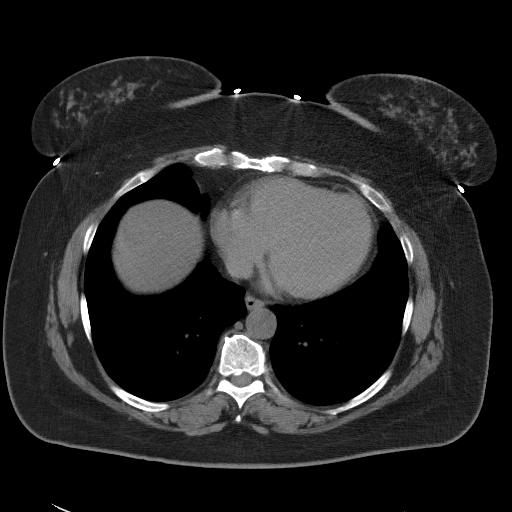

[Series 5: coronal st · coronal · 0.79mm/px · 3 of 101 slices shown]
[im 34/101  soft-tissue]
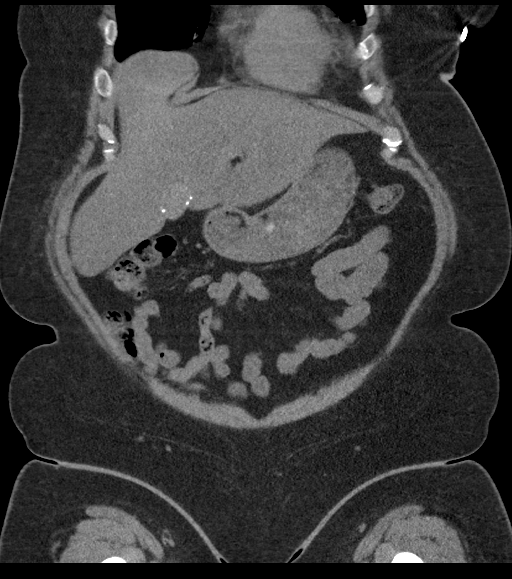
[im 45/101  soft-tissue]
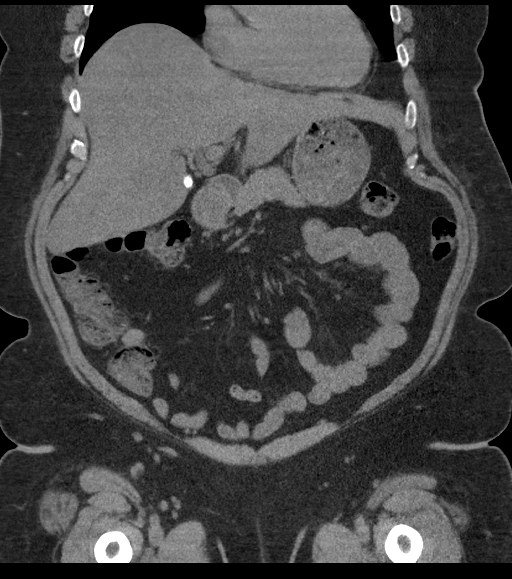
[im 56/101  soft-tissue]
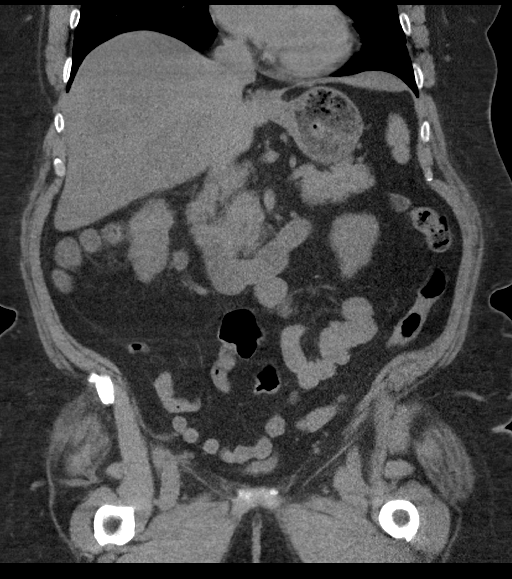

[16 of 46 positions shown; findings below may reference images not displayed]

FINDINGS: Lower chest: Calcified 4 mm left lower lobe granuloma. Normal heart
size without pericardial or pleural effusion.

Hepatobiliary: Moderate hepatic steatosis and hepatomegaly at
cm craniocaudal. Gallstones including at 2.1 cm. No acute
cholecystitis or biliary duct dilatation.

Pancreas: Normal, without mass or ductal dilatation.

Spleen: Normal in size, without focal abnormality.

Adrenals/Urinary Tract: Normal adrenal glands. Left greater than
right renal collecting system calculi.

A dominant stone within the left ureteropelvic junction measures
6x12 mm on images 38/2 and 63/5. This is new in position.

A proximal right ureteric 5x6 mm stone on images 45/2 and 56/5 is in
the region of a 3 mm stone on the prior. Mild right-sided
hydronephrosis. No left-sided obstruction. No distal ureteric or
bladder calculi.

Stomach/Bowel: Normal stomach, without wall thickening. Colonic
stool burden suggests constipation. Normal terminal ileum and
appendix. Normal small bowel.

Vascular/Lymphatic: Aortic and branch vessel atherosclerosis. No
abdominopelvic adenopathy.

Reproductive: Intrauterine device.  No adnexal mass.

Other: No significant free fluid.

Musculoskeletal: No acute osseous abnormality. Degenerate disc
disease at the lumbosacral junction. Disc bulge at this level.
IMPRESSION: 1. Bilateral nephrolithiasis, with a dominant stone at the left
ureteropelvic junction and a proximal right ureteric stone. Mild
right-sided hydronephrosis.
2. Hepatic steatosis and hepatomegaly.
3. Cholelithiasis.
4. Possible constipation.
5.  Aortic Atherosclerosis ([id number]).

## 2020-04-06 IMAGING — RF RETROGRADE PYELOGRAM
1 series · 8 of 8 positions shown · non-contrast
Comparison: CT of the abdomen and pelvis on 02/02/2019

CLINICAL DATA: Right ureteral and left pelvic/UPJ calculi.

EXAM:
INTRAOPERATIVE BILATERAL RETROGRADE UROGRAPHY
TECHNIQUE: Images were obtained with the C-arm fluoroscopic device
intraoperatively and submitted for interpretation post-operatively.
Please see the procedural report for the amount of contrast and the
fluoroscopy time utilized.

[Series 1: run · 2 acquisitions, 8 frames shown]
[im 1/2]
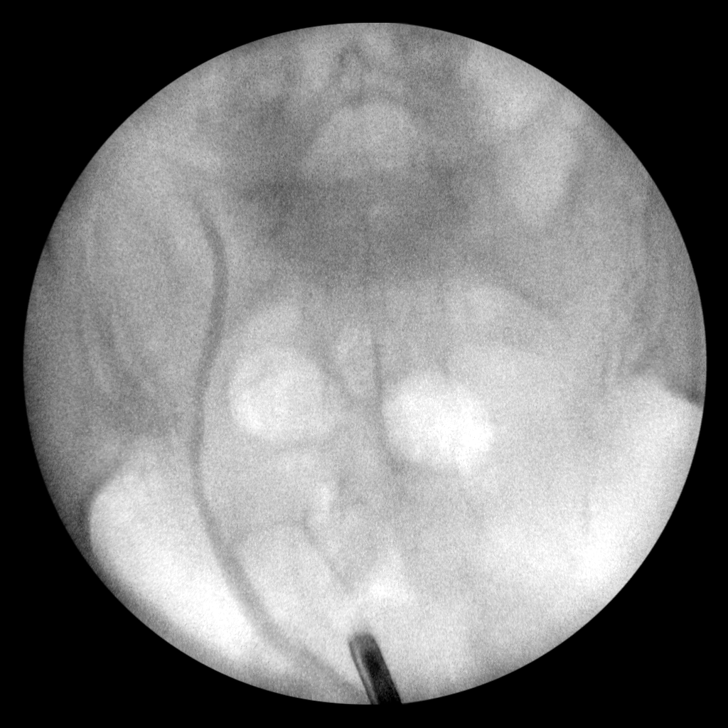
[im 1/2]
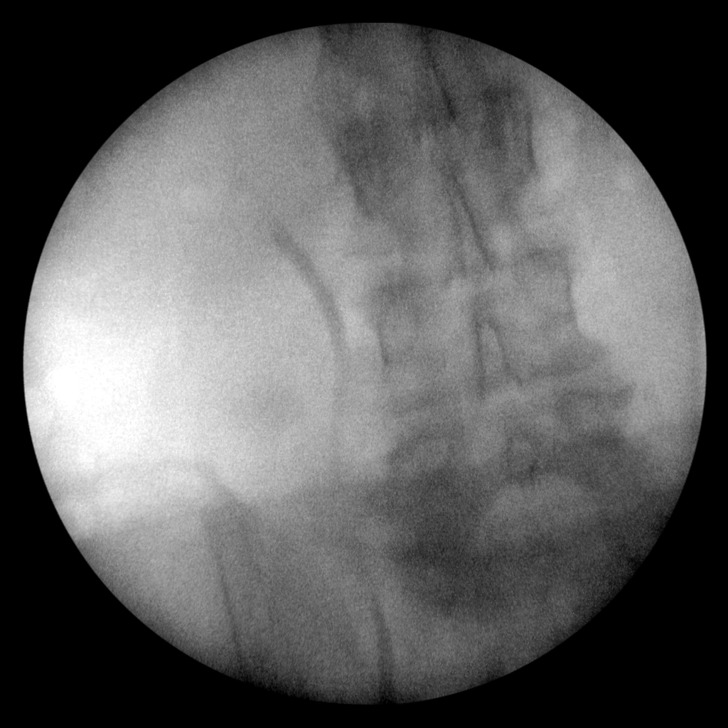
[im 1/2]
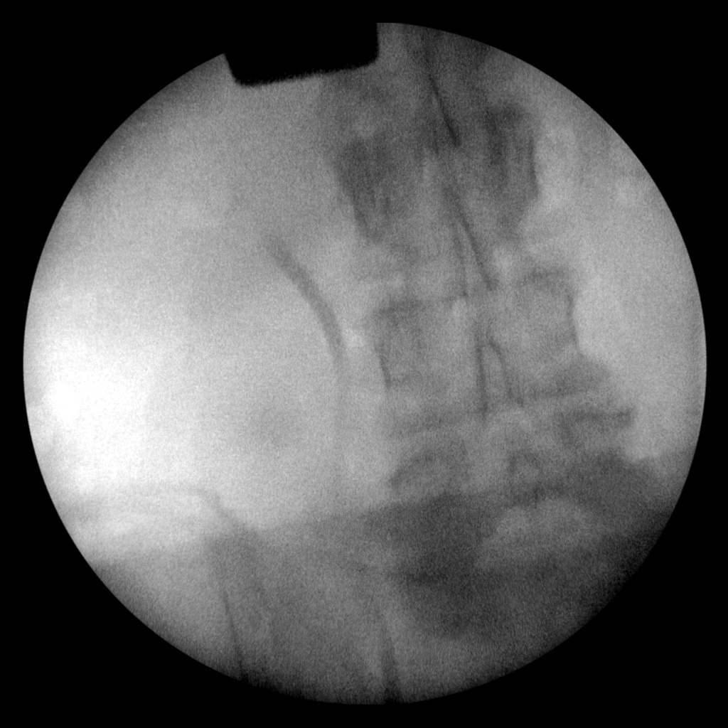
[im 1/2]
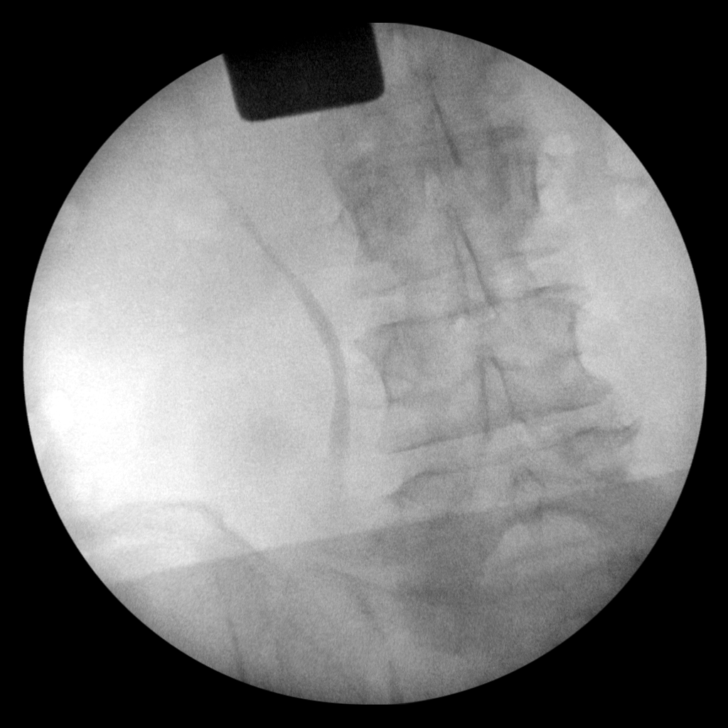
[im 2/2]
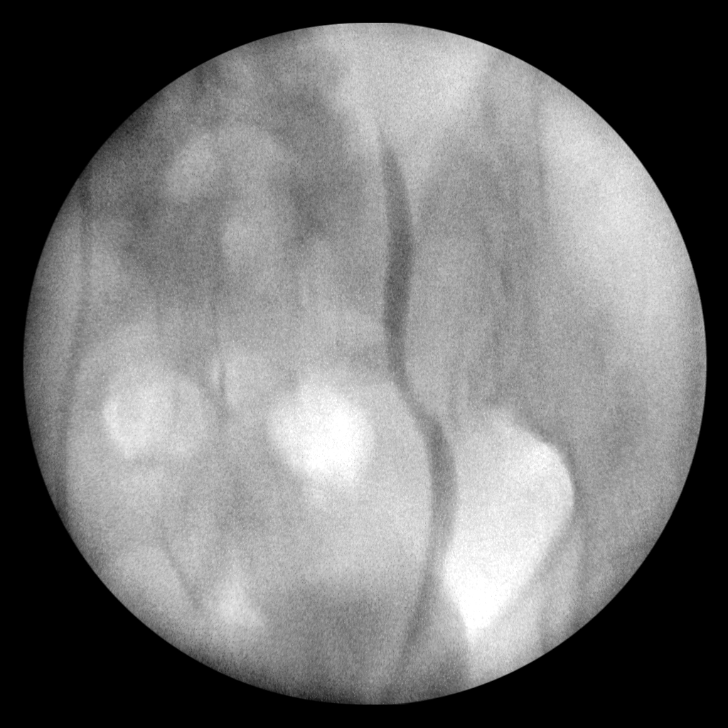
[im 2/2]
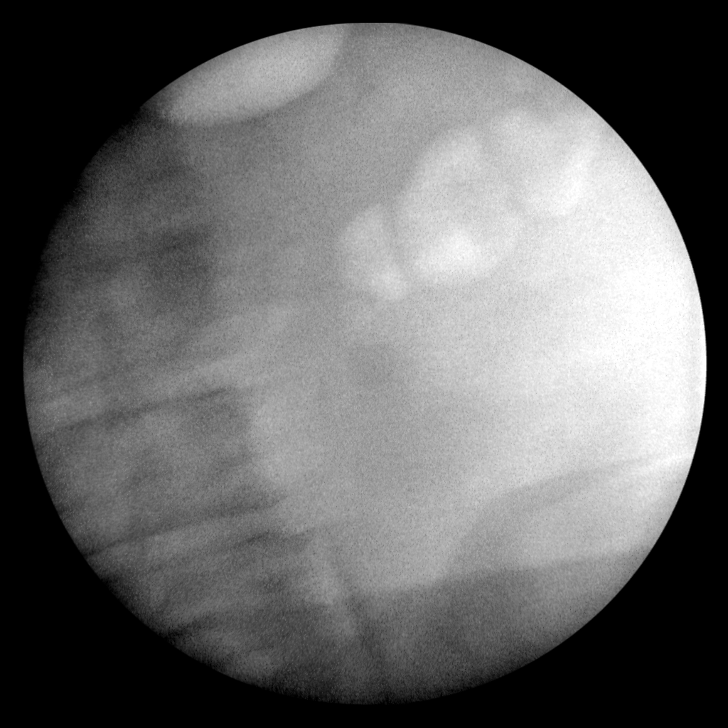
[im 2/2]
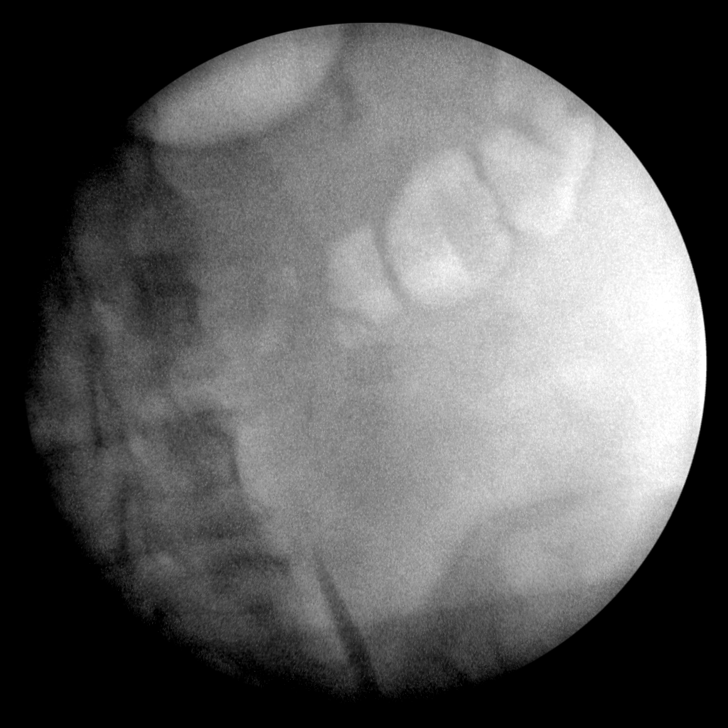
[im 2/2]
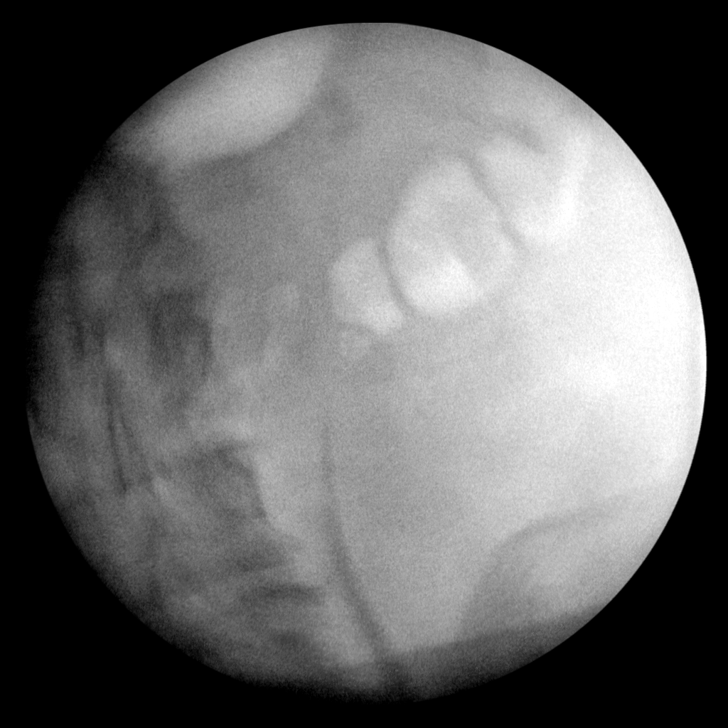

[8 of 8 positions shown; findings below may reference images not displayed]

FINDINGS: Intraoperative imaging with a C-arm demonstrates cannulation of the
right ureter via a cystoscope with contrast injection demonstrating
a normal caliber ureter up to the level of the abdomen. The proximal
right ureteral calculus is not definitely visualized. A right-sided
ureteral stent was placed which appears appropriately position.

After cannulation of the left ureteral orifice, contrast injection
demonstrates a normal caliber ureter. A left-sided ureteral stent
was placed extending up to the renal collecting system.
IMPRESSION: Bilateral ureteral stent placement with appropriate positioning of
stents.

## 2020-05-12 ENCOUNTER — Ambulatory Visit: Payer: 59 | Admitting: Urology

## 2020-05-16 IMAGING — US US RENAL
1 series · 14 of 25 positions shown · non-contrast
Comparison: CT abdomen and pelvis 02/02/2019

CLINICAL DATA: Renal calculus, history hypertension, coronary
artery disease

EXAM:
RENAL / URINARY TRACT ULTRASOUND COMPLETE

[Series 1: us renal · 0.24mm/px · 14 of 95 slices shown]
[im 1/95]
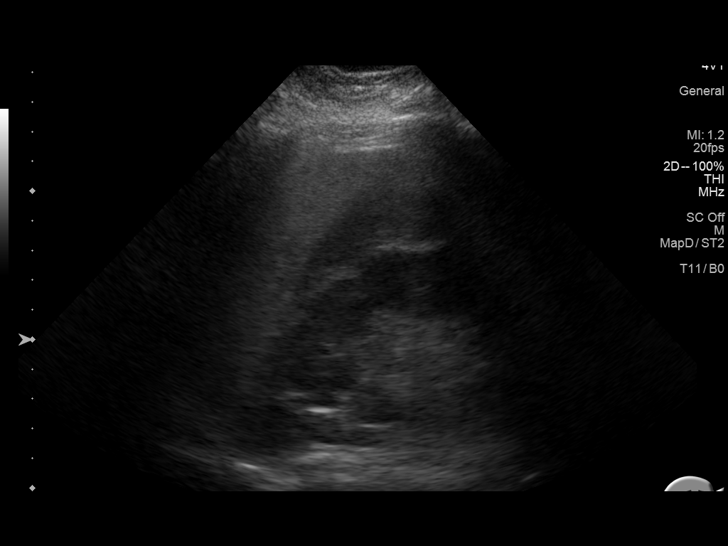
[im 8/95]
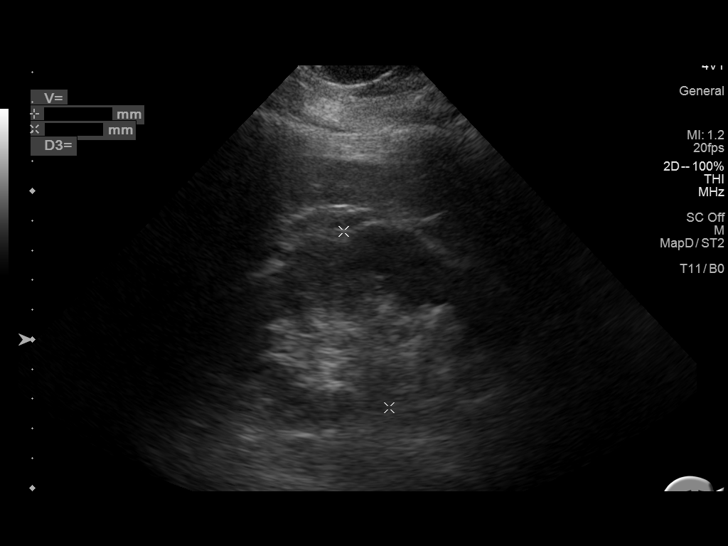
[im 16/95]
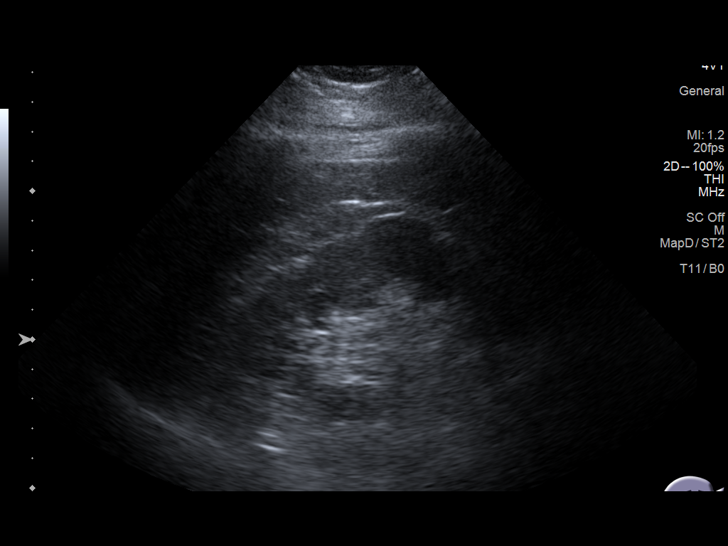
[im 24/95]
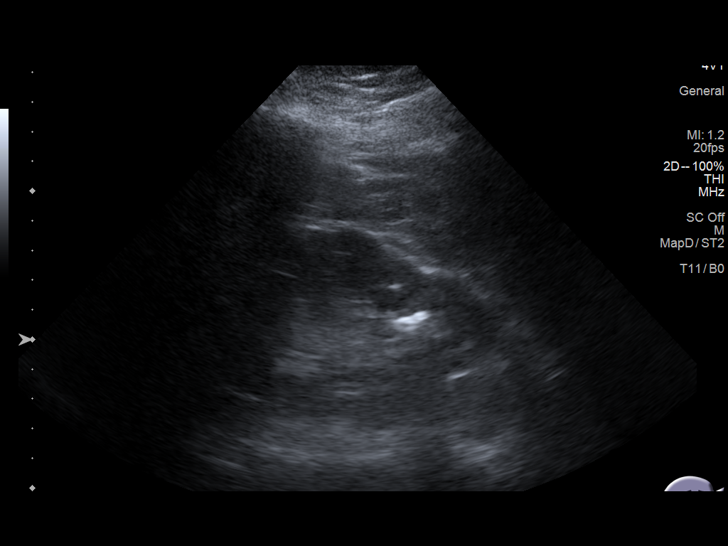
[im 32/95]
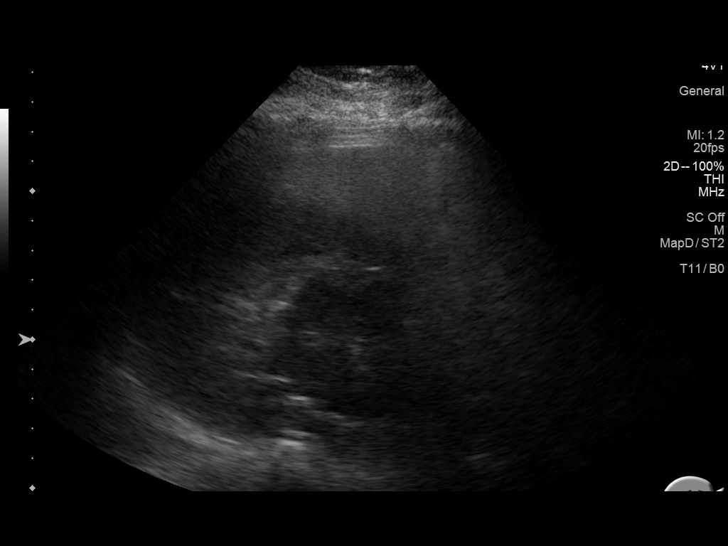
[im 36/95]
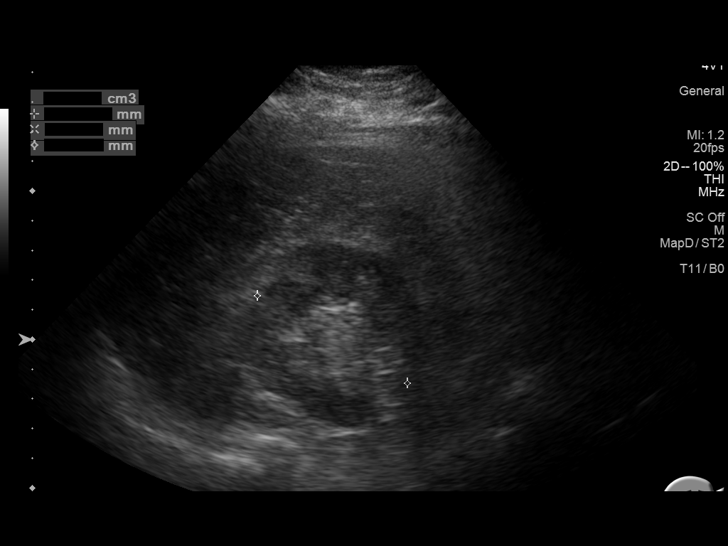
[im 44/95]
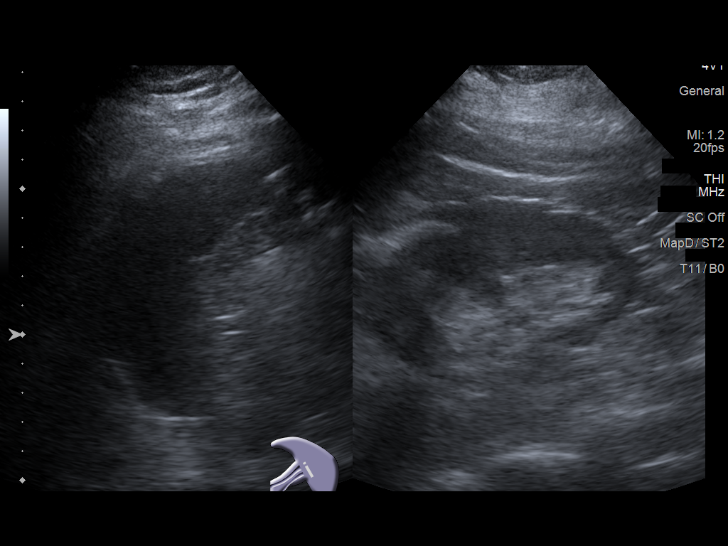
[im 51/95]
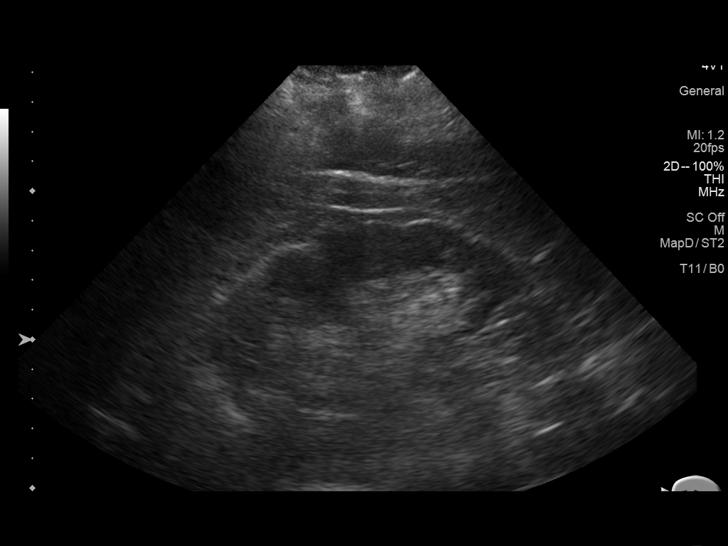
[im 59/95]
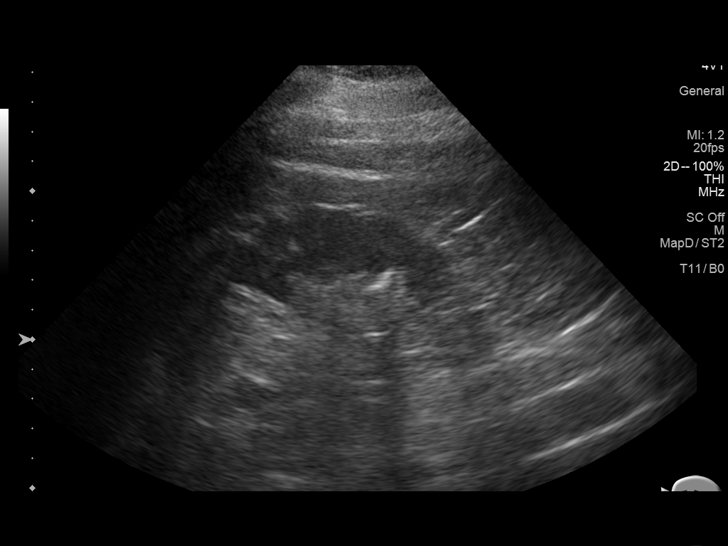
[im 63/95]
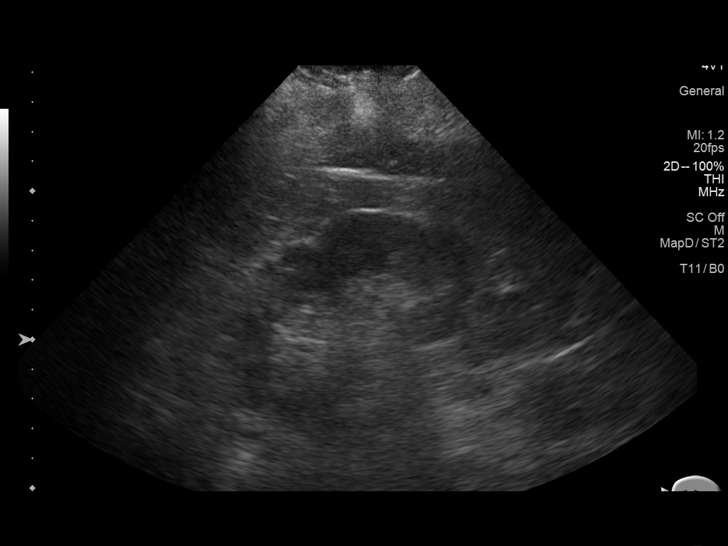
[im 71/95]
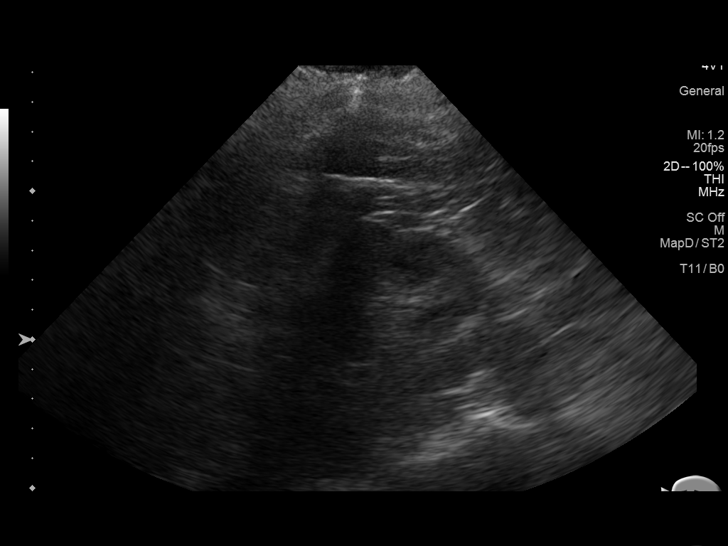
[im 79/95]
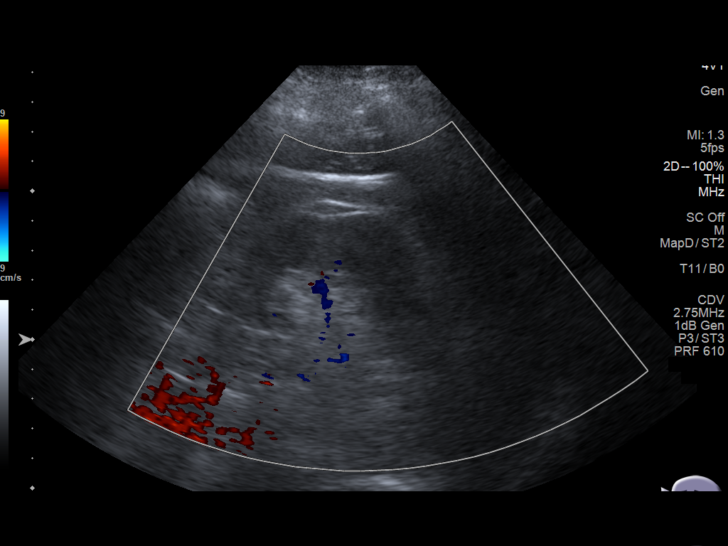
[im 87/95]
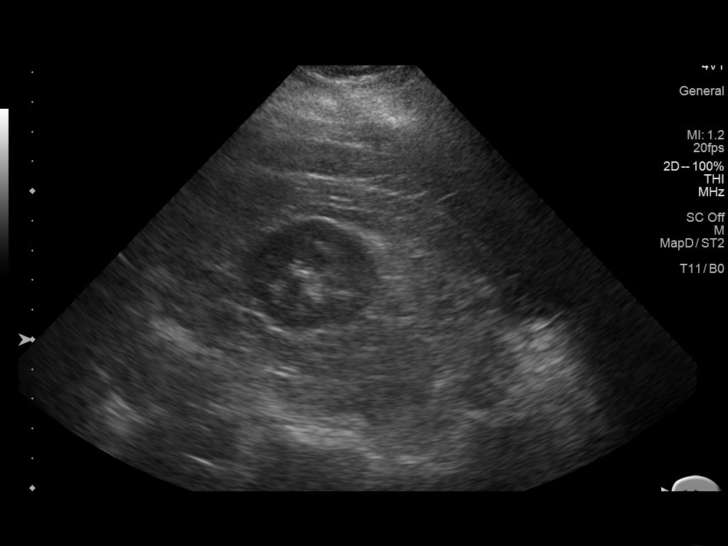
[im 95/95]
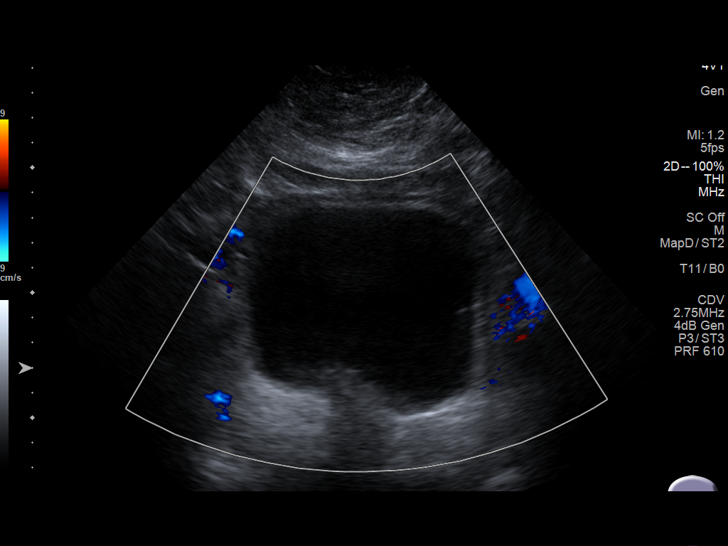

[14 of 25 positions shown; findings below may reference images not displayed]

FINDINGS: Right Kidney:

Renal measurements: 10.2 x 6.1 x 5.9 cm = volume: 192 mL. Normal
cortical thickness. Upper normal cortical echogenicity. No mass or
hydronephrosis. 10 mm nonshadowing brightly echogenic focus at
inferior pole corresponds to a calculus seen on prior CT.

Left Kidney:

Renal measurements: 12.2 x 5.6 x 5.6 cm = volume: 204 mL. Normal
cortical thickness. Upper normal cortical echogenicity. No mass or
hydronephrosis. Echogenic focus at inferior pole 8 mm diameter,
corresponds to calculus on prior CT.

Bladder:

Appears normal for degree of bladder distention.
IMPRESSION: Nonobstructing calculi at the inferior poles of both kidneys.

Otherwise normal exam.

## 2020-05-26 ENCOUNTER — Ambulatory Visit (INDEPENDENT_AMBULATORY_CARE_PROVIDER_SITE_OTHER): Payer: 59 | Admitting: Family Medicine

## 2020-05-26 ENCOUNTER — Encounter: Payer: Self-pay | Admitting: Family Medicine

## 2020-05-26 ENCOUNTER — Other Ambulatory Visit: Payer: Self-pay

## 2020-05-26 ENCOUNTER — Ambulatory Visit: Payer: 59 | Admitting: Urology

## 2020-05-26 VITALS — BP 137/82 | HR 88 | Resp 16 | Ht 62.0 in | Wt 250.1 lb

## 2020-05-26 DIAGNOSIS — H65192 Other acute nonsuppurative otitis media, left ear: Secondary | ICD-10-CM | POA: Diagnosis not present

## 2020-05-26 DIAGNOSIS — H6690 Otitis media, unspecified, unspecified ear: Secondary | ICD-10-CM | POA: Insufficient documentation

## 2020-05-26 MED ORDER — KETOROLAC TROMETHAMINE 60 MG/2ML IM SOLN
30.0000 mg | Freq: Once | INTRAMUSCULAR | Status: AC
  Administered 2020-05-26: 30 mg via INTRAMUSCULAR

## 2020-05-26 MED ORDER — KETOROLAC TROMETHAMINE 60 MG/2ML IM SOLN: 60.0000 mg | Freq: Once | INTRAMUSCULAR | Status: DC

## 2020-05-26 MED ORDER — AMOXICILLIN-POT CLAVULANATE 875-125 MG PO TABS: 1.0000 | ORAL_TABLET | Freq: Two times a day (BID) | ORAL | 0 refills | Status: AC

## 2020-05-26 NOTE — Patient Instructions (Addendum)
I appreciate the opportunity to provide you with care for your health and wellness. Today we discussed: ear pain   Follow up: 3 months for annual visit- fasting morning so can get labs  No labs or referrals today  Please take all the antibiotic to prevent reinfection Do not put anything into the ear. Use a cotton ball when showering or at night  Please continue to practice social distancing to keep you, your family, and our community safe.  If you must go out, please wear a mask and practice good handwashing.  It was a pleasure to see you and I look forward to continuing to work together on your health and well-being. Please do not hesitate to call the office if you need care or have questions about your care.  Have a wonderful day and week. With Gratitude, Tereasa Coop, DNP, AGNP-BC

## 2020-05-26 NOTE — Addendum Note (Signed)
Addended by: Freddy Finner on: 05/26/2020 09:53 AM   Modules accepted: Orders

## 2020-05-26 NOTE — Addendum Note (Signed)
Addended by: Recardo Evangelist A on: 05/26/2020 10:02 AM   Modules accepted: Orders

## 2020-05-26 NOTE — Assessment & Plan Note (Signed)
Acute otitis media noted on physical exam today.  Augmentin provided.  Follow-up as needed instructed on taking the medication and focus of an action occurs.  Encouraged to reach out if no improvement post treatment.

## 2020-05-26 NOTE — Progress Notes (Signed)
Subjective:  Patient ID: Sarah Davila, female    DOB: 12/19/1960  Age: 58 y.o. MRN: [id number]  CC:  Chief Complaint  Patient presents with  . Ear Pain    left ear. started yesterday, thought she "heard liquid" in her ear on Monday. pain radiates down into jaw. 8/10      HPI  HPI Sarah Davila is a 59 year old female patient of mine.  She comes in today after having left ear pain become more severe in the last 24 hours.  She reports that Monday she had a gurgling/liquid likes sensation in her ear.  She took some allergy medication it went away she never had any more issues until Thursday evening when she started having some stabbing pain as if somebody was sticking a pencil into her eardrum.  Pain radiates down into her jaw.  Pain is an 8 out of 10 today in the office.  Has had Covid vaccinations.  Denies having any other signs or symptoms of infection.  Today patient denies signs and symptoms of COVID 19 infection including fever, chills, cough, shortness of breath, and headache. Past Medical, Surgical, Social History, Allergies, and Medications have been Reviewed.   Past Medical History:  Diagnosis Date  . Coronary artery disease   . Hypertension   . Renal disorder   . Sepsis (HCC) 2015   urinary    Current Meds  Medication Sig  . aspirin EC 81 MG tablet Take 81 mg by mouth daily.  . Black Cohosh 200 MG CAPS Take 2 capsules by mouth daily.  Marland Kitchen docusate sodium (STOOL SOFTENER) 100 MG capsule Take 100 mg by mouth 2 (two) times daily.  . Eszopiclone 3 MG TABS Take 1 tablet (3 mg total) by mouth at bedtime as needed. Take immediately before bedtime  . fexofenadine (ALLEGRA) 180 MG tablet Take 180 mg by mouth daily.  Marland Kitchen lisinopril (ZESTRIL) 20 MG tablet Take 1 tablet (20 mg total) by mouth daily.  Marland Kitchen loratadine (CLARITIN) 10 MG tablet Take 10 mg by mouth daily.  . Naproxen Sodium (ALEVE PO) Take 1 tablet by mouth as needed.  . nystatin (MYCOSTATIN/NYSTOP) powder Apply  topically 3 (three) times daily. Apply to rash in groin till clear  . vitamin B-12 (CYANOCOBALAMIN) 250 MCG tablet Take 250 mcg by mouth daily.    ROS:  Review of Systems  Constitutional: Negative.   HENT: Positive for ear pain.   Eyes: Negative.   Respiratory: Negative.   Cardiovascular: Negative.   Gastrointestinal: Negative.   Genitourinary: Negative.   Musculoskeletal: Negative.   Skin: Negative.   Neurological: Negative.   Endo/Heme/Allergies: Negative.   Psychiatric/Behavioral: Negative.   All other systems reviewed and are negative.    Objective:   Today's Vitals: BP 137/82   Pulse 88   Resp 16   Ht 5\' 2"  (1.575 m)   Wt 250 lb 1.9 oz (113.5 kg)   LMP 02/15/2014 (Approximate) Comment: 5 years ago  SpO2 96%   BMI 45.75 kg/m  Vitals with BMI 05/26/2020 09/09/2019 06/10/2019  Height 5\' 2"  5\' 2"  5\' 2"   Weight 250 lbs 2 oz 250 lbs 251 lbs 2 oz  BMI 45.74 45.71 45.92  Systolic 137 - 109  Diastolic 82 - 69  Pulse 88 - 94     Physical Exam Vitals reviewed.  Constitutional:      General: She is not in acute distress.    Appearance: Normal appearance. She is well-developed and well-groomed. She is not  ill-appearing or toxic-appearing.  HENT:     Head: Normocephalic and atraumatic.     Right Ear: Hearing, tympanic membrane, ear canal and external ear normal.     Left Ear: Hearing normal. Tenderness present.  No middle ear effusion. Tympanic membrane is injected and erythematous.     Mouth/Throat:     Comments: Mask in place  Eyes:     General: Lids are normal.        Right eye: Discharge present.        Left eye: Discharge present.    Conjunctiva/sclera:     Right eye: Right conjunctiva is injected.     Left eye: Left conjunctiva is injected.  Cardiovascular:     Rate and Rhythm: Normal rate and regular rhythm.     Pulses: Normal pulses.     Heart sounds: Normal heart sounds.  Pulmonary:     Effort: Pulmonary effort is normal.     Breath sounds: Normal breath  sounds.  Musculoskeletal:     Cervical back: Normal range of motion and neck supple.  Lymphadenopathy:     Cervical: No cervical adenopathy.  Neurological:     General: No focal deficit present.     Mental Status: She is alert and oriented to person, place, and time.  Psychiatric:        Mood and Affect: Mood normal.        Behavior: Behavior normal. Behavior is cooperative.        Thought Content: Thought content normal.        Judgment: Judgment normal.     Assessment   1. Other non-recurrent acute nonsuppurative otitis media of left ear     Tests ordered No orders of the defined types were placed in this encounter.    Plan: Please see assessment and plan per problem list above.   Meds ordered this encounter  Medications  . amoxicillin-clavulanate (AUGMENTIN) 875-125 MG tablet    Sig: Take 1 tablet by mouth 2 (two) times daily for 5 days.    Dispense:  10 tablet    Refill:  0    Order Specific Question:   Supervising Provider    Answer:   Kerri Perches [[id number]]    Patient to follow-up in 3 months for annual  Sarah Finner, NP

## 2020-05-31 ENCOUNTER — Telehealth: Payer: Self-pay

## 2020-05-31 ENCOUNTER — Ambulatory Visit: Payer: 59 | Admitting: Family Medicine

## 2020-05-31 NOTE — Telephone Encounter (Signed)
Pt had an ear infection was seen in the office on 05/26/20, pt stated that she believes ear infection is due to an abscess tooth and the dentist office is out of the office until 06/07/20, pt is wondering if she can have a a refill on the antibiotics that were prescribed on 05/26/20, pt stated that the  Ear feels better but she worries due to the possible abscess that the pain will return.

## 2020-05-31 NOTE — Telephone Encounter (Signed)
I didn't see her for the tooth ache/abscess so I cannot extend the antibiotic like that. I am happy the ear ache/infection is better. I recommend UC for follow up on this.

## 2020-05-31 NOTE — Telephone Encounter (Signed)
Pt stated the ear is better and she doesn't understand why another antibiotic cant be written. She has to go out of town this weekend and doesn't want this to get bad also her dentist is out of office until 06-07-20. Advised we cant do teeth that's why the urgent care was recommended. Please advise

## 2020-05-31 NOTE — Telephone Encounter (Signed)
Pt advised of recommendation. She stated she has talked to the dentist and they are going to take care of her with an antibiotic.

## 2020-05-31 NOTE — Telephone Encounter (Signed)
Im truly sorry she is going out of town and feeling bad.  But best practice is not to prescribe antibiotics in this fashion. The UC can verify her abscess and treat with meds if they think it is needed. She would need an appt to get this. Please let her know we are sorry for the inconvenience.

## 2020-07-06 ENCOUNTER — Ambulatory Visit (INDEPENDENT_AMBULATORY_CARE_PROVIDER_SITE_OTHER): Payer: 59 | Admitting: Family Medicine

## 2020-07-06 ENCOUNTER — Other Ambulatory Visit: Payer: Self-pay

## 2020-07-06 ENCOUNTER — Encounter: Payer: Self-pay | Admitting: Family Medicine

## 2020-07-06 VITALS — BP 138/86 | HR 82 | Resp 20 | Ht 62.0 in | Wt 247.0 lb

## 2020-07-06 DIAGNOSIS — H7320 Unspecified myringitis, unspecified ear: Secondary | ICD-10-CM | POA: Diagnosis not present

## 2020-07-06 DIAGNOSIS — K30 Functional dyspepsia: Secondary | ICD-10-CM | POA: Diagnosis not present

## 2020-07-06 DIAGNOSIS — Z23 Encounter for immunization: Secondary | ICD-10-CM | POA: Diagnosis not present

## 2020-07-06 NOTE — Progress Notes (Signed)
Subjective:  Patient ID: Sarah Davila, female    DOB: 1961-03-23  Age: 59 y.o. MRN: [id number]  CC:  Chief Complaint  Patient presents with  . Abdominal Pain    RUQ, epigastric area x 2 wks also would like L ear evaluated      HPI  HPI   Ms Callegari is a 59 year old female patient of mine.  History as detailed below Reports today that over the last 2 weeks she has had an increase in discomfort up underneath her breast and rib cage area right above the bellybutton.  She reports it hurts when she sitting and standing.  Especially when she overeats.  She reports no falls recently.  She does report that she did change her diet pretty drastically to a keto diet to try to help with weight loss.  She does wear underwire bras but no recent bra changes.  She reports it is a sudden uncomfortable discomfort.  No true pain except for one morning when she had a lot of discomfort.  She denies having any nausea, vomiting, diarrhea, constipation.  She denies having any heartburn or belching.  But reports that she does notice that after eating.  Has been eating a lot more fried foods and meats previously was more vegetarian.  She denies having any chest pain related to this.  Additionally she wanted to have her ear looked that she denies having any ear pain.  But reports that she still has abscessed tooth and is not able to get to the dentist until mid October.  Today patient denies signs and symptoms of COVID 19 infection including fever, chills, cough, shortness of breath, and headache. Past Medical, Surgical, Social History, Allergies, and Medications have been Reviewed.   Past Medical History:  Diagnosis Date  . Coronary artery disease   . Hypertension   . Renal disorder   . Sepsis (HCC) 2015   urinary    Current Meds  Medication Sig  . aspirin EC 81 MG tablet Take 81 mg by mouth daily.  . Black Cohosh 200 MG CAPS Take 2 capsules by mouth daily.  Marland Kitchen docusate sodium (STOOL  SOFTENER) 100 MG capsule Take 100 mg by mouth 2 (two) times daily.  . Eszopiclone 3 MG TABS Take 1 tablet (3 mg total) by mouth at bedtime as needed. Take immediately before bedtime  . fexofenadine (ALLEGRA) 180 MG tablet Take 180 mg by mouth daily.  Marland Kitchen lisinopril (ZESTRIL) 20 MG tablet Take 1 tablet (20 mg total) by mouth daily.  Marland Kitchen loratadine (CLARITIN) 10 MG tablet Take 10 mg by mouth daily.  . Naproxen Sodium (ALEVE PO) Take 1 tablet by mouth as needed.  . nystatin (MYCOSTATIN/NYSTOP) powder Apply topically 3 (three) times daily. Apply to rash in groin till clear  . vitamin B-12 (CYANOCOBALAMIN) 250 MCG tablet Take 250 mcg by mouth daily.    ROS:  Review of Systems  Constitutional: Negative.   HENT: Negative.   Eyes: Negative.   Respiratory: Negative.   Cardiovascular: Negative.   Gastrointestinal: Positive for abdominal pain.  Genitourinary: Negative.   Musculoskeletal: Negative.   Skin: Negative.   Neurological: Negative.   Endo/Heme/Allergies: Negative.   Psychiatric/Behavioral: Negative.      Objective:   Today's Vitals: BP 138/86   Pulse 82   Resp 20   Ht 5\' 2"  (1.575 m)   Wt 247 lb (112 kg)   LMP 02/15/2014 (Approximate) Comment: 5 years ago  BMI 45.18 kg/m  Vitals with  BMI 07/06/2020 05/26/2020 09/09/2019  Height 5\' 2"  5\' 2"  5\' 2"   Weight 247 lbs 250 lbs 2 oz 250 lbs  BMI 45.17 45.74 45.71  Systolic 138 137 -  Diastolic 86 82 -  Pulse 82 88 -     Physical Exam Vitals and nursing note reviewed.  Constitutional:      Appearance: Normal appearance. She is well-developed and well-groomed. She is obese.  HENT:     Head: Normocephalic and atraumatic.     Right Ear: External ear normal.     Left Ear: External ear normal. Tympanic membrane is injected.     Nose: Nose normal.     Mouth/Throat:     Mouth: Mucous membranes are moist.     Pharynx: Oropharynx is clear.  Eyes:     General:        Right eye: No discharge.        Left eye: No discharge.      Conjunctiva/sclera: Conjunctivae normal.  Cardiovascular:     Rate and Rhythm: Normal rate and regular rhythm.     Pulses: Normal pulses.     Heart sounds: Normal heart sounds.  Pulmonary:     Effort: Pulmonary effort is normal.     Breath sounds: Normal breath sounds.  Abdominal:     General: Bowel sounds are normal.     Palpations: Abdomen is soft.     Tenderness: There is no abdominal tenderness. There is no right CVA tenderness or left CVA tenderness.     Hernia: No hernia is present.  Musculoskeletal:        General: Normal range of motion.     Cervical back: Normal range of motion and neck supple.  Skin:    General: Skin is warm.  Neurological:     General: No focal deficit present.     Mental Status: She is alert and oriented to person, place, and time.  Psychiatric:        Attention and Perception: Attention normal.        Mood and Affect: Mood normal.        Speech: Speech normal.        Behavior: Behavior normal. Behavior is cooperative.        Thought Content: Thought content normal.        Cognition and Memory: Cognition normal.        Judgment: Judgment normal.      Assessment   1. Indigestion   2. Tympanic membrane inflammation   3. Need for immunization against influenza   4. Immunization due     Tests ordered Orders Placed This Encounter  Procedures  . Flu Vaccine QUAD 36+ mos IM  . Tdap vaccine greater than or equal to 7yo IM     Plan: Please see assessment and plan per problem list above.   No orders of the defined types were placed in this encounter.   Patient to follow-up in 4 weeks by phone  , NP

## 2020-07-06 NOTE — Assessment & Plan Note (Signed)

## 2020-07-06 NOTE — Assessment & Plan Note (Signed)
Questionable changes in diet might of caused indigestion will start Pepcid for the next 2 to 4 weeks in addition to avoidance of keto diet to see if her symptoms improve.

## 2020-07-06 NOTE — Assessment & Plan Note (Signed)
Patient was educated on the recommendation for Tdap vaccine. After obtaining informed consent, the vaccine was administered no adverse effects noted at time of administration. Patient provided with education on arm soreness and use of tylenol or ibuprofen (if safe) for this. Encourage to use the arm vaccine was given in to help reduce the soreness. Patient educated on the signs of a reaction to the vaccine and advised to contact the office should these occur.

## 2020-07-06 NOTE — Patient Instructions (Signed)
I appreciate the opportunity to provide you with care for your health and wellness. Today we discussed:  Questionable indigestion related to diet change  Follow up: 4 weeks-by phone for stomach   No labs or referrals today  AVOID Keto diet   Start Pepcid daily for the next 2-4 weeks  Please continue to practice social distancing to keep you, your family, and our community safe.  If you must go out, please wear a mask and practice good handwashing.  It was a pleasure to see you and I look forward to continuing to work together on your health and well-being. Please do not hesitate to call the office if you need care or have questions about your care.  Have a wonderful day and week. With Gratitude, Tereasa Coop, DNP, AGNP-BC

## 2020-07-06 NOTE — Assessment & Plan Note (Signed)
She is encouraged to continue taking her allergy medication.  States she has no pain at this time.  I do not think this is in association of otitis media.  She does have an abscessed tooth on that side as well.  Advised for her to call back if she develops pain in the ear.  If she has pain into that she is advised to call her dentist.  She agrees to this care plan

## 2020-07-11 ENCOUNTER — Encounter: Payer: Self-pay | Admitting: Family Medicine

## 2020-07-11 ENCOUNTER — Other Ambulatory Visit: Payer: Self-pay

## 2020-07-11 ENCOUNTER — Ambulatory Visit (INDEPENDENT_AMBULATORY_CARE_PROVIDER_SITE_OTHER): Payer: 59 | Admitting: Family Medicine

## 2020-07-11 VITALS — BP 148/96 | HR 81 | Resp 16 | Ht 62.0 in | Wt 246.0 lb

## 2020-07-11 DIAGNOSIS — M25562 Pain in left knee: Secondary | ICD-10-CM | POA: Diagnosis not present

## 2020-07-11 DIAGNOSIS — G8929 Other chronic pain: Secondary | ICD-10-CM | POA: Insufficient documentation

## 2020-07-11 MED ORDER — MELOXICAM 7.5 MG PO TABS: 7.5000 mg | ORAL_TABLET | Freq: Every day | ORAL | 0 refills | Status: DC

## 2020-07-11 MED ORDER — KETOROLAC TROMETHAMINE 60 MG/2ML IM SOLN
60.0000 mg | Freq: Once | INTRAMUSCULAR | Status: AC
  Administered 2020-07-11: 60 mg via INTRAMUSCULAR

## 2020-07-11 MED ORDER — METHYLPREDNISOLONE ACETATE 80 MG/ML IJ SUSP
80.0000 mg | Freq: Once | INTRAMUSCULAR | Status: AC
  Administered 2020-07-11: 80 mg via INTRAMUSCULAR

## 2020-07-11 NOTE — Assessment & Plan Note (Signed)
Injections in office today for pain- Mobic daily until can be see by Ortho. Unsure if OA vs Weight  She is working on weight loss now.  Reviewed side effects, risks and benefits of medication.   Patient acknowledged agreement and understanding of the plan.

## 2020-07-11 NOTE — Patient Instructions (Signed)
I appreciate the opportunity to provide you with care for your health and wellness. Today we discussed: Left knee pain  Follow up: 08/08/2020 or as needed  No labs  Referrals today- Ortho  Injections today to help with pain. Rest the knee, elevate and ice when not working 20 mins at least post work or activity.  Please continue to practice social distancing to keep you, your family, and our community safe.  If you must go out, please wear a mask and practice good handwashing.  It was a pleasure to see you and I look forward to continuing to work together on your health and well-being. Please do not hesitate to call the office if you need care or have questions about your care.  Have a wonderful day and week. With Gratitude, Tereasa Coop, DNP, AGNP-BC

## 2020-07-11 NOTE — Progress Notes (Signed)
Subjective:  Patient ID: Sarah Davila, female    DOB: 1960/12/14  Age: 59 y.o. MRN: [id number]  CC:  Chief Complaint  Patient presents with  . Knee Pain    left knee      HPI  HPI Sarah Davila is a 59 year old female patient of mine.  She presents today for an acute visit secondary to having ongoing left knee pain.  She was just recently seen however she forgot to mention that she was having the knee discomfort at that time.  She reports that she has been working cleaning houses and noted discomfort with her knee.  She reports has been off and on for the last year.  Is just starting to get worse here recently.  Characteristically described as tightness behind the knee, painful at the top of the kneecap.  Is aggravated by walking, sitting for long periods of time and then getting up.  Nothing really relieves the discomfort has been taking 800 mg of ibuprofen without success.  Constant discomfort today is a 10 out of 10.  She is willing to get injections and referral to Ortho.   Today patient denies signs and symptoms of COVID 19 infection including fever, chills, cough, shortness of breath, and headache. Past Medical, Surgical, Social History, Allergies, and Medications have been Reviewed.   Past Medical History:  Diagnosis Date  . Coronary artery disease   . Hypertension   . Renal disorder   . Sepsis (HCC) 2015   urinary    Current Meds  Medication Sig  . aspirin EC 81 MG tablet Take 81 mg by mouth daily.  . Black Cohosh 200 MG CAPS Take 2 capsules by mouth daily.  Marland Kitchen docusate sodium (STOOL SOFTENER) 100 MG capsule Take 100 mg by mouth 2 (two) times daily.  . Eszopiclone 3 MG TABS Take 1 tablet (3 mg total) by mouth at bedtime as needed. Take immediately before bedtime  . fexofenadine (ALLEGRA) 180 MG tablet Take 180 mg by mouth daily.  Marland Kitchen lisinopril (ZESTRIL) 20 MG tablet Take 1 tablet (20 mg total) by mouth daily.  Marland Kitchen loratadine (CLARITIN) 10 MG tablet Take 10 mg by  mouth daily.  . Naproxen Sodium (ALEVE PO) Take 1 tablet by mouth as needed.  . nystatin (MYCOSTATIN/NYSTOP) powder Apply topically 3 (three) times daily. Apply to rash in groin till clear  . vitamin B-12 (CYANOCOBALAMIN) 250 MCG tablet Take 250 mcg by mouth daily.    ROS:  Review of Systems  Constitutional: Negative.   HENT: Negative.   Eyes: Negative.   Respiratory: Negative.   Cardiovascular: Negative.   Gastrointestinal: Negative.   Genitourinary: Negative.   Musculoskeletal: Positive for joint pain.  Skin: Negative.   Neurological: Negative.   Endo/Heme/Allergies: Negative.   Psychiatric/Behavioral: Negative.      Objective:   Today's Vitals: BP (!) 148/96   Pulse 81   Resp 16   Ht 5\' 2"  (1.575 m)   Wt 246 lb (111.6 kg)   LMP 02/15/2014 (Approximate) Comment: 5 years ago  SpO2 96%   BMI 44.99 kg/m  Vitals with BMI 07/11/2020 07/06/2020 05/26/2020  Height 5\' 2"  5\' 2"  5\' 2"   Weight 246 lbs 247 lbs 250 lbs 2 oz  BMI 44.98 45.17 45.74  Systolic 148 138 05/28/2020  Diastolic 96 86 82  Pulse 81 82 88     Physical Exam Vitals and nursing note reviewed.  Constitutional:      Appearance: Normal appearance. She is obese.  HENT:     Head: Normocephalic and atraumatic.     Right Ear: External ear normal.     Left Ear: External ear normal.  Eyes:     General:        Right eye: No discharge.        Left eye: No discharge.     Conjunctiva/sclera: Conjunctivae normal.  Cardiovascular:     Rate and Rhythm: Normal rate and regular rhythm.     Pulses: Normal pulses.     Heart sounds: Normal heart sounds.  Pulmonary:     Effort: Pulmonary effort is normal.     Breath sounds: Normal breath sounds.  Musculoskeletal:     Cervical back: Normal range of motion and neck supple.     Left knee: No deformity, ecchymosis, bony tenderness or crepitus. Normal range of motion. Tenderness present.  Skin:    General: Skin is warm.  Neurological:     General: No focal deficit present.      Mental Status: She is alert and oriented to person, place, and time.  Psychiatric:        Mood and Affect: Mood normal.        Behavior: Behavior normal.        Thought Content: Thought content normal.        Judgment: Judgment normal.      Assessment   1. Acute pain of left knee     Tests ordered Orders Placed This Encounter  Procedures  . Ambulatory referral to Orthopedic Surgery     Plan: Please see assessment and plan per problem list above.   Meds ordered this encounter  Medications  . meloxicam (MOBIC) 7.5 MG tablet    Sig: Take 1 tablet (7.5 mg total) by mouth daily.    Dispense:  20 tablet    Refill:  0    Order Specific Question:   Supervising Provider    Answer:   Kerri Perches [[id number]]    Patient to follow-up in as scheduled  Freddy Finner, NP

## 2020-07-14 ENCOUNTER — Ambulatory Visit: Payer: 59 | Admitting: Urology

## 2020-07-18 ENCOUNTER — Other Ambulatory Visit: Payer: Self-pay

## 2020-07-18 ENCOUNTER — Ambulatory Visit (INDEPENDENT_AMBULATORY_CARE_PROVIDER_SITE_OTHER): Payer: 59 | Admitting: Orthopaedic Surgery

## 2020-07-18 ENCOUNTER — Ambulatory Visit: Payer: 59

## 2020-07-18 ENCOUNTER — Encounter: Payer: Self-pay | Admitting: Orthopaedic Surgery

## 2020-07-18 VITALS — BP 146/78 | HR 60 | Ht 62.0 in | Wt 242.0 lb

## 2020-07-18 DIAGNOSIS — M25562 Pain in left knee: Secondary | ICD-10-CM | POA: Diagnosis not present

## 2020-07-18 DIAGNOSIS — G8929 Other chronic pain: Secondary | ICD-10-CM | POA: Diagnosis not present

## 2020-07-18 MED ORDER — MELOXICAM 7.5 MG PO TABS: ORAL_TABLET | ORAL | 5 refills | Status: DC

## 2020-07-18 NOTE — Progress Notes (Signed)
Subjective:    Patient ID: Sarah Davila, female    DOB: [birth year], 59 y.o.   MRN: [id number]  HPI She has pain of the left knee.  It has been present for over five to six months.  She has swelling and popping but no giving way.  She has tried Advil.  Recently she saw [facility] and began Mobic.  She is a little better.  She is tried of hurting.  I have reviewed her notes.  She has no trauma, no redness, no numbness.   Review of Systems  Constitutional: Positive for activity change.  Musculoskeletal: Positive for arthralgias, gait problem and joint swelling.  All other systems reviewed and are negative.  For Review of Systems, all other systems reviewed and are negative.  The following is a summary of the past history medically, past history surgically, known current medicines, social history and family history.  This information is gathered electronically by the computer from prior information and documentation.  I review this each visit and have found including this information at this point in the chart is beneficial and informative.   Past Medical History:  Diagnosis Date  . Coronary artery disease   . Hypertension   . Renal disorder   . Sepsis (HCC) 2015   urinary    Past Surgical History:  Procedure Laterality Date  . CYSTOSCOPY W/ URETERAL STENT PLACEMENT Bilateral 02/04/2019   Procedure: CYSTOSCOPY WITH RETROGRADE PYELOGRAM/URETERAL STENT PLACEMENT;  Surgeon: Bjorn Pippin, MD;  Location: [facility];  Service: Urology;  Laterality: Bilateral;  . CYSTOSCOPY/URETEROSCOPY/HOLMIUM LASER/STENT PLACEMENT Bilateral 02/16/2019   Procedure: CYSTOSCOPY BILATERAL URETEROSCOPY WITH HOLMIUM LASER AND STENTS PLACEMENT;  Surgeon: Bjorn Pippin, MD;  Location: [facility];  Service: Urology;  Laterality: Bilateral;    Current Outpatient Medications on File Prior to Visit  Medication Sig Dispense Refill  . aspirin EC 81 MG tablet Take 81 mg by mouth daily.    . Black Cohosh 200 MG  CAPS Take 2 capsules by mouth daily.    Marland Kitchen docusate sodium (STOOL SOFTENER) 100 MG capsule Take 100 mg by mouth 2 (two) times daily.    . Eszopiclone 3 MG TABS Take 1 tablet (3 mg total) by mouth at bedtime as needed. Take immediately before bedtime 30 tablet 0  . fexofenadine (ALLEGRA) 180 MG tablet Take 180 mg by mouth daily.    Marland Kitchen lisinopril (ZESTRIL) 20 MG tablet Take 1 tablet (20 mg total) by mouth daily. 90 tablet 0  . loratadine (CLARITIN) 10 MG tablet Take 10 mg by mouth daily.    Marland Kitchen nystatin (MYCOSTATIN/NYSTOP) powder Apply topically 3 (three) times daily. Apply to rash in groin till clear 30 g 1  . vitamin B-12 (CYANOCOBALAMIN) 250 MCG tablet Take 250 mcg by mouth daily.     No current facility-administered medications on file prior to visit.    Social History   Socioeconomic History  . Marital status: Divorced    Spouse name: Not on file  . Number of children: Not on file  . Years of education: Not on file  . Highest education level: Not on file  Occupational History  . Not on file  Tobacco Use  . Smoking status: Never Smoker  . Smokeless tobacco: Never Used  Vaping Use  . Vaping Use: Never used  Substance and Sexual Activity  . Alcohol use: Never  . Drug use: Never  . Sexual activity: Yes    Birth control/protection: I.U.D.  Other Topics Concern  .  Not on file  Social History Narrative  . Not on file   Social Determinants of Health   Financial Resource Strain:   . Difficulty of Paying Living Expenses: Not on file  Food Insecurity:   . Worried About Programme researcher, broadcasting/film/video in the Last Year: Not on file  . Ran Out of Food in the Last Year: Not on file  Transportation Needs:   . Lack of Transportation (Medical): Not on file  . Lack of Transportation (Non-Medical): Not on file  Physical Activity:   . Days of Exercise per Week: Not on file  . Minutes of Exercise per Session: Not on file  Stress:   . Feeling of Stress : Not on file  Social Connections:   .  Frequency of Communication with Friends and Family: Not on file  . Frequency of Social Gatherings with Friends and Family: Not on file  . Attends Religious Services: Not on file  . Active Member of Clubs or Organizations: Not on file  . Attends Banker Meetings: Not on file  . Marital Status: Not on file  Intimate Partner Violence:   . Fear of Current or Ex-Partner: Not on file  . Emotionally Abused: Not on file  . Physically Abused: Not on file  . Sexually Abused: Not on file    Family History  Problem Relation Age of Onset  . Hepatitis Mother   . Cirrhosis Mother   . Diabetes Father   . Cancer Other     BP (!) 146/78   Pulse 60   Ht 5\' 2"  (1.575 m)   Wt 242 lb (109.8 kg)   LMP 02/15/2014 (Approximate) Comment: 5 years ago  BMI 44.26 kg/m   Body mass index is 44.26 kg/m.      Objective:   Physical Exam Vitals and nursing note reviewed. Exam conducted with a chaperone present.  Constitutional:      Appearance: She is well-developed.  HENT:     Head: Normocephalic and atraumatic.  Eyes:     Conjunctiva/sclera: Conjunctivae normal.     Pupils: Pupils are equal, round, and reactive to light.  Cardiovascular:     Rate and Rhythm: Normal rate and regular rhythm.  Pulmonary:     Effort: Pulmonary effort is normal.  Abdominal:     Palpations: Abdomen is soft.  Musculoskeletal:     Cervical back: Normal range of motion and neck supple.       Legs:  Skin:    General: Skin is warm and dry.  Neurological:     Mental Status: She is alert and oriented to person, place, and time.     Cranial Nerves: No cranial nerve deficit.     Motor: No abnormal muscle tone.     Coordination: Coordination normal.     Deep Tendon Reflexes: Reflexes are normal and symmetric. Reflexes normal.  Psychiatric:        Behavior: Behavior normal.        Thought Content: Thought content normal.        Judgment: Judgment normal.    X-rays were done of the left knee, reported  separately.       Assessment & Plan:   Encounter Diagnoses  Name Primary?  . Chronic pain of left knee Yes  . Acute pain of left knee    I have shown her the X-rays of her knee.  PROCEDURE NOTE:  The patient requests injections of the left knee , verbal consent was obtained.  The left knee was prepped appropriately after time out was performed.   Sterile technique was observed and injection of 1 cc of Depo-Medrol 40 mg with several cc's of plain xylocaine. Anesthesia was provided by ethyl chloride and a 20-gauge needle was used to inject the knee area. The injection was tolerated well.  A band aid dressing was applied.  The patient was advised to apply ice later today and tomorrow to the injection sight as needed.  I will refill her Mobic.  Return in two weeks.  Call if any problem.  Precautions discussed.   Electronically Signed Darreld Mclean, MD 10/12/202111:17 AM

## 2020-07-18 NOTE — Patient Instructions (Addendum)
Take the Meloxicam as directed Make sure you ice the knee 2-3 times for 30 minutes today and tomorrow

## 2020-07-28 ENCOUNTER — Telehealth: Payer: Self-pay | Admitting: Family Medicine

## 2020-07-28 NOTE — Telephone Encounter (Signed)
Patient needs a refill on high blood pressure meds and request it be for a 3 month supply if possible [facility] in [place] is her pharmacy p# [phone]

## 2020-07-31 ENCOUNTER — Other Ambulatory Visit: Payer: Self-pay

## 2020-07-31 DIAGNOSIS — I10 Essential (primary) hypertension: Secondary | ICD-10-CM

## 2020-07-31 MED ORDER — LISINOPRIL 20 MG PO TABS: 20.0000 mg | ORAL_TABLET | Freq: Every day | ORAL | 0 refills | Status: DC

## 2020-07-31 NOTE — Telephone Encounter (Signed)
Rx refilled.

## 2020-08-01 ENCOUNTER — Encounter: Payer: Self-pay | Admitting: Orthopaedic Surgery

## 2020-08-01 ENCOUNTER — Ambulatory Visit (INDEPENDENT_AMBULATORY_CARE_PROVIDER_SITE_OTHER): Payer: 59 | Admitting: Orthopaedic Surgery

## 2020-08-01 ENCOUNTER — Other Ambulatory Visit: Payer: Self-pay

## 2020-08-01 VITALS — BP 151/79 | HR 72 | Wt 242.0 lb

## 2020-08-01 DIAGNOSIS — M25562 Pain in left knee: Secondary | ICD-10-CM | POA: Diagnosis not present

## 2020-08-01 DIAGNOSIS — G8929 Other chronic pain: Secondary | ICD-10-CM | POA: Diagnosis not present

## 2020-08-01 DIAGNOSIS — Z6841 Body Mass Index (BMI) 40.0 and over, adult: Secondary | ICD-10-CM | POA: Diagnosis not present

## 2020-08-01 NOTE — Progress Notes (Signed)
Patient Sarah Davila, female DOB:1960/11/07, 59 y.o. KCL:[id number]  Chief Complaint  Patient presents with  . Knee Pain    left knee f/u s/p injection, helped some    HPI  Sarah Davila is a 59 y.o. female who has left knee pain.  She has improved since the injection of the knee last visit. She has less swelling.  She was unable to take the Mobic secondary to kidney problems.  She has some pain still but less.  She has no redness and no new trauma.   Body mass index is 44.26 kg/m.  The patient meets the AMA guidelines for Morbid (severe) obesity with a BMI > 40.0 and I have recommended weight loss.   ROS  Review of Systems  Constitutional: Positive for activity change.  Musculoskeletal: Positive for arthralgias, gait problem and joint swelling.  All other systems reviewed and are negative.   All other systems reviewed and are negative.  The following is a summary of the past history medically, past history surgically, known current medicines, social history and family history.  This information is gathered electronically by the computer from prior information and documentation.  I review this each visit and have found including this information at this point in the chart is beneficial and informative.    Past Medical History:  Diagnosis Date  . Coronary artery disease   . Hypertension   . Renal disorder   . Sepsis (HCC) 2015   urinary    Past Surgical History:  Procedure Laterality Date  . CYSTOSCOPY W/ URETERAL STENT PLACEMENT Bilateral 02/04/2019   Procedure: CYSTOSCOPY WITH RETROGRADE PYELOGRAM/URETERAL STENT PLACEMENT;  Surgeon: Bjorn Pippin, MD;  Location: [facility];  Service: Urology;  Laterality: Bilateral;  . CYSTOSCOPY/URETEROSCOPY/HOLMIUM LASER/STENT PLACEMENT Bilateral 02/16/2019   Procedure: CYSTOSCOPY BILATERAL URETEROSCOPY WITH HOLMIUM LASER AND STENTS PLACEMENT;  Surgeon: Bjorn Pippin, MD;  Location: [facility];  Service: Urology;  Laterality: Bilateral;     Family History  Problem Relation Age of Onset  . Hepatitis Mother   . Cirrhosis Mother   . Diabetes Father   . Cancer Other     Social History Social History   Tobacco Use  . Smoking status: Never Smoker  . Smokeless tobacco: Never Used  Vaping Use  . Vaping Use: Never used  Substance Use Topics  . Alcohol use: Never  . Drug use: Never    Allergies  Allergen Reactions  . Ciprofloxacin Other (See Comments)    Sever hot flashes    Current Outpatient Medications  Medication Sig Dispense Refill  . aspirin EC 81 MG tablet Take 81 mg by mouth daily.    Marland Kitchen docusate sodium (STOOL SOFTENER) 100 MG capsule Take 100 mg by mouth 2 (two) times daily.    . fexofenadine (ALLEGRA) 180 MG tablet Take 180 mg by mouth daily.    Marland Kitchen lisinopril (ZESTRIL) 20 MG tablet Take 1 tablet (20 mg total) by mouth daily. 90 tablet 0  . loratadine (CLARITIN) 10 MG tablet Take 10 mg by mouth daily.    . vitamin B-12 (CYANOCOBALAMIN) 250 MCG tablet Take 250 mcg by mouth daily.    . Black Cohosh 200 MG CAPS Take 2 capsules by mouth daily. (Patient not taking: Reported on 08/01/2020)    . meloxicam (MOBIC) 7.5 MG tablet One twice a day after eating. (Patient not taking: Reported on 08/01/2020) 60 tablet 5   No current facility-administered medications for this visit.     Physical Exam  Blood pressure Marland Kitchen)  151/79, pulse 72, weight 242 lb (109.8 kg), last menstrual period 02/15/2014.  Constitutional: overall normal hygiene, normal nutrition, well developed, normal grooming, normal body habitus. Assistive device:none  Musculoskeletal: gait and station Limp left, muscle tone and strength are normal, no tremors or atrophy is present.  .  Neurological: coordination overall normal.  Deep tendon reflex/nerve stretch intact.  Sensation normal.  Cranial nerves II-XII intact.   Skin:   Normal overall no scars, lesions, ulcers or rashes. No psoriasis.  Psychiatric: Alert and oriented x 3.  Recent memory  intact, remote memory unclear.  Normal mood and affect. Well groomed.  Good eye contact.  Cardiovascular: overall no swelling, no varicosities, no edema bilaterally, normal temperatures of the legs and arms, no clubbing, cyanosis and good capillary refill.  Lymphatic: palpation is normal.  Left knee has slight effusion, crepitus, ROM 0 to 110, slight limp left, NV intact.  Stable.  All other systems reviewed and are negative   The patient has been educated about the nature of the problem(s) and counseled on treatment options.  The patient appeared to understand what I have discussed and is in agreement with it.  Encounter Diagnoses  Name Primary?  . Chronic pain of left knee Yes  . Body mass index 40.0-44.9, adult (HCC)   . Morbid obesity (HCC)     PLAN Call if any problems.  Precautions discussed.  Continue current medications.   Return to clinic 1 month   Electronically Signed Sarah Mclean, MD 10/26/20212:02 PM

## 2020-08-08 ENCOUNTER — Ambulatory Visit: Payer: 59 | Admitting: Family Medicine

## 2020-08-09 ENCOUNTER — Ambulatory Visit (INDEPENDENT_AMBULATORY_CARE_PROVIDER_SITE_OTHER): Payer: 59 | Admitting: Family Medicine

## 2020-08-09 ENCOUNTER — Encounter: Payer: Self-pay | Admitting: Family Medicine

## 2020-08-09 ENCOUNTER — Other Ambulatory Visit: Payer: Self-pay

## 2020-08-09 DIAGNOSIS — K30 Functional dyspepsia: Secondary | ICD-10-CM | POA: Diagnosis not present

## 2020-08-09 DIAGNOSIS — Z1231 Encounter for screening mammogram for malignant neoplasm of breast: Secondary | ICD-10-CM

## 2020-08-09 DIAGNOSIS — Z87410 Personal history of cervical dysplasia: Secondary | ICD-10-CM

## 2020-08-09 NOTE — Assessment & Plan Note (Signed)
Improved after reducing fat content from being on a keto diet.  Does not like to take antacids secondary to having a history of kidney stones.  Overall doing well advised for her to continue the diet that she is currently on.

## 2020-08-09 NOTE — Patient Instructions (Addendum)
°  HAPPY FALL!  I appreciate the opportunity to provide you with care for your health and wellness. Today we discussed: Several concerns  Follow up: Few weeks for CPE- fasting for labs same day  No labs  Referrals today GYN for Pap smear needs Orders: Mammogram  Pending if your insurance changes we will try to help you with [facility] financial assistance program.  We will get this paperwork filled out if you do not qualify for insurance next year.   Please continue to practice social distancing to keep you, your family, and our community safe.  If you must go out, please wear a mask and practice good handwashing.  It was a pleasure to see you and I look forward to continuing to work together on your health and well-being. Please do not hesitate to call the office if you need care or have questions about your care.  Have a wonderful day and week. With Gratitude, Tereasa Coop, DNP, AGNP-BC

## 2020-08-09 NOTE — Assessment & Plan Note (Signed)
Reports history of having 2 LEEP procedures.  Has not had a Pap smear in some time.  Is willing to get follow-up on this.

## 2020-08-09 NOTE — Progress Notes (Signed)
Virtual Visit via Telephone Note   This visit type was conducted due to national recommendations for restrictions regarding the COVID-19 Pandemic (e.g. social distancing) in an effort to limit this patient's exposure and mitigate transmission in our community.  Due to her co-morbid illnesses, this patient is at least at moderate risk for complications without adequate follow up.  This format is felt to be most appropriate for this patient at this time.  The patient did not have access to video technology/had technical difficulties with video requiring transitioning to audio format only (telephone).  All issues noted in this document were discussed and addressed.  No physical exam could be performed with this format.    Evaluation Performed:  Follow-up visit  Date:  08/09/2020   ID:  Sarah Davila, Comp May 09, 1961, MRN [id number]  Patient Location: Home Provider Location: Office/Clinic offsite  Location of Patient: Home Location of Provider: Telehealth Consent was obtain for visit to be over via telehealth. I verified that I am speaking with the correct person using two identifiers.  PCP:  Freddy Finner, NP   Chief Complaint: Indigestion  History of Present Illness:    Sarah Davila is a 59 y.o. female with history as stated below.  Was on a keto diet was having a lot of indigestion advised to stop the keto diet she reports she uses her air Eunice Blase now and she did not want to take antacid secondary to kidney stones but after she stopped using actual high fat pork and oils for frying she reported that her indigestion went away. She is due for a mammogram, Pap smear and a physical.  We will get these ordered and scheduled today.   The patient does not have symptoms concerning for COVID-19 infection (fever, chills, cough, or new shortness of breath).   Past Medical, Surgical, Social History, Allergies, and Medications have been Reviewed.  Past Medical History:  Diagnosis  Date  . Coronary artery disease   . Hypertension   . Renal disorder   . Sepsis (HCC) 2015   urinary   Past Surgical History:  Procedure Laterality Date  . CYSTOSCOPY W/ URETERAL STENT PLACEMENT Bilateral 02/04/2019   Procedure: CYSTOSCOPY WITH RETROGRADE PYELOGRAM/URETERAL STENT PLACEMENT;  Surgeon: Bjorn Pippin, MD;  Location: [facility];  Service: Urology;  Laterality: Bilateral;  . CYSTOSCOPY/URETEROSCOPY/HOLMIUM LASER/STENT PLACEMENT Bilateral 02/16/2019   Procedure: CYSTOSCOPY BILATERAL URETEROSCOPY WITH HOLMIUM LASER AND STENTS PLACEMENT;  Surgeon: Bjorn Pippin, MD;  Location: [facility];  Service: Urology;  Laterality: Bilateral;     No outpatient medications have been marked as taking for the 08/09/20 encounter (Office Visit) with Freddy Finner, NP.     Allergies:   Ciprofloxacin   ROS:   Please see the history of present illness.    All other systems reviewed and are negative.   Labs/Other Tests and Data Reviewed:    Recent Labs: No results found for requested labs within last 8760 hours.   Recent Lipid Panel No results found for: CHOL, TRIG, HDL, CHOLHDL, LDLCALC, LDLDIRECT  Wt Readings from Last 3 Encounters:  08/01/20 242 lb (109.8 kg)  07/18/20 242 lb (109.8 kg)  07/11/20 246 lb (111.6 kg)     Objective:    Vital Signs:  LMP 02/15/2014 (Approximate) Comment: 5 years ago   VITAL SIGNS:  reviewed GEN:  no acute distress RESPIRATORY:  no shortness of breath noted in conversation PSYCH:  normal affect  ASSESSMENT & PLAN:     1. History  of cervical dysplasia - Ambulatory referral to Obstetrics / Gynecology  2. Encounter for screening mammogram for malignant neoplasm of breast  - MM 3D SCREEN BREAST BILATERAL; Future  3. Indigestion    Time:   Today, I have spent 7 minutes with the patient with telehealth technology discussing the above problems.     Medication Adjustments/Labs and Tests Ordered: Current medicines are reviewed at length with the patient  today.  Concerns regarding medicines are outlined above.   Tests Ordered: No orders of the defined types were placed in this encounter.   Medication Changes: No orders of the defined types were placed in this encounter.    Note: This dictation was prepared with Dragon dictation along with smaller phrase technology. Similar sounding words can be transcribed inadequately or may not be corrected upon review. Any transcriptional errors that result from this process are unintentional.      Disposition:  Follow up CPE this month  Signed, Freddy Finner, NP  08/09/2020 2:59 PM     [facility]

## 2020-08-22 ENCOUNTER — Ambulatory Visit: Payer: 59 | Admitting: Orthopaedic Surgery

## 2020-08-23 ENCOUNTER — Encounter: Payer: Self-pay | Admitting: Orthopaedic Surgery

## 2020-08-23 ENCOUNTER — Ambulatory Visit (INDEPENDENT_AMBULATORY_CARE_PROVIDER_SITE_OTHER): Payer: 59 | Admitting: Orthopaedic Surgery

## 2020-08-23 ENCOUNTER — Other Ambulatory Visit: Payer: Self-pay

## 2020-08-23 ENCOUNTER — Ambulatory Visit: Payer: 59

## 2020-08-23 VITALS — BP 151/85 | HR 76 | Ht 62.0 in | Wt 242.0 lb

## 2020-08-23 DIAGNOSIS — G8929 Other chronic pain: Secondary | ICD-10-CM

## 2020-08-23 DIAGNOSIS — M25562 Pain in left knee: Secondary | ICD-10-CM

## 2020-08-23 DIAGNOSIS — Z6841 Body Mass Index (BMI) 40.0 and over, adult: Secondary | ICD-10-CM

## 2020-08-23 NOTE — Progress Notes (Signed)
Patient Sarah Davila, female DOB:[birth year], 59 y.o. QVZ:[id number]  Chief Complaint  Patient presents with  . Knee Pain    left     HPI  Sarah Davila is a 59 y.o. female who fell a week ago and hurt the left knee.  I had seen her for chronic knee pain last month.  She has bruising of the left knee and pain.  She has no giving way.  She has no other injury.    Body mass index is 44.26 kg/m.  The patient meets the AMA guidelines for Morbid (severe) obesity with a BMI > 40.0 and I have recommended weight loss.   ROS  Review of Systems  Constitutional: Positive for activity change.  Musculoskeletal: Positive for arthralgias, gait problem and joint swelling.  All other systems reviewed and are negative.   All other systems reviewed and are negative.  The following is a summary of the past history medically, past history surgically, known current medicines, social history and family history.  This information is gathered electronically by the computer from prior information and documentation.  I review this each visit and have found including this information at this point in the chart is beneficial and informative.    Past Medical History:  Diagnosis Date  . Coronary artery disease   . Hypertension   . Renal disorder   . Sepsis (HCC) 2015   urinary    Past Surgical History:  Procedure Laterality Date  . CYSTOSCOPY W/ URETERAL STENT PLACEMENT Bilateral 02/04/2019   Procedure: CYSTOSCOPY WITH RETROGRADE PYELOGRAM/URETERAL STENT PLACEMENT;  Surgeon: Bjorn Pippin, MD;  Location: [facility];  Service: Urology;  Laterality: Bilateral;  . CYSTOSCOPY/URETEROSCOPY/HOLMIUM LASER/STENT PLACEMENT Bilateral 02/16/2019   Procedure: CYSTOSCOPY BILATERAL URETEROSCOPY WITH HOLMIUM LASER AND STENTS PLACEMENT;  Surgeon: Bjorn Pippin, MD;  Location: [facility];  Service: Urology;  Laterality: Bilateral;    Family History  Problem Relation Age of Onset  . Hepatitis Mother   . Cirrhosis  Mother   . Diabetes Father   . Cancer Other     Social History Social History   Tobacco Use  . Smoking status: Never Smoker  . Smokeless tobacco: Never Used  Vaping Use  . Vaping Use: Never used  Substance Use Topics  . Alcohol use: Never  . Drug use: Never    Allergies  Allergen Reactions  . Ciprofloxacin Other (See Comments)    Sever hot flashes    Current Outpatient Medications  Medication Sig Dispense Refill  . aspirin EC 81 MG tablet Take 81 mg by mouth daily.    . Black Cohosh 200 MG CAPS Take 2 capsules by mouth daily.     Marland Kitchen docusate sodium (STOOL SOFTENER) 100 MG capsule Take 100 mg by mouth 2 (two) times daily.    . fexofenadine (ALLEGRA) 180 MG tablet Take 180 mg by mouth daily.    Marland Kitchen lisinopril (ZESTRIL) 20 MG tablet Take 1 tablet (20 mg total) by mouth daily. 90 tablet 0  . loratadine (CLARITIN) 10 MG tablet Take 10 mg by mouth daily.    . meloxicam (MOBIC) 7.5 MG tablet One twice a day after eating. 60 tablet 5  . vitamin B-12 (CYANOCOBALAMIN) 250 MCG tablet Take 250 mcg by mouth daily.     No current facility-administered medications for this visit.     Physical Exam  Blood pressure (!) 151/85, pulse 76, height 5\' 2"  (1.575 m), weight 242 lb (109.8 kg), last menstrual period 02/15/2014.  Constitutional: overall normal hygiene,  normal nutrition, well developed, normal grooming, normal body habitus. Assistive device:none  Musculoskeletal: gait and station Limp left, muscle tone and strength are normal, no tremors or atrophy is present.  .  Neurological: coordination overall normal.  Deep tendon reflex/nerve stretch intact.  Sensation normal.  Cranial nerves II-XII intact.   Skin:   Normal overall no scars, lesions, ulcers or rashes. No psoriasis.  Psychiatric: Alert and oriented x 3.  Recent memory intact, remote memory unclear.  Normal mood and affect. Well groomed.  Good eye contact.  Cardiovascular: overall no swelling, no varicosities, no edema  bilaterally, normal temperatures of the legs and arms, no clubbing, cyanosis and good capillary refill.  Lymphatic: palpation is normal.  Left knee with pain, more laterally, contusions laterally, ROM if o to 110, crepitus, effusion, stable, NV intact.  All other systems reviewed and are negative   The patient has been educated about the nature of the problem(s) and counseled on treatment options.  The patient appeared to understand what I have discussed and is in agreement with it.  Encounter Diagnoses  Name Primary?  . Acute pain of left knee Yes  . Chronic pain of left knee   . Body mass index 40.0-44.9, adult (HCC)   . Morbid obesity (HCC)   X-rays were done of the left knee, reported separately.  PROCEDURE NOTE:  The patient requests injections of the left knee , verbal consent was obtained.  The left knee was prepped appropriately after time out was performed.   Sterile technique was observed and injection of 1 cc of Depo-Medrol 40 mg with several cc's of plain xylocaine. Anesthesia was provided by ethyl chloride and a 20-gauge needle was used to inject the knee area. The injection was tolerated well.  A band aid dressing was applied.  The patient was advised to apply ice later today and tomorrow to the injection sight as needed.   PLAN Call if any problems.  Precautions discussed.  Continue current medications.   Return to clinic as needed.    Electronically Signed Darreld Mclean, MD 11/17/20213:34 PM

## 2020-08-30 ENCOUNTER — Encounter: Payer: Self-pay | Admitting: Family Medicine

## 2020-08-30 ENCOUNTER — Ambulatory Visit (INDEPENDENT_AMBULATORY_CARE_PROVIDER_SITE_OTHER): Payer: 59 | Admitting: Family Medicine

## 2020-08-30 ENCOUNTER — Other Ambulatory Visit: Payer: Self-pay

## 2020-08-30 VITALS — BP 128/68 | HR 68 | Temp 98.0°F | Ht 62.0 in | Wt 241.0 lb

## 2020-08-30 DIAGNOSIS — Z131 Encounter for screening for diabetes mellitus: Secondary | ICD-10-CM

## 2020-08-30 DIAGNOSIS — I10 Essential (primary) hypertension: Secondary | ICD-10-CM | POA: Diagnosis not present

## 2020-08-30 DIAGNOSIS — J302 Other seasonal allergic rhinitis: Secondary | ICD-10-CM | POA: Insufficient documentation

## 2020-08-30 DIAGNOSIS — Z6841 Body Mass Index (BMI) 40.0 and over, adult: Secondary | ICD-10-CM

## 2020-08-30 DIAGNOSIS — Z0001 Encounter for general adult medical examination with abnormal findings: Secondary | ICD-10-CM | POA: Insufficient documentation

## 2020-08-30 NOTE — Assessment & Plan Note (Signed)
Discussed monthly self breast exams and yearly mammograms; at least 30 minutes of aerobic activity at least 5 days/week and weight-bearing exercise 2x/week; proper sunscreen use reviewed; healthy diet, including goals of calcium and vitamin D intake and alcohol recommendations (less than or equal to 1 drink/day) reviewed; regular seatbelt use; changing batteries in smoke detectors.  Immunization recommendations discussed.  Colonoscopy recommendations reviewed.  

## 2020-08-30 NOTE — Patient Instructions (Addendum)
  HAPPY FALL!  I appreciate the opportunity to provide you with care for your health and wellness. Today we discussed: overall health   Follow up:  6 months or as needed   ----Needs copy of [employer] [employer] Please   Labs- fasting today No referrals today  Have a wonderful holiday season and end to the year!   Please continue to practice social distancing to keep you, your family, and our community safe.  If you must go out, please wear a mask and practice good handwashing.  It was a pleasure to see you and I look forward to continuing to work together on your health and well-being. Please do not hesitate to call the office if you need care or have questions about your care.  Have a wonderful day and week. With Gratitude, Tereasa Coop, DNP, AGNP-BC  HEALTH MAINTENANCE RECOMMENDATIONS:  It is recommended that you get at least 30 minutes of aerobic exercise at least 5 days/week (for weight loss, you may need as much as 60-90 minutes). This can be any activity that gets your heart rate up. This can be divided in 10-15 minute intervals if needed, but try and build up your endurance at least once a week.  Weight bearing exercise is also recommended twice weekly.  Eat a healthy diet with lots of vegetables, fruits and fiber.  "Colorful" foods have a lot of vitamins (ie green vegetables, tomatoes, red peppers, etc).  Limit sweet tea, regular sodas and alcoholic beverages, all of which has a lot of calories and sugar.  Up to 1 alcoholic drink daily may be beneficial for women (unless trying to lose weight, watch sugars).  Drink a lot of water.  Calcium recommendations are 1200-1500 mg daily (1500 mg for postmenopausal women or women without ovaries), and vitamin D 1000 IU daily.  This should be obtained from diet and/or supplements (vitamins), and calcium should not be taken all at once, but in divided doses.  Monthly self breast exams and yearly mammograms for women over the age of 27  is recommended.  Sunscreen of at least SPF 30 should be used on all sun-exposed parts of the skin when outside between the hours of 10 am and 4 pm (not just when at beach or pool, but even with exercise, golf, tennis, and yard work!)  Use a sunscreen that says "broad spectrum" so it covers both UVA and UVB rays, and make sure to reapply every 1-2 hours.  Remember to change the batteries in your smoke detectors when changing your clock times in the spring and fall.  Use your seat belt every time you are in a car, and please drive safely and not be distracted with cell phones and texting while driving.

## 2020-08-30 NOTE — Progress Notes (Signed)
Health Maintenance reviewed -   Immunization History  Administered Date(s) Administered  . Influenza,inj,Quad PF,6+ Mos 06/10/2019, 07/06/2020  . Moderna SARS-COVID-2 Vaccination 12/25/2019, 01/26/2020  . Tdap 07/06/2020   Last Pap smear:  [facility] end of Nov Last mammogram: 03/2019 Last colonoscopy: 06/2019- good for 3 years due again 06/2022 Last DEXA: n/a Dentist: has not seen in a while, needs cleaning   Ophtho: vision good in office, has not seen an eye dr here  Exercise: busy with work-walks a lot at work  Smoker: none Alcohol Use: none  Other doctors caring for patient include:  Patient Care Team: Freddy Finner, NP as PCP - General (Family Medicine)  End of Life Discussion:  Patient does not have a living will and medical power of attorney  Subjective:   HPI  Sarah Davila is a 59 y.o. female who presents for annual comprehensive visit and follow-up on chronic medical conditions.  She has the following concerns: none  Reports that she is sleeping better now.  She denies having any trouble chewing or swallowing.  Does have a appointment to go to the health department for dental cleaning in January.  As she does lose her insurance unfortunately at the end of this year.  She is looking into the [facility] financial assistance program so she can continue to come here to the office.  She denies having any changes in bowel or bladder habits.  She denies having blood in urine or stool.  She denies having any recent changes in her memory.  Reports her recent fall secondary to her dog tripping her.  But she was seen by the orthopedist and cleared for this.  Her knee is doing much better on the left side secondary to the injections that the orthopedist provided.  She denies having any other issues or concerns to discuss.   Review Of Systems  Review of Systems  Constitutional: Negative.   HENT: Negative.   Eyes: Negative.   Respiratory: Negative.   Cardiovascular:  Negative.   Gastrointestinal: Negative.   Endocrine: Negative.   Genitourinary: Negative.   Musculoskeletal: Negative.   Skin: Negative.   Neurological: Negative.   Psychiatric/Behavioral: Negative.     Objective:   PHYSICAL EXAM:  BP 128/68 (BP Location: Right Arm, Patient Position: Sitting, Cuff Size: Normal)   Pulse 68   Temp 98 F (36.7 C) (Temporal)   Ht 5\' 2"  (1.575 m)   Wt 241 lb (109.3 kg)   LMP 02/15/2014 (Approximate) Comment: 5 years ago  SpO2 96%   BMI 44.08 kg/m   Physical Exam Vitals and nursing note reviewed.  Constitutional:      Appearance: Normal appearance. She is well-developed and well-groomed. She is morbidly obese.  HENT:     Head: Normocephalic.     Right Ear: Tympanic membrane normal.     Left Ear: Tympanic membrane normal.     Nose: Nose normal.     Mouth/Throat:     Mouth: Mucous membranes are moist.     Pharynx: Oropharynx is clear.  Eyes:     Extraocular Movements: Extraocular movements intact.     Conjunctiva/sclera: Conjunctivae normal.     Pupils: Pupils are equal, round, and reactive to light.  Cardiovascular:     Rate and Rhythm: Normal rate and regular rhythm.     Pulses: Normal pulses.          Radial pulses are 2+ on the right side and 2+ on the left side.  Dorsalis pedis pulses are 2+ on the right side and 2+ on the left side.     Heart sounds: Normal heart sounds.  Pulmonary:     Effort: Pulmonary effort is normal.     Breath sounds: Normal breath sounds.  Abdominal:     General: Abdomen is flat. Bowel sounds are normal.     Palpations: Abdomen is soft.  Musculoskeletal:        General: Normal range of motion.     Cervical back: Normal range of motion and neck supple.     Right lower leg: No edema.     Left lower leg: No edema.  Skin:    General: Skin is warm and dry.     Capillary Refill: Capillary refill takes less than 2 seconds.  Neurological:     General: No focal deficit present.     Mental Status: She is  alert and oriented to person, place, and time. Mental status is at baseline.     Cranial Nerves: Cranial nerves are intact.     Sensory: Sensation is intact.     Motor: Motor function is intact.     Gait: Gait is intact.     Deep Tendon Reflexes: Reflexes are normal and symmetric.  Psychiatric:        Attention and Perception: Attention normal.        Mood and Affect: Mood normal.        Behavior: Behavior normal. Behavior is cooperative.        Thought Content: Thought content normal.        Judgment: Judgment normal.    Depression Screening  Depression screen [facility] 2/9 08/30/2020 08/30/2020 08/09/2020 08/09/2020 07/11/2020  Decreased Interest 0 0 0 0 0  Down, Depressed, Hopeless 0 0 0 0 0  PHQ - 2 Score 0 0 0 0 0     Falls  Fall Risk  08/30/2020 08/30/2020 08/09/2020 07/11/2020 07/06/2020  Falls in the past year? 1 0 0 0 1  Number falls in past yr: 0 0 0 0 0  Injury with Fall? 1 0 0 0 0  Risk for fall due to : Impaired mobility No Fall Risks No Fall Risks - -  Follow up Falls evaluation completed Falls evaluation completed Falls evaluation completed - -    Assessment & Plan:   1. Annual visit for general adult medical examination with abnormal findings   2. Morbid obesity with BMI of 45.0-49.9, adult (HCC)   3. Essential hypertension   4. Seasonal allergies   5. Screening for diabetes mellitus     Tests ordered Orders Placed This Encounter  Procedures  . CBC  . Comprehensive metabolic panel  . Hemoglobin A1c  . Lipid panel  . TSH  . VITAMIN D 25 Hydroxy (Vit-D Deficiency, Fractures)   Plan: Please see assessment and plan per problem list above.   No orders of the defined types were placed in this encounter.  I have personally reviewed: The patient's medical and social history Their use of alcohol, tobacco or illicit drugs Their current medications and supplements The patient's functional ability including ADLs,fall risks, home safety risks, cognitive, and  hearing and visual impairment Diet and physical activities Evidence for depression or mood disorders  The patient's weight, height, BMI, and visual acuity have been recorded in the chart.  I have made referrals, counseling, and provided education to the patient based on review of the above and I have provided the patient with a written personalized  care plan for preventive services.    Note: This dictation was prepared with Dragon dictation along with smaller phrase technology. Similar sounding words can be transcribed inadequately or may not be corrected upon review. Any transcriptional errors that result from this process are unintentional.      Freddy Finner, NP   08/30/2020

## 2020-08-30 NOTE — Assessment & Plan Note (Signed)
Sarah Davila is encouraged to maintain a well balanced diet that is low in salt. Controlled, continue current medication regimen.  Additionally, she is also reminded that exercise is beneficial for heart health and control of  Blood pressure. 30-60 minutes daily is recommended-walking was suggested.

## 2020-08-30 NOTE — Assessment & Plan Note (Signed)
Unchanged  Sarah Davila is re-educated about the importance of exercise daily to help with weight management. A minumum of 30 minutes daily is recommended. Additionally, importance of healthy food choices  with portion control discussed.   Wt Readings from Last 3 Encounters:  08/30/20 241 lb (109.3 kg)  08/23/20 242 lb (109.8 kg)  08/01/20 242 lb (109.8 kg)

## 2020-08-31 LAB — COMPREHENSIVE METABOLIC PANEL
ALT: 16 IU/L (ref 0–32)
AST: 15 IU/L (ref 0–40)
Albumin/Globulin Ratio: 1.6 (ref 1.2–2.2)
Albumin: 4.4 g/dL (ref 3.8–4.9)
Alkaline Phosphatase: 146 IU/L — ABNORMAL HIGH (ref 44–121)
BUN/Creatinine Ratio: 18 (ref 9–23)
BUN: 14 mg/dL (ref 6–24)
Bilirubin Total: 0.3 mg/dL (ref 0.0–1.2)
CO2: 22 mmol/L (ref 20–29)
Calcium: 10.2 mg/dL (ref 8.7–10.2)
Chloride: 102 mmol/L (ref 96–106)
Creatinine, Ser: 0.76 mg/dL (ref 0.57–1.00)
GFR calc Af Amer: 101 mL/min/{1.73_m2} (ref >59)
GFR calc non Af Amer: 87 mL/min/{1.73_m2} (ref >59)
Globulin, Total: 2.7 g/dL (ref 1.5–4.5)
Glucose: 94 mg/dL (ref 65–99)
Potassium: 4.8 mmol/L (ref 3.5–5.2)
Sodium: 140 mmol/L (ref 134–144)
Total Protein: 7.1 g/dL (ref 6.0–8.5)

## 2020-08-31 LAB — CBC
Hematocrit: 41.3 % (ref 34.0–46.6)
Hemoglobin: 13.4 g/dL (ref 11.1–15.9)
MCH: 27.2 pg (ref 26.6–33.0)
MCHC: 32.4 g/dL (ref 31.5–35.7)
MCV: 84 fL (ref 79–97)
Platelets: 371 10*3/uL (ref 150–450)
RBC: 4.92 x10E6/uL (ref 3.77–5.28)
RDW: 14.2 % (ref 11.7–15.4)
WBC: 10.1 10*3/uL (ref 3.4–10.8)

## 2020-08-31 LAB — LIPID PANEL
Chol/HDL Ratio: 3.1 ratio (ref 0.0–4.4)
Cholesterol, Total: 244 mg/dL — ABNORMAL HIGH (ref 100–199)
HDL: 79 mg/dL (ref >39)
LDL Chol Calc (NIH): 143 mg/dL — ABNORMAL HIGH (ref 0–99)
Triglycerides: 124 mg/dL (ref 0–149)
VLDL Cholesterol Cal: 22 mg/dL (ref 5–40)

## 2020-08-31 LAB — TSH: TSH: 1.47 u[IU]/mL (ref 0.450–4.500)

## 2020-08-31 LAB — HEMOGLOBIN A1C
Est. average glucose Bld gHb Est-mCnc: 117 mg/dL
Hgb A1c MFr Bld: 5.7 % — ABNORMAL HIGH (ref 4.8–5.6)

## 2020-08-31 LAB — VITAMIN D 25 HYDROXY (VIT D DEFICIENCY, FRACTURES): Vit D, 25-Hydroxy: 19.5 ng/mL — ABNORMAL LOW (ref 30.0–100.0)

## 2020-09-05 ENCOUNTER — Other Ambulatory Visit: Payer: Self-pay | Admitting: Family Medicine

## 2020-09-05 ENCOUNTER — Encounter: Payer: 59 | Admitting: Adult Health

## 2020-09-05 ENCOUNTER — Telehealth: Payer: Self-pay

## 2020-09-05 DIAGNOSIS — E559 Vitamin D deficiency, unspecified: Secondary | ICD-10-CM

## 2020-09-05 MED ORDER — VITAMIN D (ERGOCALCIFEROL) 1.25 MG (50000 UNIT) PO CAPS: 50000.0000 [IU] | ORAL_CAPSULE | ORAL | 1 refills | Status: DC

## 2020-09-05 NOTE — Telephone Encounter (Signed)
Pt says she seen you a few weeks ago and informed you she is going to lose her insurance soon. She was given a 3 months supply of her bp med. She wanted to know when the time comes for her to need a refill would you be able to do this for her, another 3 month supply? Please advise.

## 2020-09-05 NOTE — Telephone Encounter (Signed)
Yes, happy to until her insurance is situated.  Just have her call when refill is due.

## 2020-09-06 NOTE — Telephone Encounter (Signed)
Pt informed

## 2020-09-11 ENCOUNTER — Telehealth: Payer: Self-pay

## 2020-09-11 NOTE — Telephone Encounter (Signed)
If the pain persists, she should see her PCP first since it sounds musculoskeletal.

## 2020-09-12 ENCOUNTER — Other Ambulatory Visit: Payer: Self-pay

## 2020-09-12 ENCOUNTER — Ambulatory Visit: Payer: 59 | Admitting: Orthopaedic Surgery

## 2020-09-12 ENCOUNTER — Telehealth: Payer: Self-pay

## 2020-09-12 ENCOUNTER — Emergency Department (HOSPITAL_COMMUNITY): Payer: 59

## 2020-09-12 ENCOUNTER — Emergency Department (HOSPITAL_COMMUNITY)
Admission: EM | Admit: 2020-09-12 | Discharge: 2020-09-12 | Disposition: A | Discharge status: Home / Self Care | Payer: 59 | Attending: Emergency Medicine | Admitting: Emergency Medicine

## 2020-09-12 ENCOUNTER — Encounter (HOSPITAL_COMMUNITY): Payer: Self-pay | Admitting: *Deleted

## 2020-09-12 DIAGNOSIS — I2510 Atherosclerotic heart disease of native coronary artery without angina pectoris: Secondary | ICD-10-CM | POA: Insufficient documentation

## 2020-09-12 DIAGNOSIS — I10 Essential (primary) hypertension: Secondary | ICD-10-CM | POA: Insufficient documentation

## 2020-09-12 DIAGNOSIS — R109 Unspecified abdominal pain: Secondary | ICD-10-CM | POA: Diagnosis not present

## 2020-09-12 DIAGNOSIS — Z87442 Personal history of urinary calculi: Secondary | ICD-10-CM | POA: Insufficient documentation

## 2020-09-12 DIAGNOSIS — Z7982 Long term (current) use of aspirin: Secondary | ICD-10-CM | POA: Insufficient documentation

## 2020-09-12 DIAGNOSIS — M545 Low back pain, unspecified: Secondary | ICD-10-CM | POA: Diagnosis not present

## 2020-09-12 DIAGNOSIS — Z79899 Other long term (current) drug therapy: Secondary | ICD-10-CM | POA: Insufficient documentation

## 2020-09-12 HISTORY — DX: Calculus of kidney: N20.0

## 2020-09-12 LAB — URINALYSIS, ROUTINE W REFLEX MICROSCOPIC
Bilirubin Urine: NEGATIVE
Glucose, UA: NEGATIVE mg/dL
Hgb urine dipstick: NEGATIVE
Ketones, ur: NEGATIVE mg/dL
Leukocytes,Ua: NEGATIVE
Nitrite: NEGATIVE
Protein, ur: NEGATIVE mg/dL
Specific Gravity, Urine: 1.005 (ref 1.005–1.030)
pH: 5 (ref 5.0–8.0)

## 2020-09-12 LAB — BASIC METABOLIC PANEL
Anion gap: 12 (ref 5–15)
BUN: 21 mg/dL — ABNORMAL HIGH (ref 6–20)
CO2: 24 mmol/L (ref 22–32)
Calcium: 9.9 mg/dL (ref 8.9–10.3)
Chloride: 105 mmol/L (ref 98–111)
Creatinine, Ser: 0.75 mg/dL (ref 0.44–1.00)
GFR, Estimated: >60 mL/min (ref >60)
Glucose, Bld: 94 mg/dL (ref 70–99)
Potassium: 5.3 mmol/L — ABNORMAL HIGH (ref 3.5–5.1)
Sodium: 141 mmol/L (ref 135–145)

## 2020-09-12 LAB — CBC WITH DIFFERENTIAL/PLATELET
Abs Immature Granulocytes: 0.07 10*3/uL (ref 0.00–0.07)
Basophils Absolute: 0.1 10*3/uL (ref 0.0–0.1)
Basophils Relative: 1 %
Eosinophils Absolute: 0.4 10*3/uL (ref 0.0–0.5)
Eosinophils Relative: 3 %
HCT: 43.8 % (ref 36.0–46.0)
Hemoglobin: 13.5 g/dL (ref 12.0–15.0)
Immature Granulocytes: 1 %
Lymphocytes Relative: 30 %
Lymphs Abs: 3.2 10*3/uL (ref 0.7–4.0)
MCH: 28 pg (ref 26.0–34.0)
MCHC: 30.8 g/dL (ref 30.0–36.0)
MCV: 90.7 fL (ref 80.0–100.0)
Monocytes Absolute: 0.7 10*3/uL (ref 0.1–1.0)
Monocytes Relative: 6 %
Neutro Abs: 6.3 10*3/uL (ref 1.7–7.7)
Neutrophils Relative %: 59 %
Platelets: 390 10*3/uL (ref 150–400)
RBC: 4.83 MIL/uL (ref 3.87–5.11)
RDW: 14 % (ref 11.5–15.5)
WBC: 10.7 10*3/uL — ABNORMAL HIGH (ref 4.0–10.5)
nRBC: 0 % (ref 0.0–0.2)

## 2020-09-12 MED ORDER — METHOCARBAMOL 500 MG PO TABS: 500.0000 mg | ORAL_TABLET | Freq: Three times a day (TID) | ORAL | 0 refills | Status: DC

## 2020-09-12 NOTE — Telephone Encounter (Signed)
Pt made aware

## 2020-09-12 NOTE — Telephone Encounter (Signed)
Pt called v/m stating that she thinks she may have a kidney stone. Her urologist could not see her so he told her to call her pcp. She said she has had sepsis in the past and she does not want it to get that bad again and so she wanted to know if you suggested her to just go to the ER because she generally cannot pass them anyways. Please advise.

## 2020-09-12 NOTE — Telephone Encounter (Signed)
Yes, advise ED as I would need to get UA and xray to confirm.

## 2020-09-12 NOTE — ED Provider Notes (Signed)
[facility] EMERGENCY DEPARTMENT Provider Note   CSN: [id number] Arrival date & time: 09/12/20  1035     History Chief Complaint  Patient presents with  . Flank Pain    Sarah Davila is a 59 y.o. female.  HPI      Sarah Davila is a 59 y.o. female who presents to the Emergency Department complaining of right flank pain for 5 days.  She states that she was helping someone push a table up some stairs 1 week ago and noticed pain to her back.  Initially, pain was severe and now rates pain as a 3 or 4.  She has been taking ibuprofen with minimal relief.  She states the pain has began to radiate to her right abdomen and she is concerned that her pain may be related to a urinary tract infection or kidney stone.  She states the pain feels similar to previous kidney stone.  She notes history of urosepsis with hospitalization 7 years ago and had multiple bilateral kidney stones at that time.  She also endorses noticing "bubbles" in her urine, but denies blood or dysuria, no fever or chills, no nausea or vomiting, no urinary hesitancy.    Past Medical History:  Diagnosis Date  . Coronary artery disease   . Hypertension   . Kidney stone   . Renal disorder   . Sepsis (HCC) 2015   urinary    Patient Active Problem List   Diagnosis Date Noted  . Annual visit for general adult medical examination with abnormal findings 08/30/2020  . Essential hypertension 08/30/2020  . Seasonal allergies 08/30/2020  . History of cervical dysplasia 08/09/2020  . Encounter for screening mammogram for malignant neoplasm of breast 08/09/2020  . Acute pain of left knee 07/11/2020  . Indigestion 07/06/2020  . Need for immunization against influenza 07/06/2020  . Immunization due 07/06/2020  . Tympanic membrane inflammation 07/06/2020  . Otitis media 05/26/2020  . Morbid obesity with BMI of 45.0-49.9, adult (HCC) 09/09/2019  . Insomnia 05/18/2019    Past Surgical History:  Procedure Laterality  Date  . CYSTOSCOPY W/ URETERAL STENT PLACEMENT Bilateral 02/04/2019   Procedure: CYSTOSCOPY WITH RETROGRADE PYELOGRAM/URETERAL STENT PLACEMENT;  Surgeon: Bjorn Pippin, MD;  Location: [facility];  Service: Urology;  Laterality: Bilateral;  . CYSTOSCOPY/URETEROSCOPY/HOLMIUM LASER/STENT PLACEMENT Bilateral 02/16/2019   Procedure: CYSTOSCOPY BILATERAL URETEROSCOPY WITH HOLMIUM LASER AND STENTS PLACEMENT;  Surgeon: Bjorn Pippin, MD;  Location: [facility];  Service: Urology;  Laterality: Bilateral;     OB History    Gravida      Para      Term      Preterm      AB      Living  0     SAB      TAB      Ectopic      Multiple      Live Births              Family History  Problem Relation Age of Onset  . Hepatitis Mother   . Cirrhosis Mother   . Diabetes Father   . Cancer Other     Social History   Tobacco Use  . Smoking status: Never Smoker  . Smokeless tobacco: Never Used  Vaping Use  . Vaping Use: Never used  Substance Use Topics  . Alcohol use: Never  . Drug use: Never    Home Medications Prior to Admission medications   Medication Sig Start Date End Date Taking?  Authorizing Provider  aspirin EC 81 MG tablet Take 81 mg by mouth daily.    [provider]  Black Cohosh 200 MG CAPS Take 2 capsules by mouth daily.     [provider]  docusate sodium (STOOL SOFTENER) 100 MG capsule Take 100 mg by mouth 2 (two) times daily.    [provider]  fexofenadine (ALLEGRA) 180 MG tablet Take 180 mg by mouth daily.    [provider]  lisinopril (ZESTRIL) 20 MG tablet Take 1 tablet (20 mg total) by mouth daily. 07/31/20   Freddy Finner, NP  loratadine (CLARITIN) 10 MG tablet Take 10 mg by mouth daily.    [provider]  meloxicam (MOBIC) 7.5 MG tablet One twice a day after eating. 07/18/20   Darreld Mclean, MD  vitamin B-12 (CYANOCOBALAMIN) 250 MCG tablet Take 250 mcg by mouth daily.    [provider]  Vitamin D,  Ergocalciferol, (DRISDOL) 1.25 MG (50000 UNIT) CAPS capsule Take 1 capsule (50,000 Units total) by mouth every 7 (seven) days. 09/05/20   Freddy Finner, NP    Allergies    Ciprofloxacin  Review of Systems   Review of Systems  Constitutional: Negative for activity change, appetite change, chills and fever.  Respiratory: Negative for chest tightness and shortness of breath.   Cardiovascular: Negative for chest pain.  Gastrointestinal: Negative for abdominal pain, nausea and vomiting.  Genitourinary: Positive for flank pain. Negative for decreased urine volume, difficulty urinating, dysuria, frequency, hematuria, urgency, vaginal bleeding and vaginal discharge.       "bubbles" in her urine  Musculoskeletal: Positive for back pain.  Skin: Negative for rash.  Neurological: Negative for dizziness, weakness and numbness.  Hematological: Negative for adenopathy.  Psychiatric/Behavioral: Negative for confusion.    Physical Exam Updated Vital Signs BP (!) 171/83 (BP Location: Right Arm)   Pulse 62   Temp 98.1 F (36.7 C) (Oral)   Resp 16   Ht 5\' 2"  (1.575 m)   Wt 109.3 kg   LMP 02/15/2014 (Approximate) Comment: 5 years ago  SpO2 100%   BMI 44.08 kg/m   Physical Exam Vitals and nursing note reviewed.  Constitutional:      General: She is not in acute distress.    Appearance: Normal appearance. She is well-developed.  HENT:     Head: Normocephalic and atraumatic.  Cardiovascular:     Rate and Rhythm: Normal rate and regular rhythm.     Pulses: Normal pulses.     Heart sounds: No murmur heard.   Pulmonary:     Effort: Pulmonary effort is normal. No respiratory distress.     Breath sounds: Normal breath sounds. No wheezing or rales.  Abdominal:     General: There is no distension.     Palpations: Abdomen is soft. Abdomen is not rigid. There is no mass.     Tenderness: There is no abdominal tenderness. There is no right CVA tenderness, left CVA tenderness, guarding or rebound.  Negative signs include McBurney's sign.  Musculoskeletal:        General: Normal range of motion.     Right lower leg: No edema.     Left lower leg: No edema.     Comments: Mild ttp of the right lumbar paraspinal muscles.  No midline tenderness, no CVA tenderness.   Skin:    General: Skin is warm.     Capillary Refill: Capillary refill takes less than 2 seconds.     Findings: No rash.  Neurological:     General: No focal deficit present.     Mental Status: She is alert.     Sensory: No sensory deficit.     Motor: No weakness.     ED Results / Procedures / Treatments   Labs (all labs ordered are listed, but only abnormal results are displayed) Labs Reviewed  URINALYSIS, ROUTINE W REFLEX MICROSCOPIC - Abnormal; Notable for the following components:      Result Value   Color, Urine STRAW (*)    All other components within normal limits  CBC WITH DIFFERENTIAL/PLATELET  BASIC METABOLIC PANEL    EKG None  Radiology CT Renal Stone Study  Result Date: 09/12/2020 CLINICAL DATA:  Right flank pain. EXAM: CT ABDOMEN AND PELVIS WITHOUT CONTRAST TECHNIQUE: Multidetector CT imaging of the abdomen and pelvis was performed following the standard protocol without IV contrast. COMPARISON:  CT February 02, 2019. FINDINGS: Lower chest: No acute abnormality. Hepatobiliary: No focal liver abnormality is seen. Similar appearance of multiple gallstones. No gallbladder wall thickening or inflammatory change. Mild hepatomegaly. Pancreas: Unremarkable. No pancreatic ductal dilatation or surrounding inflammatory changes. Spleen: Normal in size without focal abnormality. Adrenals/Urinary Tract: Normal adrenal glands. There are multiple bilateral renal calculi. The largest is on the right and measures approximately 5 mm and is interpolar location. No hydronephrosis. No ureteral calculi identified. No ureteral nephrosis. Stomach/Bowel: Stomach is within normal limits. Appendix appears normal. No evidence of bowel  wall thickening, distention, or inflammatory changes. Stool throughout the colon. Vascular/Lymphatic: Aortic atherosclerosis. No enlarged abdominal or pelvic lymph nodes. Reproductive: Uterus and bilateral adnexa are unremarkable. IUD in place. Other: No significant free fluid. Musculoskeletal: Multilevel degenerative disc disease. IMPRESSION: 1. Bilateral nephrolithiasis without evidence of hydronephrosis or ureteral calculus. 2. Cholelithiasis. 3. Moderate colonic stool burden. Electronically Signed   By: Feliberto Harts MD   On: 09/12/2020 14:41    Procedures Procedures (including critical care time)  Medications Ordered in ED Medications - No data to display  ED Course  I have reviewed the triage vital signs and the nursing notes.  Pertinent labs & imaging results that were available during my care of the patient were reviewed by me and considered in my medical decision making (see chart for details).    MDM Rules/Calculators/A&P                          Patient here with right-sided flank pain and history of kidney stones.  Denies hematuria or dysuria but states that she noticed "bubbles" in her urine since yesterday.  No fever, nausea, or vomiting.  She does endorse pushing a table up some stairs 1 week ago.  This pain may be musculoskeletal, but given her history I will check labs and obtain CT renal stone study.  On recheck, pt resting comfortably.  Ambulated to restroom w/o difficulty, gait steady.  CT stone study w/o evidence of ureteral stone or hydro, cholelithiasis present, pt aware.  Labs unremarkable.    Right flank/back pain felt to be musculoskeletal.  Pt appropriate for d/c home.  Agrees to close out patient f/u.     Final Clinical Impression(s) / ED Diagnoses Final diagnoses:  Right flank pain    Rx / DC Orders ED Discharge Orders    None       Rosey Bath 09/14/20 2151    Bethann Berkshire, MD 09/15/20 1024

## 2020-09-12 NOTE — Telephone Encounter (Signed)
Pt informed

## 2020-09-12 NOTE — ED Triage Notes (Signed)
Pt with right flank pain, hx of kidney stones.  Pain since last Thursday. Denies any blood in urine but states urine was "bubbly"

## 2020-09-12 NOTE — Discharge Instructions (Addendum)
Your urine test did not show any evidence of infection. Your CT scan shows you have some kidney stones in both kidneys with the largest one being in the right kidney.  These stones are in the kidneys, not in the ureters and not likely the cause of your flank pain.  I feel your flank pain is likely muscular.  Try alternating ice and heat to the area, take the muscle relaxer and your ibuprofen as directed.  Also, your potassium level today was slightly elevated.  Please follow up with your primary provider in one week to have this rechecked.  Return here for any worsening symptoms .

## 2020-09-19 ENCOUNTER — Encounter: Payer: 59 | Admitting: Adult Health

## 2020-09-21 ENCOUNTER — Ambulatory Visit (INDEPENDENT_AMBULATORY_CARE_PROVIDER_SITE_OTHER): Payer: 59 | Admitting: Nurse Practitioner

## 2020-09-21 ENCOUNTER — Encounter: Payer: Self-pay | Admitting: Nurse Practitioner

## 2020-09-21 ENCOUNTER — Other Ambulatory Visit: Payer: Self-pay

## 2020-09-21 DIAGNOSIS — H6502 Acute serous otitis media, left ear: Secondary | ICD-10-CM

## 2020-09-21 MED ORDER — IBUPROFEN 800 MG PO TABS: 800.0000 mg | ORAL_TABLET | Freq: Three times a day (TID) | ORAL | 0 refills | Status: DC | PRN

## 2020-09-21 MED ORDER — FLUTICASONE PROPIONATE 50 MCG/ACT NA SUSP: 2.0000 | Freq: Two times a day (BID) | NASAL | 6 refills | Status: DC

## 2020-09-21 MED ORDER — AZITHROMYCIN 250 MG PO TABS: ORAL_TABLET | ORAL | 0 refills | Status: DC

## 2020-09-21 NOTE — Progress Notes (Signed)
Acute Office Visit  Subjective:    Patient ID: Sarah Davila, female    DOB: 1961/07/18, 59 y.o.   MRN: [id number]  Chief Complaint  Patient presents with  . Otalgia    X 1 week     HPI Patient is in today for left ear pain.  Several weeks ago, she states that after flossing she felt intense ear pain.  She was treated with abx by Dahlia Client.  She had a left lower molar extracted 2 days ago 09/19/20 and she is having residual pain.  She has bruising to her left jaw as well as a small area of sublingual erythema.  Past Medical History:  Diagnosis Date  . Coronary artery disease   . Hypertension   . Kidney stone   . Renal disorder   . Sepsis (HCC) 2015   urinary    Past Surgical History:  Procedure Laterality Date  . CYSTOSCOPY W/ URETERAL STENT PLACEMENT Bilateral 02/04/2019   Procedure: CYSTOSCOPY WITH RETROGRADE PYELOGRAM/URETERAL STENT PLACEMENT;  Surgeon: Bjorn Pippin, MD;  Location: [facility];  Service: Urology;  Laterality: Bilateral;  . CYSTOSCOPY/URETEROSCOPY/HOLMIUM LASER/STENT PLACEMENT Bilateral 02/16/2019   Procedure: CYSTOSCOPY BILATERAL URETEROSCOPY WITH HOLMIUM LASER AND STENTS PLACEMENT;  Surgeon: Bjorn Pippin, MD;  Location: [facility];  Service: Urology;  Laterality: Bilateral;    Family History  Problem Relation Age of Onset  . Hepatitis Mother   . Cirrhosis Mother   . Diabetes Father   . Cancer Other     Social History   Socioeconomic History  . Marital status: Divorced    Spouse name: Not on file  . Number of children: Not on file  . Years of education: Not on file  . Highest education level: Not on file  Occupational History  . Not on file  Tobacco Use  . Smoking status: Never Smoker  . Smokeless tobacco: Never Used  Vaping Use  . Vaping Use: Never used  Substance and Sexual Activity  . Alcohol use: Never  . Drug use: Never  . Sexual activity: Yes    Birth control/protection: I.U.D.  Other Topics Concern  . Not on file  Social History  Narrative  . Not on file   Social Determinants of Health   Financial Resource Strain: Low Risk   . Difficulty of Paying Living Expenses: Not hard at all  Food Insecurity: No Food Insecurity  . Worried About Programme researcher, broadcasting/film/video in the Last Year: Never true  . Ran Out of Food in the Last Year: Never true  Transportation Needs: No Transportation Needs  . Lack of Transportation (Medical): No  . Lack of Transportation (Non-Medical): No  Physical Activity: Sufficiently Active  . Days of Exercise per Week: 7 days  . Minutes of Exercise per Session: 60 min  Stress: No Stress Concern Present  . Feeling of Stress : Not at all  Social Connections: Socially Isolated  . Frequency of Communication with Friends and Family: More than three times a week  . Frequency of Social Gatherings with Friends and Family: Never  . Attends Religious Services: Never  . Active Member of Clubs or Organizations: No  . Attends Banker Meetings: Never  . Marital Status: Divorced  Catering manager Violence: Not At Risk  . Fear of Current or Ex-Partner: No  . Emotionally Abused: No  . Physically Abused: No  . Sexually Abused: No    Outpatient Medications Prior to Visit  Medication Sig Dispense Refill  . aspirin EC 81  MG tablet Take 81 mg by mouth daily.    . Black Cohosh 200 MG CAPS Take 2 capsules by mouth daily.     . cetirizine (ZYRTEC) 10 MG tablet Take 10 mg by mouth daily.    Marland Kitchen docusate sodium (COLACE) 100 MG capsule Take 100 mg by mouth daily.     . fexofenadine (ALLEGRA) 180 MG tablet Take 180 mg by mouth daily.    Marland Kitchen lisinopril (ZESTRIL) 20 MG tablet Take 1 tablet (20 mg total) by mouth daily. 90 tablet 0  . Vitamin D, Ergocalciferol, (DRISDOL) 1.25 MG (50000 UNIT) CAPS capsule Take 1 capsule (50,000 Units total) by mouth every 7 (seven) days. 12 capsule 1  . meloxicam (MOBIC) 7.5 MG tablet One twice a day after eating. 60 tablet 5  . methocarbamol (ROBAXIN) 500 MG tablet Take 1 tablet (500  mg total) by mouth 3 (three) times daily. 21 tablet 0   No facility-administered medications prior to visit.    Allergies  Allergen Reactions  . Ciprofloxacin Other (See Comments)    Sever hot flashes    Review of Systems  HENT: Positive for dental problem, ear pain and facial swelling. Negative for congestion.        Per HPI       Objective:    Physical Exam HENT:     Head:     Jaw: Tenderness, swelling and pain on movement present.      Right Ear: Hearing, tympanic membrane, ear canal and external ear normal.     Left Ear: Hearing and ear canal normal. A middle ear effusion is present. There is mastoid tenderness. Tympanic membrane is not injected, perforated or erythematous.     Ears:     Comments: Clear effusion behind left TM    BP (!) 157/83   Pulse 84   Temp 99 F (37.2 C)   Resp 20   Ht 5\' 2"  (1.575 m)   Wt 240 lb (108.9 kg)   LMP 02/15/2014 (Approximate) Comment: 5 years ago  SpO2 95%   BMI 43.90 kg/m  Wt Readings from Last 3 Encounters:  09/21/20 240 lb (108.9 kg)  09/12/20 241 lb (109.3 kg)  08/30/20 241 lb (109.3 kg)    Health Maintenance Due  Topic Date Due  . Hepatitis C Screening  Never done  . HIV Screening  Never done  . PAP SMEAR-Modifier  Never done  . COVID-19 Vaccine (3 - Booster for Moderna series) 07/27/2020    There are no preventive care reminders to display for this patient.   Lab Results  Component Value Date   TSH 1.470 08/30/2020   Lab Results  Component Value Date   WBC 10.7 (H) 09/12/2020   HGB 13.5 09/12/2020   HCT 43.8 09/12/2020   MCV 90.7 09/12/2020   PLT 390 09/12/2020   Lab Results  Component Value Date   NA 141 09/12/2020   K 5.3 (H) 09/12/2020   CO2 24 09/12/2020   GLUCOSE 94 09/12/2020   BUN 21 (H) 09/12/2020   CREATININE 0.75 09/12/2020   BILITOT 0.3 08/30/2020   ALKPHOS 146 (H) 08/30/2020   AST 15 08/30/2020   ALT 16 08/30/2020   PROT 7.1 08/30/2020   ALBUMIN 4.4 08/30/2020   CALCIUM 9.9  09/12/2020   ANIONGAP 12 09/12/2020   Lab Results  Component Value Date   CHOL 244 (H) 08/30/2020   Lab Results  Component Value Date   HDL 79 08/30/2020   Lab Results  Component  Value Date   LDLCALC 143 (H) 08/30/2020   Lab Results  Component Value Date   TRIG 124 08/30/2020   Lab Results  Component Value Date   CHOLHDL 3.1 08/30/2020   Lab Results  Component Value Date   HGBA1C 5.7 (H) 08/30/2020       Assessment & Plan:   Problem List Items Addressed This Visit      Nervous and Auditory   Otitis media    -serous otitis media today -Rx. flonase -she had a dental procedure 2 days ago and is having intense pain to her left ear -Rx. Azithromycin -Rx. Ibuprofen for pain      Relevant Medications   azithromycin (ZITHROMAX) 250 MG tablet       Meds ordered this encounter  Medications  . azithromycin (ZITHROMAX) 250 MG tablet    Sig: Dispense as a z-pack    Dispense:  6 tablet    Refill:  0  . ibuprofen (ADVIL) 800 MG tablet    Sig: Take 1 tablet (800 mg total) by mouth every 8 (eight) hours as needed.    Dispense:  30 tablet    Refill:  0  . fluticasone (FLONASE) 50 MCG/ACT nasal spray    Sig: Place 2 sprays into both nostrils in the morning and at bedtime. Use 2 sprays in each nostril BID for a week, then can cut back to 1 spray BID PRN for congestion/ear fluid    Dispense:  16 g    Refill:  6     Heather Roberts, NP

## 2020-09-21 NOTE — Assessment & Plan Note (Addendum)
-  serous otitis media today -Rx. flonase -she had a dental procedure 2 days ago and is having intense pain to her left ear -Rx. Azithromycin -Rx. Ibuprofen for pain

## 2020-09-22 ENCOUNTER — Telehealth: Payer: 59 | Admitting: Family Medicine

## 2020-10-05 ENCOUNTER — Encounter: Payer: 59 | Admitting: Adult Health

## 2020-11-02 ENCOUNTER — Ambulatory Visit: Payer: 59 | Admitting: Urology

## 2020-11-09 ENCOUNTER — Telehealth: Payer: Self-pay

## 2020-11-09 ENCOUNTER — Other Ambulatory Visit: Payer: Self-pay

## 2020-11-09 DIAGNOSIS — N201 Calculus of ureter: Secondary | ICD-10-CM

## 2020-11-09 NOTE — Telephone Encounter (Signed)
Pt called- reports new stone since last seen. Scheduled appt with MD

## 2020-11-09 NOTE — Telephone Encounter (Signed)
KUB order. Will notify patient

## 2020-11-09 NOTE — Telephone Encounter (Signed)
She will need a KUB prior to f/u.  She had a recent CT that showed renal stones but no ureteral stones.

## 2020-11-14 ENCOUNTER — Other Ambulatory Visit: Payer: Self-pay

## 2020-11-14 DIAGNOSIS — I10 Essential (primary) hypertension: Secondary | ICD-10-CM

## 2020-11-14 MED ORDER — LISINOPRIL 20 MG PO TABS: 20.0000 mg | ORAL_TABLET | Freq: Every day | ORAL | 0 refills | Status: DC

## 2020-11-15 NOTE — Progress Notes (Signed)
Subjective:  1. Renal calculus, bilateral   2. Renal calculi      I have kidney stones.   HPI: Sarah Davila returns today in f/u.    She was back in the ER with pain in December and was found on CT to have a 96mm right upper pole stone that was stable since 4/20 and she has stable LLP stones.  Her pain was felt to be musculoskeletal.  She is currently symptom free.   She has passed stones since with the last in 8/20.     Sarah Davila returns today in f/u for her history of stones. She had a bilateral URS 02/16/19. She has been oding well and has had no flank pain or hematuria since her stents were removed. A KUB prior to the visit showed a 76mm LLP stone and calcifications in the RUQ that are gallstones. Renal US showed bilateral small lower pole stones but no obstruction. She continues to have some hoarseness following her initial procedure for stenting in May.   Her stone was mixed calcium oxalate. A hypercalcuria profile was normal with the exception of a uric acid of 7.9.  24 hour urine for stone risk showed an elevated calcium and high pH.         ROS:  ROS:  A complete review of systems was performed.  All systems are negative except for pertinent findings as noted.   ROS  Allergies  Allergen Reactions  . Ciprofloxacin Other (See Comments)    Sever hot flashes    Outpatient Encounter Medications as of 11/16/2020  Medication Sig  . aspirin EC 81 MG tablet Take 81 mg by mouth daily.  Marland Kitchen azithromycin (ZITHROMAX) 250 MG tablet Dispense as a z-pack  . Black Cohosh 200 MG CAPS Take 2 capsules by mouth daily.   . cetirizine (ZYRTEC) 10 MG tablet Take 10 mg by mouth daily.  Marland Kitchen docusate sodium (COLACE) 100 MG capsule Take 100 mg by mouth daily.   . fexofenadine (ALLEGRA) 180 MG tablet Take 180 mg by mouth daily.  . fluticasone (FLONASE) 50 MCG/ACT nasal spray Place 2 sprays into both nostrils in the morning and at bedtime. Use 2 sprays in each nostril BID for a week, then can cut  back to 1 spray BID PRN for congestion/ear fluid  . ibuprofen (ADVIL) 800 MG tablet Take 1 tablet (800 mg total) by mouth every 8 (eight) hours as needed.  Marland Kitchen lisinopril (ZESTRIL) 20 MG tablet Take 1 tablet (20 mg total) by mouth daily.  . Vitamin D, Ergocalciferol, (DRISDOL) 1.25 MG (50000 UNIT) CAPS capsule Take 1 capsule (50,000 Units total) by mouth every 7 (seven) days.   No facility-administered encounter medications on file as of 11/16/2020.    Past Medical History:  Diagnosis Date  . Coronary artery disease   . Hypertension   . Kidney stone   . Renal disorder   . Sepsis (HCC) 2015   urinary    Past Surgical History:  Procedure Laterality Date  . CYSTOSCOPY W/ URETERAL STENT PLACEMENT Bilateral 02/04/2019   Procedure: CYSTOSCOPY WITH RETROGRADE PYELOGRAM/URETERAL STENT PLACEMENT;  Surgeon: Bjorn Pippin, MD;  Location: [facility];  Service: Urology;  Laterality: Bilateral;  . CYSTOSCOPY/URETEROSCOPY/HOLMIUM LASER/STENT PLACEMENT Bilateral 02/16/2019   Procedure: CYSTOSCOPY BILATERAL URETEROSCOPY WITH HOLMIUM LASER AND STENTS PLACEMENT;  Surgeon: Bjorn Pippin, MD;  Location: [facility];  Service: Urology;  Laterality: Bilateral;    Social History   Socioeconomic History  . Marital status: Divorced    Spouse name: Not  on file  . Number of children: Not on file  . Years of education: Not on file  . Highest education level: Not on file  Occupational History  . Not on file  Tobacco Use  . Smoking status: Never Smoker  . Smokeless tobacco: Never Used  Vaping Use  . Vaping Use: Never used  Substance and Sexual Activity  . Alcohol use: Never  . Drug use: Never  . Sexual activity: Yes    Birth control/protection: I.U.D.  Other Topics Concern  . Not on file  Social History Narrative  . Not on file   Social Determinants of Health   Financial Resource Strain: Low Risk   . Difficulty of Paying Living Expenses: Not hard at all  Food Insecurity: No Food Insecurity  . Worried About  Programme researcher, broadcasting/film/video in the Last Year: Never true  . Ran Out of Food in the Last Year: Never true  Transportation Needs: No Transportation Needs  . Lack of Transportation (Medical): No  . Lack of Transportation (Non-Medical): No  Physical Activity: Sufficiently Active  . Days of Exercise per Week: 7 days  . Minutes of Exercise per Session: 60 min  Stress: No Stress Concern Present  . Feeling of Stress : Not at all  Social Connections: Socially Isolated  . Frequency of Communication with Friends and Family: More than three times a week  . Frequency of Social Gatherings with Friends and Family: Never  . Attends Religious Services: Never  . Active Member of Clubs or Organizations: No  . Attends Banker Meetings: Never  . Marital Status: Divorced  Catering manager Violence: Not At Risk  . Fear of Current or Ex-Partner: No  . Emotionally Abused: No  . Physically Abused: No  . Sexually Abused: No    Family History  Problem Relation Age of Onset  . Hepatitis Mother   . Cirrhosis Mother   . Diabetes Father   . Cancer Other        Objective: Vitals:   11/16/20 1353  BP: (!) 145/77  Pulse: 91  Temp: 98.7 F (37.1 C)     Physical Exam  Lab Results:  UA clear.  Results for orders placed or performed in visit on 11/16/20 (from the past 24 hour(s))  Urinalysis, Routine w reflex microscopic     Status: None   Collection Time: 11/16/20  2:16 PM  Result Value Ref Range   Specific Gravity, UA 1.015 1.005 - 1.030   pH, UA 6.0 5.0 - 7.5   Color, UA Yellow Yellow   Appearance Ur Clear Clear   Leukocytes,UA Negative Negative   Protein,UA Negative Negative/Trace   Glucose, UA Negative Negative   Ketones, UA Negative Negative   RBC, UA Negative Negative   Bilirubin, UA Negative Negative   Urobilinogen, Ur 0.2 0.2 - 1.0 mg/dL   Nitrite, UA Negative Negative   Microscopic Examination Comment    Narrative   Performed at:  [address] [facility] [address], [place], [place]  [id number] Lab Director: Chinita Pester MT, Phone:  [phone]    Studies/Results:   Results for orders placed during the hospital encounter of 09/12/20  CT Renal Stone Study  Narrative CLINICAL DATA:  Right flank pain.  EXAM: CT ABDOMEN AND PELVIS WITHOUT CONTRAST  TECHNIQUE: Multidetector CT imaging of the abdomen and pelvis was performed following the standard protocol without IV contrast.  COMPARISON:  CT February 02, 2019.  FINDINGS: Lower chest: No acute abnormality.  Hepatobiliary: No focal liver abnormality is seen. Similar appearance of multiple gallstones. No gallbladder wall thickening or inflammatory change. Mild hepatomegaly.  Pancreas: Unremarkable. No pancreatic ductal dilatation or surrounding inflammatory changes.  Spleen: Normal in size without focal abnormality.  Adrenals/Urinary Tract: Normal adrenal glands. There are multiple bilateral renal calculi. The largest is on the right and measures approximately 5 mm and is interpolar location. No hydronephrosis. No ureteral calculi identified. No ureteral nephrosis.  Stomach/Bowel: Stomach is within normal limits. Appendix appears normal. No evidence of bowel wall thickening, distention, or inflammatory changes. Stool throughout the colon.  Vascular/Lymphatic: Aortic atherosclerosis. No enlarged abdominal or pelvic lymph nodes.  Reproductive: Uterus and bilateral adnexa are unremarkable. IUD in place.  Other: No significant free fluid.  Musculoskeletal: Multilevel degenerative disc disease.  IMPRESSION: 1. Bilateral nephrolithiasis without evidence of hydronephrosis or ureteral calculus. 2. Cholelithiasis. 3. Moderate colonic stool burden.   Electronically Signed By: Feliberto Harts MD On: 09/12/2020 14:41    Assessment & Plan: Bilateral renal calculi.   Her stones appear stable.   I will get a KUB to see if they are visible and will have her return in 6 months with a  KUB.  She is going to take apple cider vinegar which is high in citrate.     No orders of the defined types were placed in this encounter.    Orders Placed This Encounter  Procedures  . DG Abd 1 View    Order Specific Question:   Reason for Exam (SYMPTOM  OR DIAGNOSIS REQUIRED)    Answer:   renal stones    Order Specific Question:   Preferred imaging location?    Answer:   [facility]    Order Specific Question:   Radiology Contrast Protocol - do NOT remove file path    Answer:   [email or web address]    Order Specific Question:   Is patient pregnant?    Answer:   No  . DG Abd 1 View    Standing Status:   Future    Standing Expiration Date:   11/16/2021    Order Specific Question:   Reason for Exam (SYMPTOM  OR DIAGNOSIS REQUIRED)    Answer:   renal stones    Order Specific Question:   Preferred imaging location?    Answer:   [facility]    Order Specific Question:   Radiology Contrast Protocol - do NOT remove file path    Answer:   [email or web address]    Order Specific Question:   Is patient pregnant?    Answer:   No  . Urinalysis, Routine w reflex microscopic      Return in about 6 months (around 05/16/2021) for with KUB.   CC: Freddy Finner, NP      Bjorn Pippin 11/16/2020

## 2020-11-16 ENCOUNTER — Other Ambulatory Visit: Payer: Self-pay

## 2020-11-16 ENCOUNTER — Ambulatory Visit (INDEPENDENT_AMBULATORY_CARE_PROVIDER_SITE_OTHER): Payer: 59 | Admitting: Urology

## 2020-11-16 ENCOUNTER — Encounter: Payer: Self-pay | Admitting: Urology

## 2020-11-16 ENCOUNTER — Ambulatory Visit (HOSPITAL_COMMUNITY)
Admission: RE | Admit: 2020-11-16 | Discharge: 2020-11-16 | Disposition: A | Discharge status: Home / Self Care | Payer: 59 | Source: Ambulatory Visit | Attending: Urology | Admitting: Urology

## 2020-11-16 VITALS — BP 145/77 | HR 91 | Temp 98.7°F | Ht 62.0 in | Wt 240.0 lb

## 2020-11-16 DIAGNOSIS — N200 Calculus of kidney: Secondary | ICD-10-CM | POA: Insufficient documentation

## 2020-11-16 DIAGNOSIS — N201 Calculus of ureter: Secondary | ICD-10-CM

## 2020-11-16 LAB — URINALYSIS, ROUTINE W REFLEX MICROSCOPIC
Bilirubin, UA: NEGATIVE
Glucose, UA: NEGATIVE
Ketones, UA: NEGATIVE
Leukocytes,UA: NEGATIVE
Nitrite, UA: NEGATIVE
Protein,UA: NEGATIVE
RBC, UA: NEGATIVE
Specific Gravity, UA: 1.015 (ref 1.005–1.030)
Urobilinogen, Ur: 0.2 mg/dL (ref 0.2–1.0)
pH, UA: 6 (ref 5.0–7.5)

## 2020-11-16 NOTE — Progress Notes (Signed)

## 2020-11-17 ENCOUNTER — Telehealth: Payer: Self-pay

## 2020-11-17 NOTE — Telephone Encounter (Signed)
-----   Message from Bjorn Pippin, MD sent at 11/17/2020  3:26 PM EST ----- Small stable lower pole renal stones that don't need treatment at this time.  F/u as planned.

## 2020-11-17 NOTE — Telephone Encounter (Signed)
Patient called with no answer. Message left to call office. Letter sent with KUB results.

## 2020-11-17 NOTE — Progress Notes (Signed)
Letter sent via mail 

## 2020-11-20 ENCOUNTER — Telehealth: Payer: Self-pay | Admitting: Urology

## 2020-11-20 NOTE — Telephone Encounter (Signed)
Pt called wanting to get results of the x-ray she had done.

## 2020-11-20 NOTE — Telephone Encounter (Signed)
Attempted to call patient back- no answer. Left message.   On 2/11 another nurse attempted to reach patient as well.

## 2020-11-21 NOTE — Telephone Encounter (Signed)
Can you call this patient please? Dr. Annabell Howells has sent a message on her. There should be a telephone encounter. Pt needs to have KUB prior to her appt as well.

## 2020-11-22 NOTE — Telephone Encounter (Signed)
Attempted to call patient again. No answer. Unable to leave message.

## 2020-11-22 NOTE — Telephone Encounter (Signed)
See below

## 2020-11-29 ENCOUNTER — Encounter: Payer: Self-pay | Admitting: Internal Medicine

## 2020-11-29 ENCOUNTER — Ambulatory Visit: Payer: 59 | Admitting: Internal Medicine

## 2020-11-29 ENCOUNTER — Other Ambulatory Visit: Payer: Self-pay

## 2020-11-29 ENCOUNTER — Ambulatory Visit (INDEPENDENT_AMBULATORY_CARE_PROVIDER_SITE_OTHER): Payer: 59 | Admitting: Internal Medicine

## 2020-11-29 VITALS — BP 145/84 | HR 72 | Temp 98.7°F | Resp 18 | Ht 62.0 in | Wt 239.1 lb

## 2020-11-29 DIAGNOSIS — H6502 Acute serous otitis media, left ear: Secondary | ICD-10-CM | POA: Diagnosis not present

## 2020-11-29 MED ORDER — AMOXICILLIN-POT CLAVULANATE 875-125 MG PO TABS: 1.0000 | ORAL_TABLET | Freq: Two times a day (BID) | ORAL | 0 refills | Status: DC

## 2020-11-29 NOTE — Assessment & Plan Note (Signed)
Augmentin prescribed Ibuprofen PRN for pain If recurrent episode, would refer to ENT

## 2020-11-29 NOTE — Progress Notes (Signed)
Acute Office Visit  Subjective:    Patient ID: Sarah Davila, female    DOB: [birth year], 60 y.o.   MRN: [id number]  Chief Complaint  Patient presents with   Ear Pain    Pt has been having ear pain in right ear 11-26-20 its a constant pain she was taking tylenol and ibuprofen but this is not helping anymore     HPI Patient is in today for evaluation of right ear pain for the last 3 days. She currently denies any discharge. She has had 2 episodes of otitis media in the last 6 months. She denies any fever or chills currently. She was concerned about abscess as she had it after a dental procedure.  Past Medical History:  Diagnosis Date   Coronary artery disease    Hypertension    Kidney stone    Renal disorder    Sepsis (HCC) 2015   urinary    Past Surgical History:  Procedure Laterality Date   CYSTOSCOPY W/ URETERAL STENT PLACEMENT Bilateral 02/04/2019   Procedure: CYSTOSCOPY WITH RETROGRADE PYELOGRAM/URETERAL STENT PLACEMENT;  Surgeon: Bjorn Pippin, MD;  Location: [facility];  Service: Urology;  Laterality: Bilateral;   CYSTOSCOPY/URETEROSCOPY/HOLMIUM LASER/STENT PLACEMENT Bilateral 02/16/2019   Procedure: CYSTOSCOPY BILATERAL URETEROSCOPY WITH HOLMIUM LASER AND STENTS PLACEMENT;  Surgeon: Bjorn Pippin, MD;  Location: [facility];  Service: Urology;  Laterality: Bilateral;    Family History  Problem Relation Age of Onset   Hepatitis Mother    Cirrhosis Mother    Diabetes Father    Cancer Other     Social History   Socioeconomic History   Marital status: Divorced    Spouse name: Not on file   Number of children: Not on file   Years of education: Not on file   Highest education level: Not on file  Occupational History   Not on file  Tobacco Use   Smoking status: Never Smoker   Smokeless tobacco: Never Used  Vaping Use   Vaping Use: Never used  Substance and Sexual Activity   Alcohol use: Never   Drug use: Never   Sexual activity: Yes    Birth  control/protection: I.U.D.  Other Topics Concern   Not on file  Social History Narrative   Not on file   Social Determinants of Health   Financial Resource Strain: Low Risk    Difficulty of Paying Living Expenses: Not hard at all  Food Insecurity: No Food Insecurity   Worried About Programme researcher, broadcasting/film/video in the Last Year: Never true   Ran Out of Food in the Last Year: Never true  Transportation Needs: No Transportation Needs   Lack of Transportation (Medical): No   Lack of Transportation (Non-Medical): No  Physical Activity: Sufficiently Active   Days of Exercise per Week: 7 days   Minutes of Exercise per Session: 60 min  Stress: No Stress Concern Present   Feeling of Stress : Not at all  Social Connections: Socially Isolated   Frequency of Communication with Friends and Family: More than three times a week   Frequency of Social Gatherings with Friends and Family: Never   Attends Religious Services: Never   Database administrator or Organizations: No   Attends Engineer, structural: Never   Marital Status: Divorced  Catering manager Violence: Not At Risk   Fear of Current or Ex-Partner: No   Emotionally Abused: No   Physically Abused: No   Sexually Abused: No    Outpatient Medications Prior  to Visit  Medication Sig Dispense Refill   aspirin EC 81 MG tablet Take 81 mg by mouth daily.     azithromycin (ZITHROMAX) 250 MG tablet Dispense as a z-pack 6 tablet 0   Black Cohosh 200 MG CAPS Take 2 capsules by mouth daily.      cetirizine (ZYRTEC) 10 MG tablet Take 10 mg by mouth daily.     docusate sodium (COLACE) 100 MG capsule Take 100 mg by mouth daily.      fexofenadine (ALLEGRA) 180 MG tablet Take 180 mg by mouth daily.     fluticasone (FLONASE) 50 MCG/ACT nasal spray Place 2 sprays into both nostrils in the morning and at bedtime. Use 2 sprays in each nostril BID for a week, then can cut back to 1 spray BID PRN for congestion/ear fluid 16 g 6    ibuprofen (ADVIL) 800 MG tablet Take 1 tablet (800 mg total) by mouth every 8 (eight) hours as needed. 30 tablet 0   lisinopril (ZESTRIL) 20 MG tablet Take 1 tablet (20 mg total) by mouth daily. 90 tablet 0   Vitamin D, Ergocalciferol, (DRISDOL) 1.25 MG (50000 UNIT) CAPS capsule Take 1 capsule (50,000 Units total) by mouth every 7 (seven) days. 12 capsule 1   No facility-administered medications prior to visit.    Allergies  Allergen Reactions   Ciprofloxacin Other (See Comments)    Sever hot flashes    Review of Systems  Constitutional: Negative for chills and fever.  HENT: Positive for ear pain. Negative for congestion, hearing loss, sinus pressure, sinus pain and sore throat.   Eyes: Negative for pain and discharge.  Respiratory: Negative for cough and shortness of breath.   Cardiovascular: Negative for chest pain and palpitations.  Gastrointestinal: Negative for abdominal pain, constipation, diarrhea, nausea and vomiting.  Musculoskeletal: Negative for neck pain and neck stiffness.  Neurological: Negative for dizziness and weakness.       Objective:    Physical Exam Vitals reviewed.  Constitutional:      General: She is not in acute distress.    Appearance: She is not diaphoretic.  HENT:     Head: Normocephalic and atraumatic.     Right Ear: Hearing, tympanic membrane, ear canal and external ear normal. There is no impacted cerumen.     Left Ear: Hearing normal. A middle ear effusion is present. There is no impacted cerumen. Tympanic membrane is erythematous.     Nose: Nose normal.     Mouth/Throat:     Mouth: Mucous membranes are moist.  Eyes:     General: No scleral icterus.    Extraocular Movements: Extraocular movements intact.     Pupils: Pupils are equal, round, and reactive to light.  Cardiovascular:     Rate and Rhythm: Normal rate and regular rhythm.  Pulmonary:     Breath sounds: Normal breath sounds. No wheezing or rales.  Musculoskeletal:      Cervical back: Neck supple. No tenderness.     Right lower leg: No edema.     Left lower leg: No edema.  Skin:    General: Skin is warm.     Findings: No rash.  Neurological:     General: No focal deficit present.     Mental Status: She is alert and oriented to person, place, and time.  Psychiatric:        Mood and Affect: Mood normal.        Behavior: Behavior normal.     BP Marland Kitchen)  145/84 (BP Location: Right Arm, Patient Position: Sitting, Cuff Size: Normal)    Pulse 72    Temp 98.7 F (37.1 C) (Oral)    Resp 18    Ht 5\' 2"  (1.575 m)    Wt 239 lb 1.9 oz (108.5 kg)    LMP 02/15/2014 (Approximate) Comment: 5 years ago   SpO2 98%    BMI 43.74 kg/m  Wt Readings from Last 3 Encounters:  11/29/20 239 lb 1.9 oz (108.5 kg)  11/16/20 240 lb (108.9 kg)  09/21/20 240 lb (108.9 kg)    Health Maintenance Due  Topic Date Due   Hepatitis C Screening  Never done   HIV Screening  Never done   PAP SMEAR-Modifier  Never done   COVID-19 Vaccine (3 - Booster for Moderna series) 07/27/2020    There are no preventive care reminders to display for this patient.   Lab Results  Component Value Date   TSH 1.470 08/30/2020   Lab Results  Component Value Date   WBC 10.7 (H) 09/12/2020   HGB 13.5 09/12/2020   HCT 43.8 09/12/2020   MCV 90.7 09/12/2020   PLT 390 09/12/2020   Lab Results  Component Value Date   NA 141 09/12/2020   K 5.3 (H) 09/12/2020   CO2 24 09/12/2020   GLUCOSE 94 09/12/2020   BUN 21 (H) 09/12/2020   CREATININE 0.75 09/12/2020   BILITOT 0.3 08/30/2020   ALKPHOS 146 (H) 08/30/2020   AST 15 08/30/2020   ALT 16 08/30/2020   PROT 7.1 08/30/2020   ALBUMIN 4.4 08/30/2020   CALCIUM 9.9 09/12/2020   ANIONGAP 12 09/12/2020   Lab Results  Component Value Date   CHOL 244 (H) 08/30/2020   Lab Results  Component Value Date   HDL 79 08/30/2020   Lab Results  Component Value Date   LDLCALC 143 (H) 08/30/2020   Lab Results  Component Value Date   TRIG 124 08/30/2020    Lab Results  Component Value Date   CHOLHDL 3.1 08/30/2020   Lab Results  Component Value Date   HGBA1C 5.7 (H) 08/30/2020       Assessment & Plan:   Problem List Items Addressed This Visit      Nervous and Auditory   Otitis media - Primary    Augmentin prescribed Ibuprofen PRN for pain If recurrent episode, would refer to ENT      Relevant Medications   amoxicillin-clavulanate (AUGMENTIN) 875-125 MG tablet       Meds ordered this encounter  Medications   amoxicillin-clavulanate (AUGMENTIN) 875-125 MG tablet    Sig: Take 1 tablet by mouth 2 (two) times daily.    Dispense:  20 tablet    Refill:  0     Alonzo Owczarzak 09/01/2020, MD

## 2020-11-29 NOTE — Patient Instructions (Signed)
Otitis Media, Adult  Otitis media is a condition in which the middle ear is red and swollen (inflamed) and full of fluid. The middle ear is the part of the ear that contains bones for hearing as well as air that helps send sounds to the brain. The condition usually goes away on its own. What are the causes? This condition is caused by a blockage in the eustachian tube. The eustachian tube connects the middle ear to the back of the nose. It normally allows air into the middle ear. The blockage is caused by fluid or swelling. Problems that can cause blockage include:  A cold or infection that affects the nose, mouth, or throat.  Allergies.  An irritant, such as tobacco smoke.  Adenoids that have become large. The adenoids are soft tissue located in the back of the throat, behind the nose and the roof of the mouth.  Growth or swelling in the upper part of the throat, just behind the nose (nasopharynx).  Damage to the ear caused by change in pressure. This is called barotrauma. What are the signs or symptoms? Symptoms of this condition include:  Ear pain.  Fever.  Problems with hearing.  Being tired.  Fluid leaking from the ear.  Ringing in the ear. How is this treated? This condition can go away on its own within 3-5 days. But if the condition is caused by bacteria or does not go away on its own, or if it keeps coming back, your doctor may:  Give you antibiotic medicines.  Give you medicines for pain. Follow these instructions at home:  Take over-the-counter and prescription medicines only as told by your doctor.  If you were prescribed an antibiotic medicine, take it as told by your doctor. Do not stop taking the antibiotic even if you start to feel better.  Keep all follow-up visits as told by your doctor. This is important. Contact a doctor if:  You have bleeding from your nose.  There is a lump on your neck.  You are not feeling better in 5 days.  You feel worse  instead of better. Get help right away if:  You have pain that is not helped with medicine.  You have swelling, redness, or pain around your ear.  You get a stiff neck.  You cannot move part of your face (paralysis).  You notice that the bone behind your ear hurts when you touch it.  You get a very bad headache. Summary  Otitis media means that the middle ear is red, swollen, and full of fluid.  This condition usually goes away on its own.  If the problem does not go away, treatment may be needed. You may be given medicines to treat the infection or to treat your pain.  If you were prescribed an antibiotic medicine, take it as told by your doctor. Do not stop taking the antibiotic even if you start to feel better.  Keep all follow-up visits as told by your doctor. This is important. This information is not intended to replace advice given to you by your health care provider. Make sure you discuss any questions you have with your health care provider. Document Revised: 08/26/2019 Document Reviewed: 08/26/2019 Elsevier Patient Education  2021 Elsevier Inc.  

## 2020-12-07 ENCOUNTER — Ambulatory Visit: Payer: 59 | Admitting: Urology

## 2021-01-30 ENCOUNTER — Ambulatory Visit: Payer: 59 | Admitting: Orthopaedic Surgery

## 2021-03-07 ENCOUNTER — Telehealth: Payer: Self-pay

## 2021-03-07 ENCOUNTER — Other Ambulatory Visit: Payer: Self-pay

## 2021-03-07 DIAGNOSIS — I10 Essential (primary) hypertension: Secondary | ICD-10-CM

## 2021-03-07 MED ORDER — LISINOPRIL 20 MG PO TABS: 20.0000 mg | ORAL_TABLET | Freq: Every day | ORAL | 0 refills | Status: DC

## 2021-03-07 NOTE — Telephone Encounter (Signed)
Patient need refill lisinopril (ZESTRIL) 20 MG tablet   Pharmacy: [facility]

## 2021-03-07 NOTE — Telephone Encounter (Signed)
Refill sent.

## 2021-04-05 ENCOUNTER — Other Ambulatory Visit: Payer: Self-pay

## 2021-04-05 ENCOUNTER — Telehealth: Payer: Self-pay

## 2021-04-05 ENCOUNTER — Ambulatory Visit (HOSPITAL_COMMUNITY)
Admission: RE | Admit: 2021-04-05 | Discharge: 2021-04-05 | Disposition: A | Discharge status: Home / Self Care | Payer: 59 | Source: Ambulatory Visit | Attending: Urology | Admitting: Urology

## 2021-04-05 DIAGNOSIS — N200 Calculus of kidney: Secondary | ICD-10-CM

## 2021-04-05 NOTE — Telephone Encounter (Signed)
Order placed. Patient called and made aware. 

## 2021-04-10 ENCOUNTER — Telehealth: Payer: Self-pay

## 2021-04-10 NOTE — Telephone Encounter (Signed)
-----   Message from Bjorn Pippin, MD sent at 04/10/2021  8:37 AM EDT ----- Stable small renal stones.  ----- Message ----- From: Gustavus Messing, LPN Sent: 12/12/962   8:24 AM EDT To: Bjorn Pippin, MD  Please review

## 2021-04-10 NOTE — Telephone Encounter (Signed)
Patient called and made aware.

## 2021-05-17 ENCOUNTER — Ambulatory Visit: Payer: 59 | Admitting: Urology

## 2021-05-24 ENCOUNTER — Ambulatory Visit: Payer: 59 | Admitting: Nurse Practitioner

## 2021-05-24 ENCOUNTER — Ambulatory Visit: Payer: 59 | Admitting: Family Medicine

## 2021-05-31 ENCOUNTER — Ambulatory Visit: Payer: 59 | Admitting: Urology

## 2021-05-31 DIAGNOSIS — N200 Calculus of kidney: Secondary | ICD-10-CM

## 2021-06-19 ENCOUNTER — Other Ambulatory Visit: Payer: Self-pay

## 2021-06-19 ENCOUNTER — Telehealth: Payer: Self-pay | Admitting: Nurse Practitioner

## 2021-06-19 DIAGNOSIS — I10 Essential (primary) hypertension: Secondary | ICD-10-CM

## 2021-06-19 MED ORDER — LISINOPRIL 20 MG PO TABS: 20.0000 mg | ORAL_TABLET | Freq: Every day | ORAL | 0 refills | Status: DC

## 2021-06-19 NOTE — Telephone Encounter (Signed)
Please send over Lisinopril to the pharmacy

## 2021-06-19 NOTE — Telephone Encounter (Signed)
 supply

## 2021-06-19 NOTE — Telephone Encounter (Signed)
Rx refilled.

## 2021-07-10 ENCOUNTER — Ambulatory Visit: Payer: 59 | Admitting: Nurse Practitioner

## 2021-08-29 ENCOUNTER — Ambulatory Visit: Payer: 59 | Admitting: Nurse Practitioner

## 2021-09-03 ENCOUNTER — Ambulatory Visit (INDEPENDENT_AMBULATORY_CARE_PROVIDER_SITE_OTHER): Payer: 59 | Admitting: Nurse Practitioner

## 2021-09-03 ENCOUNTER — Ambulatory Visit: Payer: 59 | Admitting: Nurse Practitioner

## 2021-09-03 ENCOUNTER — Encounter: Payer: Self-pay | Admitting: Nurse Practitioner

## 2021-09-03 ENCOUNTER — Other Ambulatory Visit: Payer: Self-pay

## 2021-09-03 VITALS — BP 135/80 | HR 97 | Ht 62.0 in | Wt 251.0 lb

## 2021-09-03 DIAGNOSIS — Z139 Encounter for screening, unspecified: Secondary | ICD-10-CM

## 2021-09-03 DIAGNOSIS — R0781 Pleurodynia: Secondary | ICD-10-CM | POA: Diagnosis not present

## 2021-09-03 DIAGNOSIS — Z23 Encounter for immunization: Secondary | ICD-10-CM

## 2021-09-03 DIAGNOSIS — I10 Essential (primary) hypertension: Secondary | ICD-10-CM | POA: Diagnosis not present

## 2021-09-03 DIAGNOSIS — Z6841 Body Mass Index (BMI) 40.0 and over, adult: Secondary | ICD-10-CM

## 2021-09-03 NOTE — Assessment & Plan Note (Addendum)
BP Readings from Last 3 Encounters:  09/03/21 135/80  11/29/20 (!) 145/84  11/16/20 (!) 145/77  continue lisinopril 20mg  , avoid salty food, engage in regular vigorous exercise 30 minutes 5 days a week.

## 2021-09-03 NOTE — Progress Notes (Signed)
   Sarah Davila     MRN: [id number]      DOB: [birth year]   HPI Sarah Davila is here for acute visit. The PT denies any adverse reactions to current medications since the last visit.   PT [name] on Nov 16th and tripped on a hose , fell on her right side hitting the concrete floor.Has  right rib cage pain, 6/10 when touched, takes OTC tylenol as needed. Pain is getting better, she can not lay on her right side., stretching for something makes her pain worse. No pain with breathing, denies  SOB, fever, chills, body ache, numbness , tingling , no bleeding. CP  ROS Denies recent fever or chills. Denies sinus pressure, nasal congestion, ear pain or sore throat. Denies chest congestion, productive cough or wheezing. Denies chest pains, palpitations and leg swelling Denies abdominal pain, nausea, vomiting,diarrhea or constipation.   Denies dysuria, frequency, hesitancy or incontinence.  No swelling and has pain when stretching her hands. Denies headaches, seizures, numbness, or tingling. Denies depression, anxiety or insomnia. Denies skin break down or rash.   PE  BP 135/80 (BP Location: Right Arm, Patient Position: Sitting, Cuff Size: Large)   Pulse 97   Ht 5\' 2"  (1.575 m)   Wt 251 lb (113.9 kg)   LMP 02/15/2014 (Approximate) Comment: 5 years ago  SpO2 96%   BMI 45.91 kg/m   Patient alert and oriented and in no cardiopulmonary distress.  HEENT: No facial asymmetry, EOMI,     Neck supple .  Chest: Clear to auscultation bilaterally.  CVS: S1, S2 no murmurs, no S3.Regular rate.  ABD: Soft non tender.   Ext: No edema  MS: Adequate hips and knees.,Tenderness with palpation of  the anterior and lateral right rib cage area, tenderness of right rib cage with movement of right shoulder.  Skin: Intact, no ulcerations or rash noted.  Psych: Good eye contact, normal affect. Memory intact not anxious or depressed appearing.  CNS: CN 2-12 intact, power,  normal throughout.no focal  deficits noted.   Assessment & Plan

## 2021-09-03 NOTE — Assessment & Plan Note (Signed)
Xray ordered. Use tylenol as needed for pain.

## 2021-09-03 NOTE — Assessment & Plan Note (Signed)
30 minutes of vigorous exercise 5 days a week and importance of portion controlled, low fat diet discussed.  Wt Readings from Last 3 Encounters:  09/03/21 251 lb (113.9 kg)  11/29/20 239 lb 1.9 oz (108.5 kg)  11/16/20 240 lb (108.9 kg)

## 2021-09-03 NOTE — Assessment & Plan Note (Signed)
Flu vaccine and shingles vaccine today. PT up to date on her COVID vaccine.

## 2021-09-03 NOTE — Patient Instructions (Signed)
Please get your xray done, get your fasting labs done before your next visit. Use tylenol as needed for your right rib cage pain Flu vaccine and shingles vaccine today.   It is important that you exercise regularly at least 30 minutes 5 times a week.  Think about what you will eat, plan ahead. Choose " clean, green, fresh or frozen" over canned, processed or packaged foods which are more sugary, salty and fatty. 70 to 75% of food eaten should be vegetables and fruit. Three meals at set times with snacks allowed between meals, but they must be fruit or vegetables. Aim to eat over a 12 hour period , example 7 am to 7 pm, and STOP after  your last meal of the day. Drink water,generally about 64 ounces per day, no other drink is as healthy. Fruit juice is best enjoyed in a healthy way, by EATING the fruit.  Thanks for choosing [facility], we consider it a privelige to serve you.

## 2021-09-03 NOTE — Assessment & Plan Note (Signed)
First dose shingles vaccine today.

## 2021-09-04 ENCOUNTER — Ambulatory Visit (HOSPITAL_COMMUNITY)
Admission: RE | Admit: 2021-09-04 | Discharge: 2021-09-04 | Disposition: A | Discharge status: Home / Self Care | Payer: 59 | Source: Ambulatory Visit | Attending: Nurse Practitioner | Admitting: Nurse Practitioner

## 2021-09-04 DIAGNOSIS — R0781 Pleurodynia: Secondary | ICD-10-CM | POA: Insufficient documentation

## 2021-09-05 ENCOUNTER — Other Ambulatory Visit: Payer: Self-pay | Admitting: Nurse Practitioner

## 2021-09-05 DIAGNOSIS — R748 Abnormal levels of other serum enzymes: Secondary | ICD-10-CM

## 2021-09-05 LAB — CMP14+EGFR
ALT: 36 IU/L — ABNORMAL HIGH (ref 0–32)
AST: 33 IU/L (ref 0–40)
Albumin/Globulin Ratio: 1.8 (ref 1.2–2.2)
Albumin: 4.6 g/dL (ref 3.8–4.9)
Alkaline Phosphatase: 174 IU/L — ABNORMAL HIGH (ref 44–121)
BUN/Creatinine Ratio: 24 — ABNORMAL HIGH (ref 9–23)
BUN: 20 mg/dL (ref 6–24)
Bilirubin Total: 0.3 mg/dL (ref 0.0–1.2)
CO2: 20 mmol/L (ref 20–29)
Calcium: 9.8 mg/dL (ref 8.7–10.2)
Chloride: 100 mmol/L (ref 96–106)
Creatinine, Ser: 0.82 mg/dL (ref 0.57–1.00)
Globulin, Total: 2.6 g/dL (ref 1.5–4.5)
Glucose: 97 mg/dL (ref 70–99)
Potassium: 4.4 mmol/L (ref 3.5–5.2)
Sodium: 137 mmol/L (ref 134–144)
Total Protein: 7.2 g/dL (ref 6.0–8.5)
eGFR: 83 mL/min/{1.73_m2} (ref >59)

## 2021-09-05 LAB — CBC
Hematocrit: 44.4 % (ref 34.0–46.6)
Hemoglobin: 14.4 g/dL (ref 11.1–15.9)
MCH: 26.5 pg — ABNORMAL LOW (ref 26.6–33.0)
MCHC: 32.4 g/dL (ref 31.5–35.7)
MCV: 82 fL (ref 79–97)
Platelets: 352 10*3/uL (ref 150–450)
RBC: 5.43 x10E6/uL — ABNORMAL HIGH (ref 3.77–5.28)
RDW: 14.1 % (ref 11.7–15.4)
WBC: 9.1 10*3/uL (ref 3.4–10.8)

## 2021-09-05 LAB — HIV ANTIBODY (ROUTINE TESTING W REFLEX): HIV Screen 4th Generation wRfx: NONREACTIVE

## 2021-09-05 LAB — LIPID PANEL
Chol/HDL Ratio: 3.5 ratio (ref 0.0–4.4)
Cholesterol, Total: 242 mg/dL — ABNORMAL HIGH (ref 100–199)
HDL: 69 mg/dL (ref >39)
LDL Chol Calc (NIH): 155 mg/dL — ABNORMAL HIGH (ref 0–99)
Triglycerides: 101 mg/dL (ref 0–149)
VLDL Cholesterol Cal: 18 mg/dL (ref 5–40)

## 2021-09-05 LAB — VITAMIN D 25 HYDROXY (VIT D DEFICIENCY, FRACTURES): Vit D, 25-Hydroxy: 18.5 ng/mL — ABNORMAL LOW (ref 30.0–100.0)

## 2021-09-05 LAB — TSH: TSH: 1.84 u[IU]/mL (ref 0.450–4.500)

## 2021-09-05 LAB — HEMOGLOBIN A1C
Est. average glucose Bld gHb Est-mCnc: 120 mg/dL
Hgb A1c MFr Bld: 5.8 % — ABNORMAL HIGH (ref 4.8–5.6)

## 2021-09-05 LAB — HEPATITIS C ANTIBODY: Hep C Virus Ab: 0.4 s/co ratio (ref 0.0–0.9)

## 2021-09-06 ENCOUNTER — Encounter: Payer: Self-pay | Admitting: Internal Medicine

## 2021-09-11 ENCOUNTER — Ambulatory Visit: Payer: 59 | Admitting: Nurse Practitioner

## 2021-09-27 ENCOUNTER — Telehealth: Payer: Self-pay | Admitting: Nurse Practitioner

## 2021-09-27 ENCOUNTER — Other Ambulatory Visit: Payer: Self-pay | Admitting: *Deleted

## 2021-09-27 DIAGNOSIS — I10 Essential (primary) hypertension: Secondary | ICD-10-CM

## 2021-09-27 MED ORDER — LISINOPRIL 20 MG PO TABS: 20.0000 mg | ORAL_TABLET | Freq: Every day | ORAL | 3 refills | Status: DC

## 2021-09-27 NOTE — Telephone Encounter (Signed)
See previous TE, Rx has already been sent into pharmacy

## 2021-09-27 NOTE — Telephone Encounter (Signed)
Rx was sent into [facility] pharmacy °

## 2021-09-27 NOTE — Telephone Encounter (Signed)
Pt called In for refill on   lisinopril (ZESTRIL) 20 MG tablet   [facility] , [place]

## 2021-09-27 NOTE — Telephone Encounter (Signed)
Pt called in for refill on   lisinopril (ZESTRIL) 20 MG tablet   [facility] 

## 2021-10-29 ENCOUNTER — Ambulatory Visit: Payer: Self-pay

## 2021-11-01 ENCOUNTER — Other Ambulatory Visit: Payer: Self-pay

## 2021-11-01 ENCOUNTER — Encounter: Payer: Self-pay | Admitting: Orthopaedic Surgery

## 2021-11-01 ENCOUNTER — Ambulatory Visit (INDEPENDENT_AMBULATORY_CARE_PROVIDER_SITE_OTHER): Payer: 59 | Admitting: Orthopaedic Surgery

## 2021-11-01 ENCOUNTER — Telehealth: Payer: Self-pay

## 2021-11-01 ENCOUNTER — Ambulatory Visit: Payer: 59

## 2021-11-01 VITALS — BP 153/94 | HR 95 | Ht 62.0 in | Wt 242.8 lb

## 2021-11-01 DIAGNOSIS — M25511 Pain in right shoulder: Secondary | ICD-10-CM

## 2021-11-01 DIAGNOSIS — G8929 Other chronic pain: Secondary | ICD-10-CM

## 2021-11-01 MED ORDER — NAPROXEN 500 MG PO TABS: 500.0000 mg | ORAL_TABLET | Freq: Two times a day (BID) | ORAL | 5 refills | Status: DC

## 2021-11-01 NOTE — Telephone Encounter (Signed)
Patient called her pharmacy checking about her prescription and they told her they never received anything from our office. It is in her chart you were going to send it in.  Naprosyn 500 mg  PATIENT USES [facility]

## 2021-11-01 NOTE — Progress Notes (Signed)
I hurt my right shoulder.  She tripped over a hose in mid November 22 and hurt her right lower ribs and the right shoulder.  She had X-rays of the ribs then but not of the shoulder.  The rib pain has resolved but the shoulder pain has gotten worse.  She has pain with extension and over head use.  She has pain at night rolling on the shoulder.  Nothing seems to help.  She takes ibuprofen 800 two to three times a day, uses heat and ice with no help.  She has no swelling, no redness, no numbness.  Right shoulder has full ROM with pain in the extremes except in extension which is very limited.  NV intact. Grips normal.ROM neck is full.  X-rays were done of the right shoulder, reported separately.  Encounter Diagnosis  Name Primary?   Chronic right shoulder pain Yes   PROCEDURE NOTE:  The patient request injection, verbal consent was obtained.  The right shoulder was prepped appropriately after time out was performed.   Sterile technique was observed and injection of 1 cc of DepoMedrol 40mg  with several cc's of plain xylocaine. Anesthesia was provided by ethyl chloride and a 20-gauge needle was used to inject the shoulder area. A posterior approach was used.  The injection was tolerated well.  A band aid dressing was applied.  The patient was advised to apply ice later today and tomorrow to the injection sight as needed.  I will set up OT.  I will begin Naprosyn 500 po bid pc  Return in three weeks.  Call if any problem.  Precautions discussed.  Electronically Signed , MD 1/26/20239:24 AM

## 2021-11-06 ENCOUNTER — Encounter (HOSPITAL_COMMUNITY): Payer: Self-pay

## 2021-11-06 ENCOUNTER — Ambulatory Visit (HOSPITAL_COMMUNITY): Payer: 59 | Attending: Orthopaedic Surgery

## 2021-11-06 ENCOUNTER — Other Ambulatory Visit: Payer: Self-pay

## 2021-11-06 DIAGNOSIS — G8929 Other chronic pain: Secondary | ICD-10-CM | POA: Diagnosis present

## 2021-11-06 DIAGNOSIS — M25611 Stiffness of right shoulder, not elsewhere classified: Secondary | ICD-10-CM | POA: Insufficient documentation

## 2021-11-06 DIAGNOSIS — R29898 Other symptoms and signs involving the musculoskeletal system: Secondary | ICD-10-CM | POA: Insufficient documentation

## 2021-11-06 DIAGNOSIS — M25511 Pain in right shoulder: Secondary | ICD-10-CM | POA: Diagnosis present

## 2021-11-06 NOTE — Patient Instructions (Signed)
Complete the following exercises 2-3 times a day.  Doorway Stretch  Place each hand opposite each other on the doorway. (You can change where you feel the stretch by moving arms higher or lower.) Step through with one foot and bend front knee until a stretch is felt and hold. Step through with the opposite foot on the next rep. Hold for __20-30___ seconds. Repeat __2__times.      Internal Rotation Across Back  Grab the end of a towel with your affected side, palm facing backwards. Grab the towel with your unaffected side and pull your affected hand across your back until you feel a stretch in the front of your shoulder. If you feel pain, pull just to the pain, do not pull through the pain. Hold. Return your affected arm to your side. Try to keep your hand/arm close to your body during the entire movement.     Hold for 20-30 seconds. Complete 2 times.        Posterior Capsule Stretch   Stand or sit, one arm across body so hand rests over opposite shoulder. Gently push on crossed elbow with other hand until stretch is felt in shoulder of crossed arm. Hold _20-30__ seconds.  Repeat _2__ times per session.

## 2021-11-06 NOTE — Therapy (Signed)
[facility] [address] [place], [place], [address] Phone: [phone]   Fax:  [phone]  Occupational Therapy Evaluation  Patient Details  Name: Sarah Davila MRN: [id number] Date of Birth: 07-13-1961 Referring Provider (OT): Darreld Mclean, MD   Encounter Date: 11/06/2021   OT End of Session - 11/06/21 1621     Visit Number 1    Number of Visits 4    Date for OT Re-Evaluation 12/04/21    Authorization Type [employer]    Authorization Time Period $10 copay. no authorization needed. 30 visit limit combined OT/PT/Chiro with 0 used.    Authorization - Visit Number 1    Authorization - Number of Visits 30    OT Start Time 1515    OT Stop Time 1610    OT Time Calculation (min) 55 min    Activity Tolerance Patient tolerated treatment well;Patient limited by pain    Behavior During Therapy WFL for tasks assessed/performed             Past Medical History:  Diagnosis Date   Coronary artery disease    Hypertension    Kidney stone    Renal disorder    Sepsis ([facility]) 2015   urinary    Past Surgical History:  Procedure Laterality Date   CYSTOSCOPY W/ URETERAL STENT PLACEMENT Bilateral 02/04/2019   Procedure: CYSTOSCOPY WITH RETROGRADE PYELOGRAM/URETERAL STENT PLACEMENT;  Surgeon: Bjorn Pippin, MD;  Location: [facility];  Service: Urology;  Laterality: Bilateral;   CYSTOSCOPY/URETEROSCOPY/HOLMIUM LASER/STENT PLACEMENT Bilateral 02/16/2019   Procedure: CYSTOSCOPY BILATERAL URETEROSCOPY WITH HOLMIUM LASER AND STENTS PLACEMENT;  Surgeon: Bjorn Pippin, MD;  Location: [facility];  Service: Urology;  Laterality: Bilateral;    There were no vitals filed for this visit.   Subjective Assessment - 11/06/21 1536     Subjective  S: i got an injection last week and I can move it now and the pain is so much less.    Pertinent History Patient is a 61 y/o female S/P chronic right shoulder pain which has been ongoing since initial fall in  September 2022. She then experienced a more severe fall when tripping over a garden hose on Novemeber 2022. No X-ray was completed until recently which showed small calcium deposits within the shoulder joint. Dr. Hilda Lias provided an injection to the shoulder and has referred patient to occupational therapy for evaluation and treatment. Patient reports that since the shoulder injection, she is able to move her shoulder more and has less pain.    Patient Stated Goals To be able to use her right arm normally.    Currently in Pain? No/denies   Has not experienced pain since receiving shoulder injection last week.   Pain Score 0-No pain    Aggravating Factors  sleeping on it               [facility] OT Assessment - 11/06/21 1517       Assessment   Medical Diagnosis chronic right shoulder pain    Referring Provider (OT) Darreld Mclean, MD    Onset Date/Surgical Date 08/07/21    Hand Dominance Right    Next MD Visit 11/22/21    Prior Therapy None      Precautions   Precautions None      Restrictions   Weight Bearing Restrictions No      Balance Screen   Has the patient fallen in the past 6 months Yes    How many times? 2  Home  Environment   Family/patient expects to be discharged to: Private residence      Prior Function   Level of Independence Independent    Vocation Self employed    Psychologist, counselling - Part time      ADL   ADL comments Difficulty reaching behind her back, pulling her pants up over her hips.      Mobility   Mobility Status Independent      Written Expression   Dominant Hand Right      Vision - History   Baseline Vision No visual deficits      Observation/Other Assessments   Focus on Therapeutic Outcomes (FOTO)  61/100      ROM / Strength   AROM / PROM / Strength AROM;Strength      Palpation   Palpation comment max tenderness noted along the right bicep tendon and the infraspinatus tendon at the superior border of the scapula.       AROM   Overall AROM Comments Assessed seated. IR/er abducted    AROM Assessment Site Shoulder    Right/Left Shoulder Right    Right Shoulder Flexion 160 Degrees    Right Shoulder ABduction 180 Degrees    Right Shoulder Internal Rotation 15 Degrees   OT supporting elbow   Right Shoulder External Rotation 65 Degrees      Strength   Overall Strength Comments Assessed seated. IR/er adducted    Strength Assessment Site Shoulder    Right/Left Shoulder Right    Right Shoulder Flexion 5/5    Right Shoulder ABduction 5/5    Right Shoulder Internal Rotation 5/5    Right Shoulder External Rotation 4+/5                              OT Education - 11/06/21 1614     Education Details shoulder stretches    Person(s) Educated Patient    Methods Explanation;Demonstration;Verbal cues;Handout    Comprehension Returned demonstration;Verbalized understanding              OT Short Term Goals - 11/06/21 1626       OT SHORT TERM GOAL #1   Title Patient will be educated and independent with HEP in order to increase functional use of her right UE and be able to complete required lifting and reaching tasks with less difficulty.    Time 4    Period Weeks    Status New    Target Date 12/04/21      OT SHORT TERM GOAL #2   Title Patient will increase her RUE internal rotation A/ROM to [facility] in order to reach behind her back when attempting to wash or complete lower body dressing tasks.    Time 4    Period Weeks    Status New      OT SHORT TERM GOAL #3   Title Patient will decrease her RUE shoulder fascial restrictions to minimal amount in order to increase the functional mobility needed to reach behind her back.    Time 4    Period Weeks    Status New      OT SHORT TERM GOAL #4   Title Patient will report a decrease in pain level of approximately 2/10 or less when completing reaching tasks such as reaching behind her back.    Time 4    Period Weeks    Status New  Plan - 11/06/21 1623     Clinical Impression Statement A: Patient is a 61 y/o female S/P right shoulder pain causing increased fascial restrictions and decreased ROM resulting in difficulty completing daily tasks using her RUE as her dominant extremity.    OT Occupational Profile and History Detailed Assessment- Review of Records and additional review of physical, cognitive, psychosocial history related to current functional performance    Occupational performance deficits (Please refer to evaluation for details): ADL's;Rest and Sleep;Work    Marketing executive / Function / Physical Skills ADL;Pain;ROM;Mobility;Fascial restriction;UE functional use    Rehab Potential Good    Clinical Decision Making Limited treatment options, no task modification necessary    Comorbidities Affecting Occupational Performance: May have comorbidities impacting occupational performance    Modification or Assistance to Complete Evaluation  Min-Moderate modification of tasks or assist with assess necessary to complete eval    OT Frequency 1x / week    OT Duration 4 weeks    OT Treatment/Interventions Self-care/ADL training;Moist Heat;Therapeutic activities;Ultrasound;Therapeutic exercise;Passive range of motion;Neuromuscular education;Cryotherapy;Electrical Stimulation;Manual Therapy;Patient/family education    Plan P: Patient will benefit from skilled OT services to increase functional use of her RUE while completing required daily tasks at home and in work environment with less difficulty. Treatment plan: Gentle myofascial release and manual stretching (focusing on internal rotation). A/ROM, scapular strengthening. Modalities PRN.    OT Home Exercise Plan eval: shoulder stretches    Consulted and Agree with Plan of Care Patient             Patient will benefit from skilled therapeutic intervention in order to improve the following deficits and impairments:   Body Structure / Function /  Physical Skills: ADL, Pain, ROM, Mobility, Fascial restriction, UE functional use       Visit Diagnosis: Other symptoms and signs involving the musculoskeletal system - Plan: Ot plan of care cert/re-cert  Chronic right shoulder pain - Plan: Ot plan of care cert/re-cert  Stiffness of right shoulder, not elsewhere classified - Plan: Ot plan of care cert/re-cert    Problem List Patient Active Problem List   Diagnosis Date Noted   Rib pain on right side 09/03/2021   Need for shingles vaccine 09/03/2021   Annual visit for general adult medical examination with abnormal findings 08/30/2020   Essential hypertension 08/30/2020   Seasonal allergies 08/30/2020   History of cervical dysplasia 08/09/2020   Encounter for screening mammogram for malignant neoplasm of breast 08/09/2020   Acute pain of left knee 07/11/2020   Indigestion 07/06/2020   Need for immunization against influenza 07/06/2020   Immunization due 07/06/2020   Tympanic membrane inflammation 07/06/2020   Otitis media 05/26/2020   Morbid obesity with BMI of 45.0-49.9, adult ([place]) 09/09/2019   Insomnia 05/18/2019   Ailene Ravel, OTR/L,CBIS  [phone]  11/06/2021, 4:30 PM  [facility] [address] [place], [place], [address] Phone: [phone]   Fax:  [phone]  Name: Sarah Davila MRN: [id number] Date of Birth: 1961-02-19

## 2021-11-08 ENCOUNTER — Ambulatory Visit: Payer: 59 | Admitting: Orthopaedic Surgery

## 2021-11-21 ENCOUNTER — Encounter (HOSPITAL_COMMUNITY): Payer: 59

## 2021-11-22 ENCOUNTER — Ambulatory Visit: Payer: 59 | Admitting: Orthopaedic Surgery

## 2021-11-27 ENCOUNTER — Ambulatory Visit (HOSPITAL_COMMUNITY): Payer: 59

## 2021-12-04 ENCOUNTER — Encounter (HOSPITAL_COMMUNITY): Payer: Self-pay

## 2021-12-04 ENCOUNTER — Ambulatory Visit (HOSPITAL_COMMUNITY): Payer: 59 | Attending: Orthopaedic Surgery

## 2021-12-04 ENCOUNTER — Other Ambulatory Visit: Payer: Self-pay

## 2021-12-04 DIAGNOSIS — G8929 Other chronic pain: Secondary | ICD-10-CM

## 2021-12-04 DIAGNOSIS — R29898 Other symptoms and signs involving the musculoskeletal system: Secondary | ICD-10-CM

## 2021-12-04 DIAGNOSIS — M25611 Stiffness of right shoulder, not elsewhere classified: Secondary | ICD-10-CM

## 2021-12-04 DIAGNOSIS — M25511 Pain in right shoulder: Secondary | ICD-10-CM | POA: Diagnosis present

## 2021-12-04 NOTE — Patient Instructions (Signed)
° °  Use tennis ball on the right front shoulder area. (Where it's tender).

## 2021-12-04 NOTE — Therapy (Signed)
[facility] [place], [place], [address] Phone: [phone]   Fax:  [phone]  Occupational Therapy Treatment  Patient Details  Name: Sarah Davila MRN: [id number] Date of Birth: 31-Oct-1960 Referring Provider (OT): Sanjuana Kava, MD   Encounter Date: 12/04/2021   OT End of Session - 12/04/21 1810     Visit Number 2    Number of Visits 4    Authorization Type [employer]    Authorization Time Period $10 copay. no authorization needed. 30 visit limit combined OT/PT/Chiro with 0 used.    Authorization - Visit Number 2    Authorization - Number of Visits 61    OT Start Time 8916   pt arrived late   OT Stop Time 1553    OT Time Calculation (min) 30 min    Activity Tolerance Patient tolerated treatment well    Behavior During Therapy WFL for tasks assessed/performed             Past Medical History:  Diagnosis Date   Coronary artery disease    Hypertension    Kidney stone    Renal disorder    Sepsis ([place]) 2015   urinary    Past Surgical History:  Procedure Laterality Date   CYSTOSCOPY W/ URETERAL STENT PLACEMENT Bilateral 02/04/2019   Procedure: CYSTOSCOPY WITH RETROGRADE PYELOGRAM/URETERAL STENT PLACEMENT;  Surgeon: Irine Seal, MD;  Location: [facility];  Service: Urology;  Laterality: Bilateral;   CYSTOSCOPY/URETEROSCOPY/HOLMIUM LASER/STENT PLACEMENT Bilateral 02/16/2019   Procedure: CYSTOSCOPY BILATERAL URETEROSCOPY WITH HOLMIUM LASER AND STENTS PLACEMENT;  Surgeon: Irine Seal, MD;  Location: [facility];  Service: Urology;  Laterality: Bilateral;    There were no vitals filed for this visit.   Subjective Assessment - 12/04/21 1525     Subjective  S: I've gotten so much better with this motion (ir behind her back)    Currently in Pain? Yes    Pain Score 2     Pain Location Shoulder    Pain Orientation Right    Pain Descriptors / Indicators Sore    Pain Type Chronic pain    Pain Onset Yesterday                 [facility] OT Assessment - 12/04/21 1526       Assessment   Medical Diagnosis chronic right shoulder pain      Precautions   Precautions None      Observation/Other Assessments   Focus on Therapeutic Outcomes (FOTO)  72/100      Palpation   Palpation comment Zero fascial restrictions palpated in right shoulder region. Mild tenderness noted in anterior shoulder region.      AROM   Overall AROM Comments Assessed seated. IR/er abducted    AROM Assessment Site Shoulder    Right/Left Shoulder Right    Right Shoulder Internal Rotation 40 Degrees   previous 15 (continues to need elbow support)   Right Shoulder External Rotation 90 Degrees   previous: 65     Strength   Overall Strength Comments Assessed seated. IR/er adducted    Strength Assessment Site Shoulder    Right/Left Shoulder Right    Right Shoulder External Rotation 5/5   previous: 4+/5                     OT Treatments/Exercises (OP) - 12/04/21 1808       Exercises   Exercises Shoulder      Shoulder Exercises: Supine  Horizontal ABduction PROM;5 reps    External Rotation PROM;5 reps   abducted   Internal Rotation PROM;5 reps   abcuted   Flexion PROM;5 reps    ABduction PROM;5 reps      Manual Therapy   Manual Therapy Myofascial release    Manual therapy comments completed prior to exercises.    Myofascial Release educated and completed self myofascial release and trigger point release to right anterior shoulder region to decrease trigger point and increase ROM..                    OT Education - 12/04/21 1810     Education Details trigger point release to right anterior shoulder using tennis ball, progression of internal rotation stretch with towel (vertical)    Person(s) Educated Patient    Methods Explanation;Demonstration;Handout    Comprehension Verbalized understanding;Returned demonstration              OT Short Term Goals - 12/04/21 1813       OT  SHORT TERM GOAL #1   Title Patient will be educated and independent with HEP in order to increase functional use of her right UE and be able to complete required lifting and reaching tasks with less difficulty.    Time 4    Period Weeks    Status Achieved    Target Date 12/04/21      OT SHORT TERM GOAL #2   Title Patient will increase her RUE internal rotation A/ROM to [facility] in order to reach behind her back when attempting to wash or complete lower body dressing tasks.    Time 4    Period Weeks    Status Achieved      OT SHORT TERM GOAL #3   Title Patient will decrease her RUE shoulder fascial restrictions to minimal amount in order to increase the functional mobility needed to reach behind her back.    Time 4    Period Weeks    Status Achieved      OT SHORT TERM GOAL #4   Title Patient will report a decrease in pain level of approximately 2/10 or less when completing reaching tasks such as reaching behind her back.    Time 4    Period Weeks    Status Achieved                      Plan - 12/04/21 1811     Clinical Impression Statement A: pt reports significant improvement in right UE since evaluation. She has been completing HEP and has more mobility which has improved her internal rotation. Pain continues to be very low or none. Strength is 5/5 for all shoulder ranges. Provided additional stretch progression, self myofascial release technique, and discussed progressing shoulder from this point on. All goals have been met. All education completed.    Body Structure / Function / Physical Skills ADL;Pain;ROM;Mobility;Fascial restriction;UE functional use    Plan P: Discharge from OT services with HEP.    Consulted and Agree with Plan of Care Patient             Patient will benefit from skilled therapeutic intervention in order to improve the following deficits and impairments:   Body Structure / Function / Physical Skills: ADL, Pain, ROM, Mobility, Fascial  restriction, UE functional use       Visit Diagnosis: Other symptoms and signs involving the musculoskeletal system  Chronic right shoulder pain  Stiffness of right shoulder,  not elsewhere classified    Problem List Patient Active Problem List   Diagnosis Date Noted   Rib pain on right side 09/03/2021   Need for shingles vaccine 09/03/2021   Annual visit for general adult medical examination with abnormal findings 08/30/2020   Essential hypertension 08/30/2020   Seasonal allergies 08/30/2020   History of cervical dysplasia 08/09/2020   Encounter for screening mammogram for malignant neoplasm of breast 08/09/2020   Acute pain of left knee 07/11/2020   Indigestion 07/06/2020   Need for immunization against influenza 07/06/2020   Immunization due 07/06/2020   Tympanic membrane inflammation 07/06/2020   Otitis media 05/26/2020   Morbid obesity with BMI of 45.0-49.9, adult ([place]) 09/09/2019   Insomnia 05/18/2019    OCCUPATIONAL THERAPY DISCHARGE SUMMARY  Visits from Start of Care: 2  Current functional level related to goals / functional outcomes: See above   Remaining deficits: See above   Education / Equipment: See above   Patient agrees to discharge. Patient goals were met. Patient is being discharged due to meeting the stated rehab goals.Ailene Ravel, OTR/L,CBIS  [phone]  12/04/2021, 6:14 PM  [facility] [address] [place], [place], [address] Phone: [phone]   Fax:  [phone]  Name: GISSEL KEILMAN MRN: [id number] Date of Birth: Nov 22, 1960

## 2021-12-11 ENCOUNTER — Ambulatory Visit: Payer: 59 | Admitting: Orthopaedic Surgery

## 2022-01-07 ENCOUNTER — Ambulatory Visit: Payer: 59 | Admitting: Gastroenterology

## 2022-01-22 ENCOUNTER — Ambulatory Visit: Payer: 59 | Admitting: Gastroenterology

## 2022-01-22 NOTE — Progress Notes (Deleted)
GI Office Note    Referring Provider: Renee Rival, FNP Primary Care Physician:  Renee Rival, FNP    Chief Complaint   No chief complaint on file.    History of Present Illness   Sarah Davila is a 61 y.o. female with a history of CAD, HTN, and kidney stones presenting today at the request of Renee Rival, FNP for ***evaluation of elevated liver enzymes  Abdominal X-ray February 2022 with evidence of cholelithiasis.  Last labs 09/04/21 - Alk Phos 174, AST 33, ALT 36, Hgb A1c 5.8, cholesterol 242, triglycerides normal, LDL 155.  Elevated LFTs Alcohol -  Drugs -  Herbals -  Parental exposures - IV drugs, blood, exposure to blood borne pathogens, tattoos? Occupational exposures? Travel -  Comorbidities? - obesity, cardiac, diabetes, celiac Pain? -    Past Medical History:  Diagnosis Date   Coronary artery disease    Hypertension    Kidney stone    Renal disorder    Sepsis ([place]) 2015   urinary    Past Surgical History:  Procedure Laterality Date   CYSTOSCOPY W/ URETERAL STENT PLACEMENT Bilateral 02/04/2019   Procedure: CYSTOSCOPY WITH RETROGRADE PYELOGRAM/URETERAL STENT PLACEMENT;  Surgeon: Irine Seal, MD;  Location: [facility];  Service: Urology;  Laterality: Bilateral;   CYSTOSCOPY/URETEROSCOPY/HOLMIUM LASER/STENT PLACEMENT Bilateral 02/16/2019   Procedure: CYSTOSCOPY BILATERAL URETEROSCOPY WITH HOLMIUM LASER AND STENTS PLACEMENT;  Surgeon: Irine Seal, MD;  Location: [facility];  Service: Urology;  Laterality: Bilateral;    Current Outpatient Medications  Medication Sig Dispense Refill   aspirin EC 81 MG tablet Take 81 mg by mouth daily.     Black Cohosh 200 MG CAPS Take 2 capsules by mouth daily.      cetirizine (ZYRTEC) 10 MG tablet Take 10 mg by mouth daily.     fexofenadine (ALLEGRA) 180 MG tablet Take 180 mg by mouth daily.     ibuprofen (ADVIL) 800 MG tablet Take 1 tablet (800 mg total) by mouth every 8 (eight) hours as needed. 30  tablet 0   lisinopril (ZESTRIL) 20 MG tablet Take 1 tablet (20 mg total) by mouth daily. 90 tablet 3   naproxen (NAPROSYN) 500 MG tablet Take 1 tablet (500 mg total) by mouth 2 (two) times daily with a meal. 60 tablet 5   Vitamin D, Ergocalciferol, (DRISDOL) 1.25 MG (50000 UNIT) CAPS capsule Take 1 capsule (50,000 Units total) by mouth every 7 (seven) days. 12 capsule 1   No current facility-administered medications for this visit.    Allergies as of 01/22/2022 - Review Complete 12/04/2021  Allergen Reaction Noted   Ciprofloxacin Other (See Comments) 05/04/2018    Family History  Problem Relation Age of Onset   Hepatitis Mother    Cirrhosis Mother    Diabetes Father    Cancer Other     Social History   Socioeconomic History   Marital status: Divorced    Spouse name: Not on file   Number of children: Not on file   Years of education: Not on file   Highest education level: Not on file  Occupational History   Not on file  Tobacco Use   Smoking status: Never   Smokeless tobacco: Never  Vaping Use   Vaping Use: Never used  Substance and Sexual Activity   Alcohol use: Never   Drug use: Never   Sexual activity: Yes    Birth control/protection: I.U.D.  Other Topics Concern   Not on file  Social  History Narrative   Not on file   Social Determinants of Health   Financial Resource Strain: Not on file  Food Insecurity: Not on file  Transportation Needs: Not on file  Physical Activity: Not on file  Stress: Not on file  Social Connections: Not on file  Intimate Partner Violence: Not on file     Review of Systems   Gen: Denies any fever, chills, fatigue, weight loss, lack of appetite.  CV: Denies chest pain, heart palpitations, peripheral edema, syncope.  Resp: Denies shortness of breath at rest or with exertion. Denies wheezing or cough.  GI: Denies dysphagia or odynophagia. Denies jaundice, hematemesis, fecal incontinence. GU : Denies urinary burning, urinary  frequency, urinary hesitancy MS: Denies joint pain, muscle weakness, cramps, or limitation of movement.  Derm: Denies rash, itching, dry skin Psych: Denies depression, anxiety, memory loss, and confusion Heme: Denies bruising, bleeding, and enlarged lymph nodes.   Physical Exam   LMP 02/15/2014 (Approximate) Comment: 5 years ago General:   Alert and oriented. Pleasant and cooperative. Well-nourished and well-developed.  Head:  Normocephalic and atraumatic. Eyes:  Without icterus, sclera clear and conjunctiva pink.  Ears:  Normal auditory acuity. Mouth:  No deformity or lesions, oral mucosa pink.  Lungs:  Clear to auscultation bilaterally. No wheezes, rales, or rhonchi. No distress.  Heart:  S1, S2 present without murmurs appreciated.  Abdomen:  +BS, soft, non-tender and non-distended. No HSM noted. No guarding or rebound. No masses appreciated.  Rectal:  Deferred  Msk:  Symmetrical without gross deformities. Normal posture. Extremities:  Without edema. Neurologic:  Alert and  oriented x4;  grossly normal neurologically. Skin:  Intact without significant lesions or rashes. Psych:  Alert and cooperative. Normal mood and affect.   Assessment   Sarah Davila is a 61 y.o. female with a history of HTN, CAD, kidney stones*** presenting today for evaluation of elevated liver enzymes   PLAN   *** CBC, CMP Obtain serology for hepatitis B and C (HBsAg, anti-HBs, anti-HBc, anti-HCV) Iron panel (Fe/TIBC >45%) and ferritin Abdominal ultrasound    Venetia Night, MSN, FNP-BC, AGACNP-BC [facility]

## 2022-07-26 ENCOUNTER — Telehealth: Payer: Self-pay | Admitting: Orthopaedic Surgery

## 2022-07-29 MED ORDER — NAPROXEN 500 MG PO TABS: 500.0000 mg | ORAL_TABLET | Freq: Two times a day (BID) | ORAL | 5 refills | Status: DC

## 2022-08-06 ENCOUNTER — Other Ambulatory Visit: Payer: Self-pay

## 2022-08-06 ENCOUNTER — Emergency Department (HOSPITAL_COMMUNITY)
Admission: EM | Admit: 2022-08-06 | Discharge: 2022-08-06 | Disposition: A | Discharge status: Home / Self Care | Payer: 59 | Attending: Emergency Medicine | Admitting: Emergency Medicine

## 2022-08-06 ENCOUNTER — Encounter (HOSPITAL_COMMUNITY): Payer: Self-pay

## 2022-08-06 ENCOUNTER — Emergency Department (HOSPITAL_COMMUNITY): Payer: 59

## 2022-08-06 DIAGNOSIS — N3001 Acute cystitis with hematuria: Secondary | ICD-10-CM | POA: Insufficient documentation

## 2022-08-06 DIAGNOSIS — D72829 Elevated white blood cell count, unspecified: Secondary | ICD-10-CM | POA: Insufficient documentation

## 2022-08-06 DIAGNOSIS — I2510 Atherosclerotic heart disease of native coronary artery without angina pectoris: Secondary | ICD-10-CM | POA: Insufficient documentation

## 2022-08-06 DIAGNOSIS — Z7982 Long term (current) use of aspirin: Secondary | ICD-10-CM | POA: Insufficient documentation

## 2022-08-06 DIAGNOSIS — N132 Hydronephrosis with renal and ureteral calculous obstruction: Secondary | ICD-10-CM | POA: Diagnosis not present

## 2022-08-06 DIAGNOSIS — I10 Essential (primary) hypertension: Secondary | ICD-10-CM | POA: Diagnosis not present

## 2022-08-06 DIAGNOSIS — Z79899 Other long term (current) drug therapy: Secondary | ICD-10-CM | POA: Diagnosis not present

## 2022-08-06 DIAGNOSIS — R1031 Right lower quadrant pain: Secondary | ICD-10-CM | POA: Diagnosis present

## 2022-08-06 DIAGNOSIS — N200 Calculus of kidney: Secondary | ICD-10-CM

## 2022-08-06 LAB — URINALYSIS, ROUTINE W REFLEX MICROSCOPIC
Bilirubin Urine: NEGATIVE
Glucose, UA: NEGATIVE mg/dL
Ketones, ur: NEGATIVE mg/dL
Nitrite: POSITIVE — AB
Protein, ur: 30 mg/dL — AB
RBC / HPF: >50 RBC/hpf — ABNORMAL HIGH (ref 0–5)
Specific Gravity, Urine: 1.02 (ref 1.005–1.030)
WBC, UA: >50 WBC/hpf — ABNORMAL HIGH (ref 0–5)
pH: 5 (ref 5.0–8.0)

## 2022-08-06 LAB — COMPREHENSIVE METABOLIC PANEL WITH GFR
ALT: 32 U/L (ref 0–44)
AST: 23 U/L (ref 15–41)
Albumin: 4 g/dL (ref 3.5–5.0)
Alkaline Phosphatase: 111 U/L (ref 38–126)
Anion gap: 8 (ref 5–15)
BUN: 34 mg/dL — ABNORMAL HIGH (ref 6–20)
CO2: 23 mmol/L (ref 22–32)
Calcium: 8.9 mg/dL (ref 8.9–10.3)
Chloride: 107 mmol/L (ref 98–111)
Creatinine, Ser: 1.3 mg/dL — ABNORMAL HIGH (ref 0.44–1.00)
GFR, Estimated: 47 mL/min — ABNORMAL LOW (ref >60)
Glucose, Bld: 115 mg/dL — ABNORMAL HIGH (ref 70–99)
Potassium: 3.9 mmol/L (ref 3.5–5.1)
Sodium: 138 mmol/L (ref 135–145)
Total Bilirubin: 0.6 mg/dL (ref 0.3–1.2)
Total Protein: 7.1 g/dL (ref 6.5–8.1)

## 2022-08-06 LAB — LIPASE, BLOOD: Lipase: 43 U/L (ref 11–51)

## 2022-08-06 LAB — CBC WITH DIFFERENTIAL/PLATELET
Abs Immature Granulocytes: 0.04 10*3/uL (ref 0.00–0.07)
Basophils Absolute: 0 10*3/uL (ref 0.0–0.1)
Basophils Relative: 0 %
Eosinophils Absolute: 0.1 10*3/uL (ref 0.0–0.5)
Eosinophils Relative: 1 %
HCT: 41.7 % (ref 36.0–46.0)
Hemoglobin: 13.4 g/dL (ref 12.0–15.0)
Immature Granulocytes: 0 %
Lymphocytes Relative: 13 %
Lymphs Abs: 1.5 10*3/uL (ref 0.7–4.0)
MCH: 27.7 pg (ref 26.0–34.0)
MCHC: 32.1 g/dL (ref 30.0–36.0)
MCV: 86.2 fL (ref 80.0–100.0)
Monocytes Absolute: 0.6 10*3/uL (ref 0.1–1.0)
Monocytes Relative: 5 %
Neutro Abs: 9 10*3/uL — ABNORMAL HIGH (ref 1.7–7.7)
Neutrophils Relative %: 81 %
Platelets: 289 10*3/uL (ref 150–400)
RBC: 4.84 MIL/uL (ref 3.87–5.11)
RDW: 13.4 % (ref 11.5–15.5)
WBC: 11.3 10*3/uL — ABNORMAL HIGH (ref 4.0–10.5)
nRBC: 0 % (ref 0.0–0.2)

## 2022-08-06 MED ORDER — OXYCODONE HCL 5 MG PO TABS: 5.0000 mg | ORAL_TABLET | Freq: Four times a day (QID) | ORAL | 0 refills | Status: DC | PRN

## 2022-08-06 MED ORDER — CEPHALEXIN 500 MG PO CAPS: 500.0000 mg | ORAL_CAPSULE | Freq: Two times a day (BID) | ORAL | 0 refills | Status: AC

## 2022-08-06 MED ORDER — ONDANSETRON HCL 4 MG/2ML IJ SOLN
4.0000 mg | Freq: Once | INTRAMUSCULAR | Status: AC
  Administered 2022-08-06: 4 mg via INTRAVENOUS
  Filled 2022-08-06: qty 2

## 2022-08-06 MED ORDER — TAMSULOSIN HCL 0.4 MG PO CAPS
0.4000 mg | ORAL_CAPSULE | Freq: Once | ORAL | Status: AC
  Administered 2022-08-06: 0.4 mg via ORAL
  Filled 2022-08-06: qty 1

## 2022-08-06 MED ORDER — KETOROLAC TROMETHAMINE 30 MG/ML IJ SOLN
15.0000 mg | Freq: Once | INTRAMUSCULAR | Status: AC
  Administered 2022-08-06: 15 mg via INTRAVENOUS
  Filled 2022-08-06: qty 1

## 2022-08-06 MED ORDER — HYDROCODONE-ACETAMINOPHEN 5-325 MG PO TABS
1.0000 | ORAL_TABLET | Freq: Once | ORAL | Status: DC
  Filled 2022-08-06: qty 1

## 2022-08-06 MED ORDER — TAMSULOSIN HCL 0.4 MG PO CAPS: 0.4000 mg | ORAL_CAPSULE | Freq: Every day | ORAL | 0 refills | Status: DC

## 2022-08-06 MED ORDER — SODIUM CHLORIDE 0.9 % IV BOLUS
1000.0000 mL | Freq: Once | INTRAVENOUS | Status: AC
  Administered 2022-08-06: 1000 mL via INTRAVENOUS

## 2022-08-06 MED ORDER — SODIUM CHLORIDE 0.9 % IV SOLN
1.0000 g | Freq: Once | INTRAVENOUS | Status: AC
  Administered 2022-08-06: 1 g via INTRAVENOUS
  Filled 2022-08-06: qty 10

## 2022-08-06 NOTE — ED Provider Notes (Signed)
Care transferred from Sherian Maroon, PA-C at time of sign out. See their note for full assessment.   Briefly: Patient is 61 y.o. female with history of kidney stones who presents to the ED with concerns for right flank pain.  [facility] is followed by [facility].  She has had stents placed in the past.     Plan: Plan per previous PA-C: Pending urology consult, likely discharged home with antibiotics and follow-up with urology.  Labs Reviewed  URINALYSIS, ROUTINE W REFLEX MICROSCOPIC - Abnormal; Notable for the following components:      Result Value   APPearance HAZY (*)    Hgb urine dipstick LARGE (*)    Protein, ur 30 (*)    Nitrite POSITIVE (*)    Leukocytes,Ua MODERATE (*)    RBC / HPF >50 (*)    WBC, UA >50 (*)    Bacteria, UA MANY (*)    All other components within normal limits  COMPREHENSIVE METABOLIC PANEL - Abnormal; Notable for the following components:   Glucose, Bld 115 (*)    BUN 34 (*)    Creatinine, Ser 1.30 (*)    GFR, Estimated 47 (*)    All other components within normal limits  CBC WITH DIFFERENTIAL/PLATELET - Abnormal; Notable for the following components:   WBC 11.3 (*)    Neutro Abs 9.0 (*)    All other components within normal limits  URINE CULTURE  LIPASE, BLOOD    Clinical Course as of 08/06/22 2028  Tue Aug 06, 2022  2011 Consult with Urologist, Dr. Marlou Porch who recommends follow up with her urologist in the office and treatment with flomax and antibiotics.   [SB]  2021 Discussed with patient discharge treatment plan.  Answered all available questions.  Patient appears safe for discharge at this time. [SB]    Clinical Course User Index [SB] Srihitha Tagliaferri A, PA-C     Presenting suspicious for acute cystitis with hematuria as well as kidney stone.  Patient afebrile and asymptomatic following treatment regimen in the ED.  Discussed with patient discharge treatment plan.  Patient agreeable at this time.  Patient sent with prescriptions for Flomax,  oxycodone, Keflex.  Patient instructed to follow-up with her urologist this week regarding today's ED visit. Supportive care and strict return precautions discussed with patient. Patient acknowledges and verbalizes understanding. Pt appears safe for discharge. Follow up as indicated in discharge paperwork.    This chart was dictated using voice recognition software, Dragon. Despite the best efforts of this provider to proofread and correct errors, errors may still occur which can change documentation meaning.    Juanette Urizar A, PA-C 08/06/22 2028    Bethann Berkshire, MD 08/07/22 1236

## 2022-08-06 NOTE — ED Notes (Signed)
ED Provider at bedside. 

## 2022-08-06 NOTE — ED Provider Notes (Signed)
[facility] EMERGENCY DEPARTMENT Provider Note   CSN: [id number] Arrival date & time: 08/06/22  1618     History {Add pertinent medical, surgical, social history, OB history to HPI:1} No chief complaint on file.   Sarah Davila is a 61 y.o. female.  HPI   61 year old female presents emergency department with complaints of right-sided flank pain.  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  She reports history of nephrolithiasis and states it feels similar.  She reports that it was a muscular strain but pain did not decrease administration of Aleve.  Associated symptoms include nausea without emesis.  Continued pain as well as concern for nephrolithiasis part of the emergency department.  Denies fever, urinary retention, hematuria, dysuria.  Patient has had to have stents placed in the past due to stones.  Denies fever, chills, night sweats, chest pain, shortness of breath, vomiting, vaginal symptoms, change in bowel habits.  Past medical history significant for nephrolithiasis coronary artery disease, hypertension, obesity  Home Medications Prior to Admission medications   Medication Sig Start Date End Date Taking? Authorizing Provider  aspirin EC 81 MG tablet Take 81 mg by mouth daily.    [provider]  Black Cohosh 200 MG CAPS Take 2 capsules by mouth daily.     [provider]  cetirizine (ZYRTEC) 10 MG tablet Take 10 mg by mouth daily.    [provider]  fexofenadine (ALLEGRA) 180 MG tablet Take 180 mg by mouth daily.    [provider]  ibuprofen (ADVIL) 800 MG tablet Take 1 tablet (800 mg total) by mouth every 8 (eight) hours as needed. 09/21/20   Noreene Larsson, NP  lisinopril (ZESTRIL) 20 MG tablet Take 1 tablet (20 mg total) by mouth daily. 09/27/21   Renee Rival, FNP  naproxen (NAPROSYN) 500 MG tablet Take 1 tablet (500 mg total) by mouth 2 (two) times daily  with a meal. 07/29/22   Sanjuana Kava, MD  Vitamin D, Ergocalciferol, (DRISDOL) 1.25 MG (50000 UNIT) CAPS capsule Take 1 capsule (50,000 Units total) by mouth every 7 (seven) days. 09/05/20   Perlie Mayo, NP      Allergies    Ciprofloxacin    Review of Systems   Review of Systems  All other systems reviewed and are negative.   Physical Exam Updated Vital Signs LMP 03/15/2013 (Approximate) Comment: LMP @ age 14. Pt has Mirena IUD Physical Exam Vitals and nursing note reviewed.  Constitutional:      General: She is not in acute distress.    Appearance: She is well-developed.  HENT:     Head: Normocephalic and atraumatic.  Eyes:     Conjunctiva/sclera: Conjunctivae normal.  Cardiovascular:     Rate and Rhythm: Normal rate and regular rhythm.     Heart sounds: No murmur heard. Pulmonary:     Effort: Pulmonary effort is normal. No respiratory distress.     Breath sounds: Normal breath sounds.  Abdominal:     Palpations: Abdomen is soft.     Tenderness: There is no abdominal tenderness. There is right CVA tenderness. There is no left CVA tenderness.  Musculoskeletal:        General: No swelling.     Cervical back: Neck supple. No rigidity or tenderness.     Right lower leg: No edema.     Left lower leg: No edema.  Skin:    General: Skin is  warm and dry.     Capillary Refill: Capillary refill takes less than 2 seconds.  Neurological:     Mental Status: She is alert.  Psychiatric:        Mood and Affect: Mood normal.    ED Results / Procedures / Treatments   Labs (all labs ordered are listed, but only abnormal results are displayed) Labs Reviewed - No data to display  EKG None  Radiology No results found.  Procedures Procedures  {Document cardiac monitor, telemetry assessment procedure when appropriate:1}  Medications Ordered in ED Medications - No data to display  ED Course/ Medical Decision Making/ A&P                           Medical Decision  Making Amount and/or Complexity of Data Reviewed Labs: ordered. Radiology: ordered.  Risk Prescription drug management.   This patient presents to the ED for concern of right flank pain, this involves an extensive number of treatment options, and is a complaint that carries with it a high risk of complications and morbidity.  The differential diagnosis includes appendicitis, diverticulitis, nephrolithiasis, pyelonephritis, cholecystitis, hepatitis, SBO/LBO, ovarian torsion, ectopic pregnancy, hernia   Co morbidities that complicate the patient evaluation  See HPI   Additional history obtained:  Additional history obtained from EMR External records from outside source obtained and reviewed including prior CT renal stone study from 09/12/2020   Lab Tests:  I Ordered, and personally interpreted labs.  The pertinent results include:  ***   Imaging Studies ordered:  I ordered imaging studies including CT renal stone study I independently visualized and interpreted imaging which showed 3 x 3 mm obstructing upper right ureteral calculus located just above the UPJ with moderate right-sided hydronephrosis.  Bilateral renal calculi.  Cholelithiasis.  Stable age advanced atherosclerotic calcifications involving the aorta and branch vessels.  Aortic atherosclerosis. I agree with the radiologist interpretation  Cardiac Monitoring: / EKG:  The patient was maintained on a cardiac monitor.  I personally viewed and interpreted the cardiac monitored which showed an underlying rhythm of: Sinus rhythm   Consultations Obtained:  I requested consultation with the ***,  and discussed lab and imaging findings as well as pertinent plan - they recommend: ***   Problem List / ED Course / Critical interventions / Medication management  Nephrolithiasis I ordered medication including ***  for ***  Reevaluation of the patient after these medicines showed that the patient  {resolved/improved/worsened:23923::"improved"} I have reviewed the patients home medicines and have made adjustments as needed   Social Determinants of Health:  ***   Test / Admission - Considered:  Vitals signs significant for ***. Otherwise within normal range and stable throughout visit. Laboratory/imaging studies significant for: *** *** Worrisome signs and symptoms were discussed with the patient, and the patient acknowledged understanding to return to the ED if noticed. Patient was stable upon discharge. ]  {Document critical care time when appropriate:1} {Document review of labs and clinical decision tools ie heart score, Chads2Vasc2 etc:1}  {Document your independent review of radiology images, and any outside records:1} {Document your discussion with family members, caretakers, and with consultants:1} {Document social determinants of health affecting pt's care:1} {Document your decision making why or why not admission, treatments were needed:1} Final Clinical Impression(s) / ED Diagnoses Final diagnoses:  None    Rx / DC Orders ED Discharge Orders     None

## 2022-08-06 NOTE — ED Triage Notes (Signed)
Pt c/o back lower right pain started today. Pt ambulated to [facility]. 20G IV in [facility]. Pt goes to urologist at [facility] Charda Janis.

## 2022-08-06 NOTE — Discharge Instructions (Addendum)
Your work-up today was overall consistent with kidney stone on the right side.  As discussed, we will treat this daily with Flomax.  Recommend taking Tylenol every 4-6 hours for baseline pain and the pain medicine for breakthrough pain.  You will be sent an antibiotic (Keflex) to your pharmacy, take as directed.  Ensure to maintain fluid intake.  It is important that you call your urologist to set up a follow-up appointment for this week or early next week.  Return to the emergency department for experiencing increasing/worsening symptoms such as fever, uncontrollable vomiting, worsening symptoms.

## 2022-08-08 LAB — URINE CULTURE: Culture: 50000 — AB

## 2022-08-09 ENCOUNTER — Telehealth (HOSPITAL_BASED_OUTPATIENT_CLINIC_OR_DEPARTMENT_OTHER): Payer: Self-pay | Admitting: *Deleted

## 2022-08-09 NOTE — Telephone Encounter (Deleted)
Post ED Visit - Positive Culture Follow-up  Culture report reviewed by antimicrobial stewardship pharmacist: [place] Team []  Elenor Quinones, Pharm.D. [x]  Heide Guile, Pharm.D., BCPS AQ-ID []  Parks Neptune, Pharm.D., BCPS []  Alycia Rossetti, Pharm.D., BCPS []  [place], Pharm.D., BCPS, AAHIVP []  Legrand Como, Pharm.D., BCPS, AAHIVP []  Salome Arnt, PharmD, BCPS []  Johnnette Gourd, PharmD, BCPS []  Hughes Better, PharmD, BCPS []  Leeroy Cha, PharmD []  Laqueta Linden, PharmD, BCPS []  Albertina Parr, PharmD  [place] Team []  Leodis Sias, PharmD []  Lindell Spar, PharmD []  Royetta Asal, PharmD []  Graylin Shiver, Rph []  Rema Fendt) Glennon Mac, PharmD []  Arlyn Dunning, PharmD []  Netta Cedars, PharmD []  Dia Sitter, PharmD []  Leone Haven, PharmD []  Gretta Arab, PharmD []  Theodis Shove, PharmD []  Peggyann Juba, PharmD []  Reuel Boom, PharmD   Positive *** culture Treated with ***, organism sensitive to the same and no further patient follow-up is required at this time.  Sarah Davila 08/09/2022, 10:12 AM Post ED Visit - Positive Culture Follow-up  Culture report reviewed by antimicrobial stewardship pharmacist: [place] Team []  Elenor Quinones, Pharm.D. []  Heide Guile, Pharm.D., BCPS AQ-ID []  Parks Neptune, Pharm.D., BCPS []  Alycia Rossetti, Pharm.D., BCPS []  [place], Pharm.D., BCPS, AAHIVP []  Legrand Como, Pharm.D., BCPS, AAHIVP []  Salome Arnt, PharmD, BCPS []  Johnnette Gourd, PharmD, BCPS []  Hughes Better, PharmD, BCPS []  Leeroy Cha, PharmD []  Laqueta Linden, PharmD, BCPS []  Albertina Parr, PharmD  [place] Team []  Leodis Sias, PharmD []  Lindell Spar, PharmD []  Royetta Asal, PharmD []  Graylin Shiver, Rph []  Rema Fendt) Glennon Mac, PharmD []  Arlyn Dunning, PharmD []  Netta Cedars, PharmD []  Dia Sitter, PharmD []  Leone Haven, PharmD []  Gretta Arab,  PharmD []  Theodis Shove, PharmD []  Peggyann Juba, PharmD []  Reuel Boom, PharmD   Positive urine culture Treated with Cephalexin, organism sensitive to the same and no further patient follow-up is required at this time.  Heide Guile, PharmD  Sarah Davila 08/09/2022, 10:12 AM

## 2022-08-09 NOTE — Telephone Encounter (Signed)
Post ED Visit - Positive Culture Follow-up  Culture report reviewed by antimicrobial stewardship pharmacist: [place] Team []  Elenor Quinones, Pharm.D. [x]  Heide Guile, Pharm.D., BCPS AQ-ID []  Parks Neptune, Pharm.D., BCPS []  Alycia Rossetti, Pharm.D., BCPS []  [place], [place].D., BCPS, AAHIVP []  Legrand Como, Pharm.D., BCPS, AAHIVP []  Salome Arnt, PharmD, BCPS []  Johnnette Gourd, PharmD, BCPS []  Hughes Better, PharmD, BCPS []  Leeroy Cha, PharmD []  Laqueta Linden, PharmD, BCPS []  Albertina Parr, PharmD  [place] Team []  Leodis Sias, PharmD []  Lindell Spar, PharmD []  Royetta Asal, PharmD []  Graylin Shiver, Rph []  Rema Fendt) Glennon Mac, PharmD []  Arlyn Dunning, PharmD []  Netta Cedars, PharmD []  Dia Sitter, PharmD []  Leone Haven, PharmD []  Gretta Arab, PharmD []  Theodis Shove, PharmD []  Peggyann Juba, PharmD []  Reuel Boom, PharmD   Positive urine culture Treated with Cefdinir, organism sensitive to the same and no further patient follow-up is required at this time.  Harlon Flor [facility] 08/09/2022, 10:13 AM

## 2022-08-14 ENCOUNTER — Ambulatory Visit (INDEPENDENT_AMBULATORY_CARE_PROVIDER_SITE_OTHER): Payer: 59 | Admitting: Urology

## 2022-08-14 ENCOUNTER — Encounter: Payer: Self-pay | Admitting: Urology

## 2022-08-14 VITALS — BP 131/83 | HR 86 | Ht 62.0 in | Wt 239.0 lb

## 2022-08-14 DIAGNOSIS — N1330 Unspecified hydronephrosis: Secondary | ICD-10-CM

## 2022-08-14 DIAGNOSIS — N202 Calculus of kidney with calculus of ureter: Secondary | ICD-10-CM | POA: Diagnosis not present

## 2022-08-14 DIAGNOSIS — N200 Calculus of kidney: Secondary | ICD-10-CM | POA: Insufficient documentation

## 2022-08-14 DIAGNOSIS — Z87440 Personal history of urinary (tract) infections: Secondary | ICD-10-CM | POA: Diagnosis not present

## 2022-08-14 DIAGNOSIS — N201 Calculus of ureter: Secondary | ICD-10-CM

## 2022-08-14 NOTE — Progress Notes (Signed)
Assessment: 1. Ureteral calculus; right, 3 mm   2. Nephrolithiasis   3. History of UTI     Plan: I personally reviewed the patient records from her ER visit on 08/06/2022 including provider notes, lab results, and imaging results. I personally reviewed the CT study from 08/06/2022 with results as noted below.  I also personally viewed the CT study from 12/21. Comparison of the CT studies shows no significant change in her stone burden. Continue dietary management for prevention of kidney stones. Return return to office with Dr. Annabell Howells in 6 months with KUB.   Chief Complaint:  Chief Complaint  Patient presents with   Nephrolithiasis    History of Present Illness:  Sarah Davila is a 61 y.o. female who is seen for evaluation of right ureteral calculus. She presented to the emergency room on 08/06/2022 with right flank pain.  No fevers, chills, nausea, or vomiting.  CT imaging showed a 3 x 3 mm right proximal ureteral calculus with moderate right-sided hydronephrosis, and bilateral renal calculi.  Her urinalysis was suspicious for UTI.  Urine culture grew 50 K E. coli.  She was treated with Rocephin and cephalexin.  She passed the stone on 08/12/2022.  She has had no further flank pain.  No dysuria or gross hematuria.  She completed her antibiotics last night.  She has a prior history of nephrolithiasis and has undergone ureteroscopy with laser lithotripsy in 2020. Stone analysis showed mixed calcium oxalate.  She was found to have an elevated uric acid of 7.9.  24-hour urine showed a elevated urine calcium and high pH.  Past Medical History:  Past Medical History:  Diagnosis Date   Coronary artery disease    Hypertension    Kidney stone    Renal disorder    Sepsis ([facility]) 2015   urinary    Past Surgical History:  Past Surgical History:  Procedure Laterality Date   CYSTOSCOPY W/ URETERAL STENT PLACEMENT Bilateral 02/04/2019   Procedure: CYSTOSCOPY WITH RETROGRADE  PYELOGRAM/URETERAL STENT PLACEMENT;  Surgeon: Bjorn Pippin, MD;  Location: [facility];  Service: Urology;  Laterality: Bilateral;   CYSTOSCOPY/URETEROSCOPY/HOLMIUM LASER/STENT PLACEMENT Bilateral 02/16/2019   Procedure: CYSTOSCOPY BILATERAL URETEROSCOPY WITH HOLMIUM LASER AND STENTS PLACEMENT;  Surgeon: Bjorn Pippin, MD;  Location: [facility];  Service: Urology;  Laterality: Bilateral;    Allergies:  Allergies  Allergen Reactions   Ciprofloxacin Other (See Comments)    Sever hot flashes    Family History:  Family History  Problem Relation Age of Onset   Hepatitis Mother    Cirrhosis Mother    Diabetes Father    Cancer Other     Social History:  Social History   Tobacco Use   Smoking status: Never   Smokeless tobacco: Never  Vaping Use   Vaping Use: Never used  Substance Use Topics   Alcohol use: Never   Drug use: Never    Review of symptoms:  Constitutional:  Negative for unexplained weight loss, night sweats, fever, chills ENT:  Negative for nose bleeds, sinus pain, painful swallowing CV:  Negative for chest pain, shortness of breath, exercise intolerance, palpitations, loss of consciousness Resp:  Negative for cough, wheezing, shortness of breath GI:  Negative for nausea, vomiting, diarrhea, bloody stools GU:  Positives noted in HPI; otherwise negative for gross hematuria, dysuria, urinary incontinence Neuro:  Negative for seizures, poor balance, limb weakness, slurred speech Psych:  Negative for lack of energy, depression, anxiety Endocrine:  Negative for polydipsia, polyuria, symptoms of hypoglycemia (  dizziness, hunger, sweating) Hematologic:  Negative for anemia, purpura, petechia, prolonged or excessive bleeding, use of anticoagulants  Allergic:  Negative for difficulty breathing or choking as a result of exposure to anything; no shellfish allergy; no allergic response (rash/itch) to materials, foods  Physical exam: BP 131/83   Pulse 86   Ht 5\' 2"  (1.575 m)   Wt 239 lb  (108.4 kg)   LMP 03/15/2013 (Approximate) Comment: LMP @ age 27. Pt has Mirena IUD  BMI 43.71 kg/m  GENERAL APPEARANCE:  Well appearing, well developed, well nourished, NAD HEENT: Atraumatic, Normocephalic, oropharynx clear. NECK: Supple without lymphadenopathy or thyromegaly. LUNGS: Clear to auscultation bilaterally. HEART: Regular Rate and Rhythm without murmurs, gallops, or rubs. ABDOMEN: Soft, non-tender, No Masses. EXTREMITIES: Moves all extremities well.  Without clubbing, cyanosis, or edema. NEUROLOGIC:  Alert and oriented x 3, normal gait, CN II-XII grossly intact.  MENTAL STATUS:  Appropriate. BACK:  Non-tender to palpation.  No CVAT SKIN:  Warm, dry and intact.    Results: U/A:  0-5 WBC, 0-2 RBC

## 2022-08-15 LAB — MICROSCOPIC EXAMINATION: Bacteria, UA: NONE SEEN

## 2022-08-15 LAB — URINALYSIS, ROUTINE W REFLEX MICROSCOPIC
Bilirubin, UA: NEGATIVE
Glucose, UA: NEGATIVE
Ketones, UA: NEGATIVE
Nitrite, UA: NEGATIVE
Protein,UA: NEGATIVE
Specific Gravity, UA: 1.015 (ref 1.005–1.030)
Urobilinogen, Ur: 0.2 mg/dL (ref 0.2–1.0)
pH, UA: 6.5 (ref 5.0–7.5)

## 2022-08-23 ENCOUNTER — Ambulatory Visit (INDEPENDENT_AMBULATORY_CARE_PROVIDER_SITE_OTHER): Payer: 59 | Admitting: Family Medicine

## 2022-08-23 VITALS — BP 134/81 | HR 61 | Ht 62.0 in | Wt 239.0 lb

## 2022-08-23 DIAGNOSIS — R300 Dysuria: Secondary | ICD-10-CM | POA: Diagnosis not present

## 2022-08-23 DIAGNOSIS — Z131 Encounter for screening for diabetes mellitus: Secondary | ICD-10-CM | POA: Diagnosis not present

## 2022-08-23 DIAGNOSIS — E559 Vitamin D deficiency, unspecified: Secondary | ICD-10-CM

## 2022-08-23 DIAGNOSIS — Z6841 Body Mass Index (BMI) 40.0 and over, adult: Secondary | ICD-10-CM

## 2022-08-23 DIAGNOSIS — I10 Essential (primary) hypertension: Secondary | ICD-10-CM

## 2022-08-23 DIAGNOSIS — H6502 Acute serous otitis media, left ear: Secondary | ICD-10-CM

## 2022-08-23 DIAGNOSIS — Z1231 Encounter for screening mammogram for malignant neoplasm of breast: Secondary | ICD-10-CM

## 2022-08-23 LAB — POCT URINALYSIS DIP (CLINITEK)
Bilirubin, UA: NEGATIVE
Glucose, UA: NEGATIVE mg/dL
Ketones, POC UA: NEGATIVE mg/dL
Nitrite, UA: NEGATIVE
POC PROTEIN,UA: NEGATIVE
Spec Grav, UA: 1.01 (ref 1.010–1.025)
Urobilinogen, UA: 0.2 E.U./dL
pH, UA: 6 (ref 5.0–8.0)

## 2022-08-23 MED ORDER — AMPICILLIN 500 MG PO CAPS: 500.0000 mg | ORAL_CAPSULE | Freq: Three times a day (TID) | ORAL | 0 refills | Status: DC

## 2022-08-23 NOTE — Patient Instructions (Addendum)
Follow-up with Peter Congo in the next 2 to 6 weeks.  Call if you need to be seen sooner.  Please get fasting lipid Chem-7 and EGFR TSH HbA1c and vitamin D next week.  Please schedule mammogram at checkout.  Urine appears to be infected this is sent for culture.  A 5-day antibiotic course, ampicillin,  is prescribed which will treat both the urine and the ear infection.  Blood pressure slightly elevated today.  Reduce salty foods and increase vegetables.  Reduce portion sizes and work on weight loss.  All of this will help with improving blood pressure.  No change in blood pressure medicine today.  Flu vaccine is to be offered and given if she has not already had it.  And if  she agrees.  It is important that you exercise regularly at least 30 minutes 5 times a week. If you develop chest pain, have severe difficulty breathing, or feel very tired, stop exercising immediately and seek medical attention   Thanks for choosing [facility], we consider it a privelige to serve you.

## 2022-08-23 NOTE — Progress Notes (Unsigned)
   Sarah Davila     MRN: [id number]      DOB: 1961-03-29   HPI [name] is here with c/o ear pain , which has responded to Claritin and allegra, so she  3 day h/o abnormal appearing urine, cloudy, initially 2 days ago a had slight burning, none since. No fever, chills, flank pain or nausea No documented UTI for over 1 year, recently ahd a stone which she pasesed.Very worried about possible UTI ROS Denies recent fever or chills. Denies sinus pressure, nasal congestion, ear pain or sore throat. Denies chest congestion, productive cough or wheezing. Denies chest pains, palpitations and leg swelling Denies abdominal pain, nausea, vomiting,diarrhea or constipation.   Denies dysuria, frequency, hesitancy or incontinence. Denies joint pain, swelling and limitation in mobility. Denies headaches, seizures, numbness, or tingling. Denies depression, anxiety or insomnia. Denies skin break down or rash.   PE  BP 134/81 (BP Location: Left Arm, Patient Position: Sitting, Cuff Size: Large)   Pulse 61   Ht 5\' 2"  (1.575 m)   Wt 239 lb (108.4 kg)   LMP 03/15/2013 (Approximate) Comment: LMP @ age 61. Pt has Mirena IUD  SpO2 95%   BMI 43.71 kg/m   Patient alert and oriented and in no cardiopulmonary distress.  HEENT: No facial asymmetry, EOMI,     Neck supple .  Chest: Clear to auscultation bilaterally.  CVS: S1, S2 no murmurs, no S3.Regular rate.  ABD: Soft non tender.   Ext: No edema  MS: Adequate ROM spine, shoulders, hips and knees.  Skin: Intact, no ulcerations or rash noted.  Psych: Good eye contact, normal affect. Memory intact not anxious or depressed appearing.  CNS: CN 2-12 intact, power,  normal throughout.no focal deficits noted.   Assessment & Plan  ***

## 2022-08-25 ENCOUNTER — Encounter: Payer: Self-pay | Admitting: Family Medicine

## 2022-08-25 DIAGNOSIS — R300 Dysuria: Secondary | ICD-10-CM | POA: Insufficient documentation

## 2022-08-25 NOTE — Assessment & Plan Note (Signed)
  Patient re-educated about  the importance of commitment to a  minimum of 150 minutes of exercise per week as able.  The importance of healthy food choices with portion control discussed, as well as eating regularly and within a 12 hour window most days. The need to choose "clean , green" food 50 to 75% of the time is discussed, as well as to make water the primary drink and set a goal of 64 ounces water daily.       08/23/2022    8:31 AM 08/14/2022    9:01 AM 08/06/2022    4:32 PM  Weight /BMI  Weight 239 lb 239 lb 236 lb  Height 5\' 2"  (1.575 m) 5\' 2"  (1.575 m) 5\' 2"  (1.575 m)  BMI 43.71 kg/m2 43.71 kg/m2 43.16 kg/m2

## 2022-08-25 NOTE — Assessment & Plan Note (Signed)
Symptomatic with abn CCUA, sent for c/s and ampicillin prescribed

## 2022-08-25 NOTE — Assessment & Plan Note (Signed)
Left otitis media, ampicillin prescribed

## 2022-08-25 NOTE — Assessment & Plan Note (Signed)
DASH diet and commitment to daily physical activity for a minimum of 30 minutes discussed and encouraged, as a part of hypertension management. The importance of attaining a healthy weight is also discussed.     08/23/2022    8:31 AM 08/14/2022    9:01 AM 08/06/2022    9:00 PM 08/06/2022    7:30 PM 08/06/2022    7:00 PM 08/06/2022    5:30 PM 08/06/2022    5:00 PM  BP/Weight  Systolic BP 134 131 134 144 147 134 133  Diastolic BP 81 83 71 57 64 64 69  Wt. (Lbs) 239 239       BMI 43.71 kg/m2 43.71 kg/m2          Lifestyle change to lower systolic , no med change

## 2022-08-26 ENCOUNTER — Telehealth: Payer: Self-pay

## 2022-08-26 NOTE — Telephone Encounter (Signed)
Patient called and wanted to know if you could place a referral for Nephrology. She advised she is concerned with her kidney function.   Thank you

## 2022-08-27 NOTE — Telephone Encounter (Signed)
FYI patient scheduled for BMP labs on 12/04 and will f/u with Dr. Annabell Howells 12/14.  Patient wishes to stay in [place] for her care.

## 2022-08-28 ENCOUNTER — Telehealth: Payer: Self-pay

## 2022-08-28 ENCOUNTER — Other Ambulatory Visit: Payer: Self-pay

## 2022-08-28 LAB — URINE CULTURE

## 2022-08-28 MED ORDER — AMPICILLIN 500 MG PO CAPS: 500.0000 mg | ORAL_CAPSULE | Freq: Three times a day (TID) | ORAL | 0 refills | Status: DC

## 2022-08-28 NOTE — Telephone Encounter (Signed)
Refill sent to [facility] patient aware

## 2022-08-28 NOTE — Telephone Encounter (Signed)
Patient called in regards to urine culture she is finishing her antibiotic today and is still having all the same symptoms she is afraid to go throught the holiday weekend without medication could you call in a few more days of medication? She states she has gotten septic in the past and is worried [phone]

## 2022-08-29 LAB — BMP8+EGFR
BUN/Creatinine Ratio: 18 (ref 9–23)
BUN: 16 mg/dL (ref 6–24)
CO2: 20 mmol/L (ref 20–29)
Calcium: 10.1 mg/dL (ref 8.7–10.2)
Chloride: 101 mmol/L (ref 96–106)
Creatinine, Ser: 0.88 mg/dL (ref 0.57–1.00)
Glucose: 101 mg/dL — ABNORMAL HIGH (ref 70–99)
Potassium: 4.3 mmol/L (ref 3.5–5.2)
Sodium: 139 mmol/L (ref 134–144)
eGFR: 76 mL/min/{1.73_m2} (ref >59)

## 2022-08-29 LAB — VITAMIN D 25 HYDROXY (VIT D DEFICIENCY, FRACTURES): Vit D, 25-Hydroxy: 21.7 ng/mL — ABNORMAL LOW (ref 30.0–100.0)

## 2022-08-29 LAB — LIPID PANEL
Chol/HDL Ratio: 3.1 ratio (ref 0.0–4.4)
Cholesterol, Total: 214 mg/dL — ABNORMAL HIGH (ref 100–199)
HDL: 69 mg/dL (ref >39)
LDL Chol Calc (NIH): 130 mg/dL — ABNORMAL HIGH (ref 0–99)
Triglycerides: 84 mg/dL (ref 0–149)
VLDL Cholesterol Cal: 15 mg/dL (ref 5–40)

## 2022-08-29 LAB — HEMOGLOBIN A1C
Est. average glucose Bld gHb Est-mCnc: 114 mg/dL
Hgb A1c MFr Bld: 5.6 % (ref 4.8–5.6)

## 2022-09-02 ENCOUNTER — Telehealth: Payer: Self-pay | Admitting: Family Medicine

## 2022-09-02 NOTE — Telephone Encounter (Signed)
Pt called stating her urine is still foamy. States today is the last day of her abx. Wants to know what she needs to do?

## 2022-09-02 NOTE — Telephone Encounter (Signed)
Spoke to pt states antibiotic has not helped saw Dr. Lodema Hong on 08/23/22, states urine is foamy and has strong odor, please advice?

## 2022-09-02 NOTE — Telephone Encounter (Signed)
Tried calling pt, Sarah Davila.

## 2022-09-03 NOTE — Telephone Encounter (Signed)
Please have the patient leave a urine sample and schedule televisit with me as soon as possible

## 2022-09-04 ENCOUNTER — Ambulatory Visit (INDEPENDENT_AMBULATORY_CARE_PROVIDER_SITE_OTHER): Payer: 59 | Admitting: Family Medicine

## 2022-09-04 ENCOUNTER — Encounter: Payer: Self-pay | Admitting: Family Medicine

## 2022-09-04 DIAGNOSIS — N3090 Cystitis, unspecified without hematuria: Secondary | ICD-10-CM

## 2022-09-04 MED ORDER — NITROFURANTOIN MONOHYD MACRO 100 MG PO CAPS: 100.0000 mg | ORAL_CAPSULE | Freq: Two times a day (BID) | ORAL | 0 refills | Status: AC

## 2022-09-04 NOTE — Progress Notes (Signed)
Virtual Visit via Telephone Note   This visit type was conducted via telephone. This format is felt to be most appropriate for this patient at this time.  The patient did not have access to video technology/had technical difficulties with video requiring transitioning to audio format only (telephone).  All issues noted in this document were discussed and addressed.  No physical exam could be performed with this format.  Evaluation Performed:  Follow-up visit  Date:  09/04/2022   ID:  Sarah, Davila 03-04-1961, MRN [id number]  Patient Location: Home Provider Location: Clinic  Participants: Patient Location of Patient: Home Location of Provider: clinic Consent was obtain for visit to be over via telehealth. I verified that I am speaking with the correct person using two identifiers.  PCP:  Gilmore Laroche, FNP   Chief Complaint: Foamy urine  History of Present Illness:    Sarah Davila is a 61 y.o. female with complaints of having foamy urine with malodor that has persisted after completion of a 5-day oral antibiotic with ampicillin.she was seen on 08/23/2022 with complaint of dysuria and ear infection.  She denies urgency, frequency, and pain with urination, noting that her only complaints is that her urine is foamy. She reports following up with the urologist on 09/19/2022.  She has a Mirena IUD placed on May 25, 2015 with plans for removal in January 2024.  She reported a concern about the Mirena causing her urine to be foamy,noting a history of sepsis.    The patient does not have symptoms concerning for COVID-19 infection (fever, chills, cough, or new shortness of breath).      Past Medical, Surgical, Social History, Allergies, and Medications have been Reviewed.  Past Medical History:  Diagnosis Date   Coronary artery disease    Hypertension    Kidney stone    Renal disorder    Sepsis (HCC) 2015   urinary   Past Surgical History:  Procedure  Laterality Date   CYSTOSCOPY W/ URETERAL STENT PLACEMENT Bilateral 02/04/2019   Procedure: CYSTOSCOPY WITH RETROGRADE PYELOGRAM/URETERAL STENT PLACEMENT;  Surgeon: Bjorn Pippin, MD;  Location: [facility];  Service: Urology;  Laterality: Bilateral;   CYSTOSCOPY/URETEROSCOPY/HOLMIUM LASER/STENT PLACEMENT Bilateral 02/16/2019   Procedure: CYSTOSCOPY BILATERAL URETEROSCOPY WITH HOLMIUM LASER AND STENTS PLACEMENT;  Surgeon: Bjorn Pippin, MD;  Location: [facility];  Service: Urology;  Laterality: Bilateral;     Current Meds  Medication Sig   ampicillin (PRINCIPEN) 500 MG capsule Take 1 capsule (500 mg total) by mouth 3 (three) times daily.   aspirin EC 81 MG tablet Take 81 mg by mouth daily.   Black Cohosh 200 MG CAPS Take 2 capsules by mouth daily.   fexofenadine (ALLEGRA) 180 MG tablet Take 180 mg by mouth daily.   ibuprofen (ADVIL) 800 MG tablet Take 1 tablet (800 mg total) by mouth every 8 (eight) hours as needed.   lisinopril (ZESTRIL) 20 MG tablet Take 1 tablet (20 mg total) by mouth daily.   Loratadine (CLARITIN PO) Take by mouth. Once daily   naproxen (NAPROSYN) 500 MG tablet Take 1 tablet (500 mg total) by mouth 2 (two) times daily with a meal.   nitrofurantoin, macrocrystal-monohydrate, (MACROBID) 100 MG capsule Take 1 capsule (100 mg total) by mouth 2 (two) times daily for 7 days.     Allergies:   Ciprofloxacin   ROS:   Please see the history of present illness.     All other systems reviewed and are negative.  Labs/Other Tests and Data Reviewed:    Recent Labs: 08/06/2022: ALT 32; Hemoglobin 13.4; Platelets 289 08/28/2022: BUN 16; Creatinine, Ser 0.88; Potassium 4.3; Sodium 139   Recent Lipid Panel Lab Results  Component Value Date/Time   CHOL 214 (H) 08/28/2022 09:55 AM   TRIG 84 08/28/2022 09:55 AM   HDL 69 08/28/2022 09:55 AM   CHOLHDL 3.1 08/28/2022 09:55 AM   LDLCALC 130 (H) 08/28/2022 09:55 AM    Wt Readings from Last 3 Encounters:  08/23/22 239 lb (108.4 kg)  08/14/22  239 lb (108.4 kg)  08/06/22 236 lb (107 kg)     Objective:    Vital Signs:  LMP 03/15/2013 (Approximate) Comment: LMP @ age 61. Pt has Mirena IUD     ASSESSMENT & PLAN:   UTI Encouraged the patient to leave a urine sample to assess eradication of E. coli bacteria in her urine We will treat with Macrobid for 7 days Low suspicion of her Mirena IUD causing her urine to be foamy  Time:   Today, I have spent 15 minutes reviewing the chart, including problem list, medications, and with the patient with telehealth technology discussing the above problems.   Medication Adjustments/Labs and Tests Ordered: Current medicines are reviewed at length with the patient today.  Concerns regarding medicines are outlined above.   Tests Ordered: No orders of the defined types were placed in this encounter.   Medication Changes: Meds ordered this encounter  Medications   nitrofurantoin, macrocrystal-monohydrate, (MACROBID) 100 MG capsule    Sig: Take 1 capsule (100 mg total) by mouth 2 (two) times daily for 7 days.    Dispense:  14 capsule    Refill:  0     Note: This dictation was prepared with Dragon dictation along with smaller phrase technology. Similar sounding words can be transcribed inadequately or may not be corrected upon review. Any transcriptional errors that result from this process are unintentional.      Disposition:  Follow up  Signed, Gilmore Laroche, FNP  09/04/2022 10:27 PM     [facility]

## 2022-09-05 DIAGNOSIS — N3090 Cystitis, unspecified without hematuria: Secondary | ICD-10-CM | POA: Diagnosis not present

## 2022-09-05 LAB — POCT URINALYSIS DIP (CLINITEK)
Bilirubin, UA: NEGATIVE
Glucose, UA: NEGATIVE mg/dL
Ketones, POC UA: NEGATIVE mg/dL
Nitrite, UA: NEGATIVE
POC PROTEIN,UA: NEGATIVE
Spec Grav, UA: 1.015 (ref 1.010–1.025)
Urobilinogen, UA: 0.2 E.U./dL
pH, UA: 7 (ref 5.0–8.0)

## 2022-09-05 NOTE — Progress Notes (Signed)
Please encourage the patient to continue taking her prescribed antibiotic.  Her UA indicates that she still has a urinary tract infection.

## 2022-09-05 NOTE — Addendum Note (Signed)
Addended by: Herbie Saxon on: 09/05/2022 11:15 AM   Modules accepted: Orders

## 2022-09-09 ENCOUNTER — Other Ambulatory Visit: Payer: 59

## 2022-09-09 ENCOUNTER — Ambulatory Visit (HOSPITAL_COMMUNITY)
Admission: RE | Admit: 2022-09-09 | Discharge: 2022-09-09 | Disposition: A | Discharge status: Home / Self Care | Payer: 59 | Source: Ambulatory Visit | Attending: Family Medicine | Admitting: Family Medicine

## 2022-09-09 DIAGNOSIS — N201 Calculus of ureter: Secondary | ICD-10-CM

## 2022-09-09 DIAGNOSIS — Z1231 Encounter for screening mammogram for malignant neoplasm of breast: Secondary | ICD-10-CM | POA: Diagnosis present

## 2022-09-10 LAB — BASIC METABOLIC PANEL
BUN/Creatinine Ratio: 14 (ref 9–23)
BUN: 11 mg/dL (ref 6–24)
CO2: 20 mmol/L (ref 20–29)
Calcium: 10.2 mg/dL (ref 8.7–10.2)
Chloride: 102 mmol/L (ref 96–106)
Creatinine, Ser: 0.79 mg/dL (ref 0.57–1.00)
Glucose: 98 mg/dL (ref 70–99)
Potassium: 4.7 mmol/L (ref 3.5–5.2)
Sodium: 140 mmol/L (ref 134–144)
eGFR: 86 mL/min/{1.73_m2} (ref >59)

## 2022-09-11 ENCOUNTER — Telehealth: Payer: Self-pay | Admitting: Family Medicine

## 2022-09-11 ENCOUNTER — Encounter: Payer: Self-pay | Admitting: Family Medicine

## 2022-09-11 ENCOUNTER — Other Ambulatory Visit: Payer: Self-pay

## 2022-09-11 ENCOUNTER — Ambulatory Visit (INDEPENDENT_AMBULATORY_CARE_PROVIDER_SITE_OTHER): Payer: 59 | Admitting: Family Medicine

## 2022-09-11 VITALS — BP 143/83 | HR 74 | Resp 16 | Ht 62.0 in | Wt 233.0 lb

## 2022-09-11 DIAGNOSIS — N3090 Cystitis, unspecified without hematuria: Secondary | ICD-10-CM | POA: Diagnosis not present

## 2022-09-11 DIAGNOSIS — I10 Essential (primary) hypertension: Secondary | ICD-10-CM

## 2022-09-11 DIAGNOSIS — Z1211 Encounter for screening for malignant neoplasm of colon: Secondary | ICD-10-CM

## 2022-09-11 DIAGNOSIS — Z1152 Encounter for screening for COVID-19: Secondary | ICD-10-CM

## 2022-09-11 DIAGNOSIS — R8290 Unspecified abnormal findings in urine: Secondary | ICD-10-CM

## 2022-09-11 LAB — POCT URINALYSIS DIP (CLINITEK)
Bilirubin, UA: NEGATIVE
Glucose, UA: NEGATIVE mg/dL
Ketones, POC UA: NEGATIVE mg/dL
Nitrite, UA: NEGATIVE
POC PROTEIN,UA: NEGATIVE
Spec Grav, UA: 1.015 (ref 1.010–1.025)
Urobilinogen, UA: 0.2 E.U./dL
pH, UA: 6.5 (ref 5.0–8.0)

## 2022-09-11 MED ORDER — SULFAMETHOXAZOLE-TRIMETHOPRIM 800-160 MG PO TABS: 1.0000 | ORAL_TABLET | Freq: Two times a day (BID) | ORAL | 0 refills | Status: AC

## 2022-09-11 NOTE — Telephone Encounter (Signed)
Pt states she forgot to mention that she is getting info stating she is due for cologuard. Wants to know if nurse can call her about this?

## 2022-09-11 NOTE — Assessment & Plan Note (Signed)
She reports taking lisinopril 20 mg daily She denies headaches, dizziness, blurred vision She reports adherence with treatment regimen Encouraged low-sodium diet We will follow-up on BP in 2 weeks BP Readings from Last 3 Encounters:  09/11/22 (!) 143/83  08/23/22 134/81  08/14/22 131/83

## 2022-09-11 NOTE — Assessment & Plan Note (Signed)
UA shows a trace of leukocytes We will treat today with Bactrim for 7 days Encouraged to start drinking cranberry juice to decrease bacterial adhesion to the bladder wall and decrease UTI Encouraged to follow-up with her urologist on 09/19/2022

## 2022-09-11 NOTE — Telephone Encounter (Signed)
Order for cologuard placed.

## 2022-09-11 NOTE — Progress Notes (Signed)
Established Patient Office Visit  Subjective:  Patient ID: Sarah Davila, female    DOB: [birth year]  Age: 61 y.o. MRN: [id number]  CC:  Chief Complaint  Patient presents with   urine bubbly    Urine has been bubbly and looks like oil in it. No dysuria or uti symptoms. Started after she had the kidney stone on Oct 31    HPI Sarah Davila is a 61 y.o. female with past medical history of essential hypertension, insomnia, morbid obesity presents for f/u of  chronic medical conditions.  Urine Bubbly: She complains that her urine has been bubbly and looks like oil in it.  She denies dysuria, frequency, urgency, suprapubic pain,l and lower back pain.  The patient was treated on 08/23/2022 and 09/04/2022 for cystitis.  She reports following up with urology on 09/19/2022.  She denies fever and chills.  Past Medical History:  Diagnosis Date   Coronary artery disease    Hypertension    Kidney stone    Renal disorder    Sepsis ([place]) 2015   urinary    Past Surgical History:  Procedure Laterality Date   CYSTOSCOPY W/ URETERAL STENT PLACEMENT Bilateral 02/04/2019   Procedure: CYSTOSCOPY WITH RETROGRADE PYELOGRAM/URETERAL STENT PLACEMENT;  Surgeon: Irine Seal, MD;  Location: [facility];  Service: Urology;  Laterality: Bilateral;   CYSTOSCOPY/URETEROSCOPY/HOLMIUM LASER/STENT PLACEMENT Bilateral 02/16/2019   Procedure: CYSTOSCOPY BILATERAL URETEROSCOPY WITH HOLMIUM LASER AND STENTS PLACEMENT;  Surgeon: Irine Seal, MD;  Location: [facility];  Service: Urology;  Laterality: Bilateral;    Family History  Problem Relation Age of Onset   Hepatitis Mother    Cirrhosis Mother    Diabetes Father    Cancer Other     Social History   Socioeconomic History   Marital status: Divorced    Spouse name: Not on file   Number of children: Not on file   Years of education: Not on file   Highest education level: Not on file  Occupational History   Not on file  Tobacco Use   Smoking status: Never    Smokeless tobacco: Never  Vaping Use   Vaping Use: Never used  Substance and Sexual Activity   Alcohol use: Never   Drug use: Never   Sexual activity: Yes    Birth control/protection: I.U.D.  Other Topics Concern   Not on file  Social History Narrative   Not on file   Social Determinants of Health   Financial Resource Strain: Low Risk  (08/30/2020)   Overall Financial Resource Strain (CARDIA)    Difficulty of Paying Living Expenses: Not hard at all  Food Insecurity: No Food Insecurity (08/30/2020)   Hunger Vital Sign    Worried About Running Out of Food in the Last Year: Never true    Ran Out of Food in the Last Year: Never true  Transportation Needs: No Transportation Needs (08/30/2020)   PRAPARE - Hydrologist (Medical): No    Lack of Transportation (Non-Medical): No  Physical Activity: Sufficiently Active (08/30/2020)   Exercise Vital Sign    Days of Exercise per Week: 7 days    Minutes of Exercise per Session: 60 min  Stress: No Stress Concern Present (08/30/2020)   [place]    Feeling of Stress : Not at all  Social Connections: Socially Isolated (08/30/2020)   Social Connection and Isolation Panel [NHANES]    Frequency of Communication with Friends and Family:  More than three times a week    Frequency of Social Gatherings with Friends and Family: Never    Attends Religious Services: Never    Marine scientist or Organizations: No    Attends Archivist Meetings: Never    Marital Status: Divorced  Human resources officer Violence: Not At Risk (08/30/2020)   Humiliation, Afraid, Rape, and Kick questionnaire    Fear of Current or Ex-Partner: No    Emotionally Abused: No    Physically Abused: No    Sexually Abused: No    Outpatient Medications Prior to Visit  Medication Sig Dispense Refill   aspirin EC 81 MG tablet Take 81 mg by mouth daily.     Black Cohosh  200 MG CAPS Take 2 capsules by mouth daily.     fexofenadine (ALLEGRA) 180 MG tablet Take 180 mg by mouth daily.     ibuprofen (ADVIL) 800 MG tablet Take 1 tablet (800 mg total) by mouth every 8 (eight) hours as needed. 30 tablet 0   lisinopril (ZESTRIL) 20 MG tablet Take 1 tablet (20 mg total) by mouth daily. 90 tablet 3   Loratadine (CLARITIN PO) Take by mouth. Once daily     naproxen (NAPROSYN) 500 MG tablet Take 1 tablet (500 mg total) by mouth 2 (two) times daily with a meal. 60 tablet 5   ampicillin (PRINCIPEN) 500 MG capsule Take 1 capsule (500 mg total) by mouth 3 (three) times daily. 15 capsule 0   nitrofurantoin, macrocrystal-monohydrate, (MACROBID) 100 MG capsule Take 1 capsule (100 mg total) by mouth 2 (two) times daily for 7 days. (Patient not taking: Reported on 09/11/2022) 14 capsule 0   No facility-administered medications prior to visit.    Allergies  Allergen Reactions   Ciprofloxacin Other (See Comments)    Sever hot flashes    ROS Review of Systems  Constitutional:  Negative for fatigue and fever.  Eyes:  Negative for visual disturbance.  Respiratory:  Negative for chest tightness and shortness of breath.   Cardiovascular:  Negative for chest pain, palpitations and leg swelling.  Genitourinary:  Negative for dysuria, frequency and urgency.  Neurological:  Negative for dizziness and headaches.      Objective:    Physical Exam HENT:     Head: Normocephalic.     Right Ear: External ear normal.     Left Ear: External ear normal.  Cardiovascular:     Rate and Rhythm: Normal rate and regular rhythm.     Pulses: Normal pulses.     Heart sounds: Normal heart sounds.  Abdominal:     Tenderness: There is no abdominal tenderness. There is no right CVA tenderness or left CVA tenderness.  Musculoskeletal:     Cervical back: No rigidity.  Neurological:     Mental Status: She is alert.     BP (!) 143/83   Pulse 74   Resp 16   Ht _0  (1.575 m)   Wt 233 lb  (105.7 kg)   LMP 03/15/2013 (Approximate) Comment: LMP @ age 89. Pt has Mirena IUD  SpO2 94%   BMI 42.62 kg/m  Wt Readings from Last 3 Encounters:  09/11/22 233 lb (105.7 kg)  08/23/22 239 lb (108.4 kg)  08/14/22 239 lb (108.4 kg)    Lab Results  Component Value Date   TSH 1.840 09/04/2021   Lab Results  Component Value Date   WBC 11.3 (H) 08/06/2022   HGB 13.4 08/06/2022   HCT 41.7 08/06/2022  MCV 86.2 08/06/2022   PLT 289 08/06/2022   Lab Results  Component Value Date   NA 140 09/09/2022   K 4.7 09/09/2022   CO2 20 09/09/2022   GLUCOSE 98 09/09/2022   BUN 11 09/09/2022   CREATININE 0.79 09/09/2022   BILITOT 0.6 08/06/2022   ALKPHOS 111 08/06/2022   AST 23 08/06/2022   ALT 32 08/06/2022   PROT 7.1 08/06/2022   ALBUMIN 4.0 08/06/2022   CALCIUM 10.2 09/09/2022   ANIONGAP 8 08/06/2022   EGFR 86 09/09/2022   Lab Results  Component Value Date   CHOL 214 (H) 08/28/2022   Lab Results  Component Value Date   HDL 69 08/28/2022   Lab Results  Component Value Date   LDLCALC 130 (H) 08/28/2022   Lab Results  Component Value Date   TRIG 84 08/28/2022   Lab Results  Component Value Date   CHOLHDL 3.1 08/28/2022   Lab Results  Component Value Date   HGBA1C 5.6 08/28/2022      Assessment & Plan:  Cystitis Assessment & Plan: UA shows a trace of leukocytes We will treat today with Bactrim for 7 days Encouraged to start drinking cranberry juice to decrease bacterial adhesion to the bladder wall and decrease UTI Encouraged to follow-up with her urologist on 09/19/2022  Orders: -     Sulfamethoxazole-Trimethoprim; Take 1 tablet by mouth 2 (two) times daily for 7 days.  Dispense: 14 tablet; Refill: 0  Urine abnormality -     POCT URINALYSIS DIP (CLINITEK) -     Sulfamethoxazole-Trimethoprim; Take 1 tablet by mouth 2 (two) times daily for 7 days.  Dispense: 14 tablet; Refill: 0  Essential hypertension Assessment & Plan: She reports taking lisinopril 20  mg daily She denies headaches, dizziness, blurred vision She reports adherence with treatment regimen Encouraged low-sodium diet We will follow-up on BP in 2 weeks BP Readings from Last 3 Encounters:  09/11/22 (!) 143/83  08/23/22 134/81  08/14/22 131/83        Follow-up: Return in about 2 weeks (around 09/25/2022) for BP.   Alvira Monday, FNP

## 2022-09-11 NOTE — Patient Instructions (Addendum)
I appreciate the opportunity to provide care to you today!    Follow up:  2 weeks for BP  Please follow-up with your urologist on Thursday as scheduled and inform him of your current urinary symptoms  You will be treated today with Bactrim for 7 days  Please complete the full course of antibiotic  I recommend drinking cranberry juice as this prevent bacteria from sticking on the walls of the bladder which helps reduce urinary tract infection    Please continue to a heart-healthy diet and increase your physical activities. Try to exercise for at least three times a week.      It was a pleasure to see you and I look forward to continuing to work together on your health and well-being. Please do not hesitate to call the office if you need care or have questions about your care.   Have a wonderful day and week. With Gratitude, Gilmore Laroche MSN, FNP-BC

## 2022-09-12 ENCOUNTER — Telehealth: Payer: Self-pay

## 2022-09-12 NOTE — Telephone Encounter (Signed)
Patient calling to speak with a nurse regarding a UTI she has had after 4 rounds of antibiotics given by her primary doctor. Latest antibiotic can cause kidney stones. She wanted to verify with Dr. Annabell Howells that this will be ok to take.  Please advise.  Call back:  [phone]  Thanks, Sarah Davila

## 2022-09-13 ENCOUNTER — Telehealth: Payer: Self-pay

## 2022-09-13 NOTE — Telephone Encounter (Signed)
Patient voiced that she is on her third antix and she think there is something wrong with her kidneys.Made patient aware per Dr. Annabell Howells, have not seen that drug cause stones nor do I find evidence to suggest that it can cause stones.  There is a BP med called triamterene that can cause stones and that is similar in name to the trimethoprim in bactrim so maybe that is what she saw.  She should be fine to take it. Patient voiced understanding

## 2022-09-17 ENCOUNTER — Ambulatory Visit: Payer: 59 | Admitting: Family Medicine

## 2022-09-19 ENCOUNTER — Encounter: Payer: Self-pay | Admitting: Urology

## 2022-09-19 ENCOUNTER — Ambulatory Visit: Payer: 59 | Admitting: Urology

## 2022-09-19 VITALS — BP 145/79 | HR 116

## 2022-09-19 DIAGNOSIS — Z87440 Personal history of urinary (tract) infections: Secondary | ICD-10-CM

## 2022-09-19 DIAGNOSIS — N200 Calculus of kidney: Secondary | ICD-10-CM | POA: Diagnosis not present

## 2022-09-19 DIAGNOSIS — R8281 Pyuria: Secondary | ICD-10-CM | POA: Diagnosis not present

## 2022-09-19 MED ORDER — TRIMETHOPRIM 100 MG PO TABS: 100.0000 mg | ORAL_TABLET | Freq: Every day | ORAL | 5 refills | Status: DC

## 2022-09-19 NOTE — Progress Notes (Signed)
Subjective:  1. Nephrolithiasis   2. History of UTI   3. Pyuria      09/19/22: Sarah Davila returns today in f/u from recent stone passage and UTI.   She had an e.coli on culture on 10/3/123 and again on 08/23/22.   She was given bactrim by her PCP on 09/11/22 for a week.  She was previously given keflex, ampicillin and nitrofurantoin.   She has no dysuria but has bubbles in her urine.  She has no pneumoturia or fecaluria.  She has no gross hematuria but has microhematuria.  She has no further flank pain.  She has had no fever.    08/14/22: [name] is a 61 y.o. female who is seen for evaluation of right ureteral calculus. She presented to the emergency room on 08/06/2022 with right flank pain.  No fevers, chills, nausea, or vomiting.  CT imaging showed a 3 x 3 mm right proximal ureteral calculus with moderate right-sided hydronephrosis, and bilateral renal calculi.  Her urinalysis was suspicious for UTI.  Urine culture grew 50 K E. coli.  She was treated with Rocephin and cephalexin.  She passed the stone on 08/12/2022.  She has had no further flank pain.  No dysuria or gross hematuria.  She completed her antibiotics last night.  She has a prior history of nephrolithiasis and has undergone ureteroscopy with laser lithotripsy in 2020.  Stone analysis showed mixed calcium oxalate.  She was found to have an elevated uric acid of 7.9.  24-hour urine showed a elevated urine calcium and high pH.       ROS:  ROS:  A complete review of systems was performed.  All systems are negative except for pertinent findings as noted.   Review of Systems  Constitutional:  Positive for chills (monday).  Respiratory:  Positive for shortness of breath.   Cardiovascular:  Positive for chest pain.  Gastrointestinal:  Positive for nausea.  Musculoskeletal:  Positive for back pain and joint pain.  Skin:  Positive for itching.  Neurological:  Positive for weakness (generalized).    Allergies  Allergen  Reactions   Ciprofloxacin Other (See Comments)    Sever hot flashes    Outpatient Encounter Medications as of 09/19/2022  Medication Sig Note   trimethoprim (TRIMPEX) 100 MG tablet Take 1 tablet (100 mg total) by mouth at bedtime.    aspirin EC 81 MG tablet Take 81 mg by mouth daily.    Black Cohosh 200 MG CAPS Take 2 capsules by mouth daily.    fexofenadine (ALLEGRA) 180 MG tablet Take 180 mg by mouth daily.    ibuprofen (ADVIL) 800 MG tablet Take 1 tablet (800 mg total) by mouth every 8 (eight) hours as needed.    lisinopril (ZESTRIL) 20 MG tablet Take 1 tablet (20 mg total) by mouth daily.    Loratadine (CLARITIN PO) Take by mouth. Once daily    naproxen (NAPROSYN) 500 MG tablet Take 1 tablet (500 mg total) by mouth 2 (two) times daily with a meal. 08/23/2022: As needed   [EXPIRED] sulfamethoxazole-trimethoprim (BACTRIM DS) 800-160 MG tablet Take 1 tablet by mouth 2 (two) times daily for 7 days.    No facility-administered encounter medications on file as of 09/19/2022.    Past Medical History:  Diagnosis Date   Coronary artery disease    Hypertension    Kidney stone    Renal disorder    Sepsis ([facility]) 2015   urinary    Past Surgical History:  Procedure Laterality  Date   CYSTOSCOPY W/ URETERAL STENT PLACEMENT Bilateral 02/04/2019   Procedure: CYSTOSCOPY WITH RETROGRADE PYELOGRAM/URETERAL STENT PLACEMENT;  Surgeon: Bjorn Pippin, MD;  Location: [facility];  Service: Urology;  Laterality: Bilateral;   CYSTOSCOPY/URETEROSCOPY/HOLMIUM LASER/STENT PLACEMENT Bilateral 02/16/2019   Procedure: CYSTOSCOPY BILATERAL URETEROSCOPY WITH HOLMIUM LASER AND STENTS PLACEMENT;  Surgeon: Bjorn Pippin, MD;  Location: [facility];  Service: Urology;  Laterality: Bilateral;    Social History   Socioeconomic History   Marital status: Divorced    Spouse name: Not on file   Number of children: Not on file   Years of education: Not on file   Highest education level: Not on file  Occupational History   Not on  file  Tobacco Use   Smoking status: Never   Smokeless tobacco: Never  Vaping Use   Vaping Use: Never used  Substance and Sexual Activity   Alcohol use: Never   Drug use: Never   Sexual activity: Yes    Birth control/protection: I.U.D.  Other Topics Concern   Not on file  Social History Narrative   Not on file   Social Determinants of Health   Financial Resource Strain: Low Risk  (08/30/2020)   Overall Financial Resource Strain (CARDIA)    Difficulty of Paying Living Expenses: Not hard at all  Food Insecurity: No Food Insecurity (08/30/2020)   Hunger Vital Sign    Worried About Running Out of Food in the Last Year: Never true    Ran Out of Food in the Last Year: Never true  Transportation Needs: No Transportation Needs (08/30/2020)   PRAPARE - Administrator, Civil Service (Medical): No    Lack of Transportation (Non-Medical): No  Physical Activity: Sufficiently Active (08/30/2020)   Exercise Vital Sign    Days of Exercise per Week: 7 days    Minutes of Exercise per Session: 60 min  Stress: No Stress Concern Present (08/30/2020)   [employer] of Occupational Health - Occupational Stress Questionnaire    Feeling of Stress : Not at all  Social Connections: Socially Isolated (08/30/2020)   Social Connection and Isolation Panel [NHANES]    Frequency of Communication with Friends and Family: More than three times a week    Frequency of Social Gatherings with Friends and Family: Never    Attends Religious Services: Never    Database administrator or Organizations: No    Attends Banker Meetings: Never    Marital Status: Divorced  Catering manager Violence: Not At Risk (08/30/2020)   Humiliation, Afraid, Rape, and Kick questionnaire    Fear of Current or Ex-Partner: No    Emotionally Abused: No    Physically Abused: No    Sexually Abused: No    Family History  Problem Relation Age of Onset   Hepatitis Mother    Cirrhosis Mother    Diabetes  Father    Cancer Other        Objective: Vitals:   09/19/22 1352  BP: (!) 145/79  Pulse: (!) 116     Physical Exam Vitals reviewed.  Constitutional:      Appearance: Normal appearance.  Neurological:     Mental Status: She is alert.     Lab Results:  UA has 6-10 WBC but is o/w unremarkable.   Results for orders placed or performed in visit on 09/19/22 (from the past 24 hour(s))  Urinalysis, Routine w reflex microscopic     Status: Abnormal   Collection Time: 09/19/22  1:53 PM  Result Value Ref Range   Specific Gravity, UA 1.015 1.005 - 1.030   pH, UA 5.5 5.0 - 7.5   Color, UA Yellow Yellow   Appearance Ur Clear Clear   Leukocytes,UA 1+ (A) Negative   Protein,UA Negative Negative/Trace   Glucose, UA Negative Negative   Ketones, UA Negative Negative   RBC, UA Trace (A) Negative   Bilirubin, UA Negative Negative   Urobilinogen, Ur 0.2 0.2 - 1.0 mg/dL   Nitrite, UA Negative Negative   Microscopic Examination See below:    Narrative   Performed at:  [facility] [address] [place], [place]  [id number] Lab Director: Mina Marble MT, Phone:  [phone]  Microscopic Examination     Status: Abnormal   Collection Time: 09/19/22  1:53 PM   Urine  Result Value Ref Range   WBC, UA 6-10 (A) 0 - 5 /hpf   RBC, Urine 0-2 0 - 2 /hpf   Epithelial Cells (non renal) 0-10 0 - 10 /hpf   Bacteria, UA None seen None seen/Few   Narrative   Performed at:  [facility] [address] [place], [place]  [id number] Lab Director: Wilson, Phone:  [phone]    Recent cultures reviewed.  Studies/Results:   Results for orders placed during the hospital encounter of 09/12/20  CT Renal Stone Study  Narrative CLINICAL DATA:  Right flank pain.  EXAM: CT ABDOMEN AND PELVIS WITHOUT CONTRAST  TECHNIQUE: Multidetector CT imaging of the abdomen and pelvis was performed following the standard protocol without IV contrast.  COMPARISON:   CT February 02, 2019.  FINDINGS: Lower chest: No acute abnormality.  Hepatobiliary: No focal liver abnormality is seen. Similar appearance of multiple gallstones. No gallbladder wall thickening or inflammatory change. Mild hepatomegaly.  Pancreas: Unremarkable. No pancreatic ductal dilatation or surrounding inflammatory changes.  Spleen: Normal in size without focal abnormality.  Adrenals/Urinary Tract: Normal adrenal glands. There are multiple bilateral renal calculi. The largest is on the right and measures approximately 5 mm and is interpolar location. No hydronephrosis. No ureteral calculi identified. No ureteral nephrosis.  Stomach/Bowel: Stomach is within normal limits. Appendix appears normal. No evidence of bowel wall thickening, distention, or inflammatory changes. Stool throughout the colon.  Vascular/Lymphatic: Aortic atherosclerosis. No enlarged abdominal or pelvic lymph nodes.  Reproductive: Uterus and bilateral adnexa are unremarkable. IUD in place.  Other: No significant free fluid.  Musculoskeletal: Multilevel degenerative disc disease.  IMPRESSION: 1. Bilateral nephrolithiasis without evidence of hydronephrosis or ureteral calculus. 2. Cholelithiasis. 3. Moderate colonic stool burden.   Electronically Signed By: Margaretha Sheffield MD On: 09/12/2020 14:41    Assessment & Plan: Recurrent UTI.  I will get a post treatment culture today and discuss options going forward including 2 week f/u with a repeat UA at that time vs suppressive antibiotic with f/u in 1 month with cystoscopy.   She would like to go with suppression and cystoscopy which is a reasonable option.   I have sent TMP 100mg  po qhs.   Bilateral renal calculi.   She has small bilateral stones on her recent CT.   This is not the common picture I see with stones harboring infection, but if she continues to have issues with infection, we could consider bilateral ureteroscopy.      Meds ordered  this encounter  Medications   trimethoprim (TRIMPEX) 100 MG tablet    Sig: Take 1 tablet (100 mg total) by mouth at bedtime.    Dispense:  30 tablet    Refill:  5     Orders Placed This Encounter  Procedures   Urine Culture   Microscopic Examination   Urinalysis, Routine w reflex microscopic      Return in about 4 weeks (around 10/17/2022) for for cystoscopy.   CC: Alvira Monday, FNP      Irine Seal 09/20/2022

## 2022-09-20 LAB — URINALYSIS, ROUTINE W REFLEX MICROSCOPIC
Bilirubin, UA: NEGATIVE
Glucose, UA: NEGATIVE
Ketones, UA: NEGATIVE
Nitrite, UA: NEGATIVE
Protein,UA: NEGATIVE
Specific Gravity, UA: 1.015 (ref 1.005–1.030)
Urobilinogen, Ur: 0.2 mg/dL (ref 0.2–1.0)
pH, UA: 5.5 (ref 5.0–7.5)

## 2022-09-20 LAB — MICROSCOPIC EXAMINATION: Bacteria, UA: NONE SEEN

## 2022-09-22 LAB — URINE CULTURE

## 2022-09-23 ENCOUNTER — Telehealth: Payer: Self-pay

## 2022-09-23 NOTE — Telephone Encounter (Signed)
Made patient aware that her urine culture is negative. Patient voiced that she is on antibx but unsure if she need to continued to take the antibx or stop taking antibx. Made patient aware that I will se a task to Dr. Annabell Howells and someone will reach back out to her after the MD response. Patient voiced understanding

## 2022-09-23 NOTE — Telephone Encounter (Signed)
-----   Message from Bjorn Pippin, MD sent at 09/23/2022 12:55 PM EST ----- Her culture is negative for infection. ----- Message ----- From: Troy Sine, CMA Sent: 09/23/2022  12:50 PM EST To: Bjorn Pippin, MD  Please review

## 2022-09-25 NOTE — Telephone Encounter (Signed)
Made patient aware she should stay on her  nightly trimethoprim per Dr. Annabell Howells. Patient voiced understanding.

## 2022-10-03 ENCOUNTER — Ambulatory Visit: Payer: 59 | Admitting: Family Medicine

## 2022-10-09 ENCOUNTER — Ambulatory Visit: Payer: 59 | Admitting: Adult Health

## 2022-10-17 ENCOUNTER — Other Ambulatory Visit: Payer: 59 | Admitting: Urology

## 2022-10-22 ENCOUNTER — Ambulatory Visit (INDEPENDENT_AMBULATORY_CARE_PROVIDER_SITE_OTHER): Payer: 59 | Admitting: Family Medicine

## 2022-10-22 ENCOUNTER — Telehealth: Payer: Self-pay | Admitting: Family Medicine

## 2022-10-22 ENCOUNTER — Other Ambulatory Visit: Payer: Self-pay

## 2022-10-22 ENCOUNTER — Encounter: Payer: Self-pay | Admitting: Family Medicine

## 2022-10-22 VITALS — BP 130/80 | HR 101 | Ht 62.0 in | Wt 234.0 lb

## 2022-10-22 DIAGNOSIS — I10 Essential (primary) hypertension: Secondary | ICD-10-CM

## 2022-10-22 DIAGNOSIS — E7849 Other hyperlipidemia: Secondary | ICD-10-CM

## 2022-10-22 DIAGNOSIS — E559 Vitamin D deficiency, unspecified: Secondary | ICD-10-CM

## 2022-10-22 DIAGNOSIS — Z1211 Encounter for screening for malignant neoplasm of colon: Secondary | ICD-10-CM | POA: Diagnosis not present

## 2022-10-22 DIAGNOSIS — E038 Other specified hypothyroidism: Secondary | ICD-10-CM

## 2022-10-22 DIAGNOSIS — R410 Disorientation, unspecified: Secondary | ICD-10-CM

## 2022-10-22 DIAGNOSIS — R7301 Impaired fasting glucose: Secondary | ICD-10-CM

## 2022-10-22 DIAGNOSIS — M25562 Pain in left knee: Secondary | ICD-10-CM | POA: Diagnosis not present

## 2022-10-22 DIAGNOSIS — E538 Deficiency of other specified B group vitamins: Secondary | ICD-10-CM

## 2022-10-22 DIAGNOSIS — G8929 Other chronic pain: Secondary | ICD-10-CM

## 2022-10-22 MED ORDER — CAPSAICIN 0.025 % EX CREA: TOPICAL_CREAM | Freq: Two times a day (BID) | CUTANEOUS | 0 refills | Status: DC

## 2022-10-22 MED ORDER — LISINOPRIL 20 MG PO TABS: 20.0000 mg | ORAL_TABLET | Freq: Every day | ORAL | 3 refills | Status: DC

## 2022-10-22 MED ORDER — MELOXICAM 7.5 MG PO TABS: 7.5000 mg | ORAL_TABLET | Freq: Two times a day (BID) | ORAL | 0 refills | Status: DC | PRN

## 2022-10-22 NOTE — Assessment & Plan Note (Signed)
Patient reports three episode of being in a confused state in the past week She would like to be assessed for dementia No evidence of cognitive impairment or decrease in cognition noted on the mini-mental status examination and montreal cognitive assessment Will vitamin B12 level today

## 2022-10-22 NOTE — Telephone Encounter (Signed)
Refill sent.

## 2022-10-22 NOTE — Progress Notes (Signed)
Established Patient Office Visit  Subjective:  Patient ID: Sarah Davila, female    DOB: [birth year]  Age: 62 y.o. MRN: [id number]  CC:  Chief Complaint  Patient presents with   Follow-up    Pt reports doing well with bp, has been taking medication as prescribed. Pt has mental concerns she would like to discuss evaluation for dementia.    Knee Pain    Pt reports left knee pain since 10/06/2022.     HPI Sarah Davila is a 62 y.o. female with past medical history of essential hypertension, otitis media, and cystitis presents for f/u of  chronic medical conditions. For the details of today's visit, please refer to the assessment and plan.     Past Medical History:  Diagnosis Date   Coronary artery disease    Hypertension    Kidney stone    Renal disorder    Sepsis ([facility]) 2015   urinary    Past Surgical History:  Procedure Laterality Date   CYSTOSCOPY W/ URETERAL STENT PLACEMENT Bilateral 02/04/2019   Procedure: CYSTOSCOPY WITH RETROGRADE PYELOGRAM/URETERAL STENT PLACEMENT;  Surgeon: Bjorn Pippin, MD;  Location: [facility];  Service: Urology;  Laterality: Bilateral;   CYSTOSCOPY/URETEROSCOPY/HOLMIUM LASER/STENT PLACEMENT Bilateral 02/16/2019   Procedure: CYSTOSCOPY BILATERAL URETEROSCOPY WITH HOLMIUM LASER AND STENTS PLACEMENT;  Surgeon: Bjorn Pippin, MD;  Location: [facility];  Service: Urology;  Laterality: Bilateral;    Family History  Problem Relation Age of Onset   Hepatitis Mother    Cirrhosis Mother    Diabetes Father    Cancer Other     Social History   Socioeconomic History   Marital status: Divorced    Spouse name: Not on file   Number of children: Not on file   Years of education: Not on file   Highest education level: Not on file  Occupational History   Not on file  Tobacco Use   Smoking status: Never   Smokeless tobacco: Never  Vaping Use   Vaping Use: Never used  Substance and Sexual Activity   Alcohol use: Never   Drug use: Never   Sexual  activity: Yes    Birth control/protection: I.U.D.  Other Topics Concern   Not on file  Social History Narrative   Not on file   Social Determinants of Health   Financial Resource Strain: Low Risk  (08/30/2020)   Overall Financial Resource Strain (CARDIA)    Difficulty of Paying Living Expenses: Not hard at all  Food Insecurity: No Food Insecurity (08/30/2020)   Hunger Vital Sign    Worried About Running Out of Food in the Last Year: Never true    Ran Out of Food in the Last Year: Never true  Transportation Needs: No Transportation Needs (08/30/2020)   PRAPARE - Administrator, Civil Service (Medical): No    Lack of Transportation (Non-Medical): No  Physical Activity: Sufficiently Active (08/30/2020)   Exercise Vital Sign    Days of Exercise per Week: 7 days    Minutes of Exercise per Session: 60 min  Stress: No Stress Concern Present (08/30/2020)   [employer] of Occupational Health - Occupational Stress Questionnaire    Feeling of Stress : Not at all  Social Connections: Socially Isolated (08/30/2020)   Social Connection and Isolation Panel [NHANES]    Frequency of Communication with Friends and Family: More than three times a week    Frequency of Social Gatherings with Friends and Family: Never    Attends Religious Services:  Never    Active Member of Clubs or Organizations: No    Attends Archivist Meetings: Never    Marital Status: Divorced  Intimate Partner Violence: Not At Risk (08/30/2020)   Humiliation, Afraid, Rape, and Kick questionnaire    Fear of Current or Ex-Partner: No    Emotionally Abused: No    Physically Abused: No    Sexually Abused: No    Outpatient Medications Prior to Visit  Medication Sig Dispense Refill   aspirin EC 81 MG tablet Take 81 mg by mouth daily.     Black Cohosh 200 MG CAPS Take 2 capsules by mouth daily.     fexofenadine (ALLEGRA) 180 MG tablet Take 180 mg by mouth daily.     Loratadine (CLARITIN PO) Take by  mouth. Once daily     naproxen (NAPROSYN) 500 MG tablet Take 1 tablet (500 mg total) by mouth 2 (two) times daily with a meal. 60 tablet 5   trimethoprim (TRIMPEX) 100 MG tablet Take 1 tablet (100 mg total) by mouth at bedtime. 30 tablet 5   ibuprofen (ADVIL) 800 MG tablet Take 1 tablet (800 mg total) by mouth every 8 (eight) hours as needed. 30 tablet 0   lisinopril (ZESTRIL) 20 MG tablet Take 1 tablet (20 mg total) by mouth daily. 90 tablet 3   No facility-administered medications prior to visit.    Allergies  Allergen Reactions   Ciprofloxacin Other (See Comments)    Sever hot flashes    ROS Review of Systems  Constitutional:  Negative for chills and fever.  Eyes:  Negative for visual disturbance.  Respiratory:  Negative for chest tightness and shortness of breath.   Musculoskeletal:        Left knee pain  Neurological:  Negative for dizziness and headaches.  Psychiatric/Behavioral:  Negative for suicidal ideas.       Objective:    Physical Exam HENT:     Head: Normocephalic.     Mouth/Throat:     Mouth: Mucous membranes are moist.  Cardiovascular:     Rate and Rhythm: Normal rate.     Heart sounds: Normal heart sounds.  Pulmonary:     Effort: Pulmonary effort is normal.     Breath sounds: Normal breath sounds.  Musculoskeletal:     Comments: Range of motion of the left knee is intact No signs of effusion or inflammation noted No underlying skin changes  Neurological:     Mental Status: She is alert.     BP 130/80   Pulse (!) 101   Ht 5\' 2"  (1.575 m)   Wt 234 lb 0.3 oz (106.2 kg)   LMP 03/15/2013 (Approximate) Comment: LMP @ age 13. Pt has Mirena IUD  SpO2 97%   BMI 42.80 kg/m  Wt Readings from Last 3 Encounters:  10/22/22 234 lb 0.3 oz (106.2 kg)  09/11/22 233 lb (105.7 kg)  08/23/22 239 lb (108.4 kg)    Lab Results  Component Value Date   TSH 1.840 09/04/2021   Lab Results  Component Value Date   WBC 11.3 (H) 08/06/2022   HGB 13.4 08/06/2022    HCT 41.7 08/06/2022   MCV 86.2 08/06/2022   PLT 289 08/06/2022   Lab Results  Component Value Date   NA 140 09/09/2022   K 4.7 09/09/2022   CO2 20 09/09/2022   GLUCOSE 98 09/09/2022   BUN 11 09/09/2022   CREATININE 0.79 09/09/2022   BILITOT 0.6 08/06/2022   ALKPHOS 111 08/06/2022  AST 23 08/06/2022   ALT 32 08/06/2022   PROT 7.1 08/06/2022   ALBUMIN 4.0 08/06/2022   CALCIUM 10.2 09/09/2022   ANIONGAP 8 08/06/2022   EGFR 86 09/09/2022   Lab Results  Component Value Date   CHOL 214 (H) 08/28/2022   Lab Results  Component Value Date   HDL 69 08/28/2022   Lab Results  Component Value Date   LDLCALC 130 (H) 08/28/2022   Lab Results  Component Value Date   TRIG 84 08/28/2022   Lab Results  Component Value Date   CHOLHDL 3.1 08/28/2022   Lab Results  Component Value Date   HGBA1C 5.6 08/28/2022      Assessment & Plan:  Delirium Assessment & Plan: Patient reports three episode of being in a confused state in the past week She would like to be assessed for dementia No evidence of cognitive impairment or decrease in cognition noted on the mini-mental status examination and montreal cognitive assessment Will vitamin B12 level today     Chronic pain of left knee Assessment & Plan: Reports history of arthritis in her knees No recent trauma or injury to the affected knee She reports taking naproxen 500 mg twice daily with minimal relief of her symptoms Pain is aggravated with movement and is relieved with rest Of note, patient BMI is 42.80 She reports following up with orthopedics but has not recently Encouraged patient to follow-up with orthopedics as needed Will discontinue naproxen and start patient on meloxicam 7.5 mg twice daily Capsaicin topical cream ordered  Orders: -     Capsaicin; Apply topically 2 (two) times daily.  Dispense: 60 g; Refill: 0 -     Meloxicam; Take 1 tablet (7.5 mg total) by mouth 2 (two) times daily as needed for pain.   Dispense: 60 tablet; Refill: 0  Essential hypertension -     CMP14+EGFR -     CBC with Differential/Platelet  Colon cancer screening -     Fecal occult blood, imunochemical  IFG (impaired fasting glucose) -     Hemoglobin A1c  Vitamin D deficiency -     VITAMIN D 25 Hydroxy (Vit-D Deficiency, Fractures)  Other specified hypothyroidism -     TSH + free T4  Other hyperlipidemia -     Lipid panel  Vitamin B12 deficiency -     Vitamin B12    Follow-up: Return in about 3 months (around 01/21/2023).   Alvira Monday, FNP

## 2022-10-22 NOTE — Patient Instructions (Signed)
I appreciate the opportunity to provide care to you today!    Follow up:  3 months  Labs: please stop by the lab during the week to get your blood drawn (CBC, CMP, TSH, Lipid profile, HgA1c, Vit D)  Please pick up your medications at the pharmacy   Referrals today- please follow up with orthopedics concerning your left knee pain   Please continue to a heart-healthy diet and increase your physical activities. Try to exercise for 34mins at least five times a week.      It was a pleasure to see you and I look forward to continuing to work together on your health and well-being. Please do not hesitate to call the office if you need care or have questions about your care.   Have a wonderful day and week. With Gratitude, Alvira Monday MSN, FNP-BC

## 2022-10-22 NOTE — Telephone Encounter (Signed)
Pt was just seen & forgot to ask for a refill on her bp medication.    Prescription Request  10/22/2022  Is this a "Controlled Substance" medicine? No  LOV: 10/22/2022  What is the name of the medication or equipment? lisinopril (ZESTRIL) 20 MG tablet   Have you contacted your pharmacy to request a refill? No   Which pharmacy would you like this sent to?  [facility], [place] - [id number] [place] [address] Phone: [phone] Fax: [phone]    Patient notified that their request is being sent to the clinical staff for review and that they should receive a response within 2 business days.   Please advise at [facility] [phone]

## 2022-10-22 NOTE — Assessment & Plan Note (Signed)
Reports history of arthritis in her knees No recent trauma or injury to the affected knee She reports taking naproxen 500 mg twice daily with minimal relief of her symptoms Pain is aggravated with movement and is relieved with rest Of note, patient BMI is 42.80 She reports following up with orthopedics but has not recently Encouraged patient to follow-up with orthopedics as needed Will discontinue naproxen and start patient on meloxicam 7.5 mg twice daily Capsaicin topical cream ordered

## 2022-10-24 LAB — CMP14+EGFR
ALT: 30 IU/L (ref 0–32)
AST: 20 IU/L (ref 0–40)
Albumin/Globulin Ratio: 1.7 (ref 1.2–2.2)
Albumin: 4.4 g/dL (ref 3.8–4.9)
Alkaline Phosphatase: 151 IU/L — ABNORMAL HIGH (ref 44–121)
BUN/Creatinine Ratio: 26 — ABNORMAL HIGH (ref 9–23)
BUN: 16 mg/dL (ref 6–24)
Bilirubin Total: 0.3 mg/dL (ref 0.0–1.2)
CO2: 20 mmol/L (ref 20–29)
Calcium: 9.7 mg/dL (ref 8.7–10.2)
Chloride: 105 mmol/L (ref 96–106)
Creatinine, Ser: 0.62 mg/dL (ref 0.57–1.00)
Globulin, Total: 2.6 g/dL (ref 1.5–4.5)
Glucose: 89 mg/dL (ref 70–99)
Potassium: 4.5 mmol/L (ref 3.5–5.2)
Sodium: 140 mmol/L (ref 134–144)
Total Protein: 7 g/dL (ref 6.0–8.5)
eGFR: 103 mL/min/{1.73_m2} (ref >59)

## 2022-10-24 LAB — CBC WITH DIFFERENTIAL/PLATELET
Basophils Absolute: 0.1 10*3/uL (ref 0.0–0.2)
Basos: 1 %
EOS (ABSOLUTE): 0.3 10*3/uL (ref 0.0–0.4)
Eos: 4 %
Hematocrit: 42.1 % (ref 34.0–46.6)
Hemoglobin: 13.7 g/dL (ref 11.1–15.9)
Immature Grans (Abs): 0 10*3/uL (ref 0.0–0.1)
Immature Granulocytes: 0 %
Lymphocytes Absolute: 2.8 10*3/uL (ref 0.7–3.1)
Lymphs: 35 %
MCH: 27.4 pg (ref 26.6–33.0)
MCHC: 32.5 g/dL (ref 31.5–35.7)
MCV: 84 fL (ref 79–97)
Monocytes Absolute: 0.5 10*3/uL (ref 0.1–0.9)
Monocytes: 7 %
Neutrophils Absolute: 4.2 10*3/uL (ref 1.4–7.0)
Neutrophils: 53 %
Platelets: 354 10*3/uL (ref 150–450)
RBC: 5 x10E6/uL (ref 3.77–5.28)
RDW: 13.9 % (ref 11.7–15.4)
WBC: 7.9 10*3/uL (ref 3.4–10.8)

## 2022-10-24 LAB — LIPID PANEL
Chol/HDL Ratio: 3.5 ratio (ref 0.0–4.4)
Cholesterol, Total: 216 mg/dL — ABNORMAL HIGH (ref 100–199)
HDL: 62 mg/dL (ref >39)
LDL Chol Calc (NIH): 134 mg/dL — ABNORMAL HIGH (ref 0–99)
Triglycerides: 112 mg/dL (ref 0–149)
VLDL Cholesterol Cal: 20 mg/dL (ref 5–40)

## 2022-10-24 LAB — TSH+FREE T4
Free T4: 1.14 ng/dL (ref 0.82–1.77)
TSH: 1.35 u[IU]/mL (ref 0.450–4.500)

## 2022-10-24 LAB — HEMOGLOBIN A1C
Est. average glucose Bld gHb Est-mCnc: 114 mg/dL
Hgb A1c MFr Bld: 5.6 % (ref 4.8–5.6)

## 2022-10-24 LAB — VITAMIN D 25 HYDROXY (VIT D DEFICIENCY, FRACTURES): Vit D, 25-Hydroxy: 16.3 ng/mL — ABNORMAL LOW (ref 30.0–100.0)

## 2022-10-25 ENCOUNTER — Other Ambulatory Visit: Payer: Self-pay | Admitting: Family Medicine

## 2022-10-25 DIAGNOSIS — E559 Vitamin D deficiency, unspecified: Secondary | ICD-10-CM

## 2022-10-25 MED ORDER — VITAMIN D (ERGOCALCIFEROL) 1.25 MG (50000 UNIT) PO CAPS: 50000.0000 [IU] | ORAL_CAPSULE | ORAL | 1 refills | Status: DC

## 2022-10-29 ENCOUNTER — Ambulatory Visit (INDEPENDENT_AMBULATORY_CARE_PROVIDER_SITE_OTHER): Payer: 59

## 2022-10-29 ENCOUNTER — Ambulatory Visit: Payer: 59 | Admitting: Orthopaedic Surgery

## 2022-10-29 ENCOUNTER — Encounter: Payer: Self-pay | Admitting: Orthopaedic Surgery

## 2022-10-29 VITALS — BP 139/74 | HR 63 | Ht 62.0 in | Wt 234.0 lb

## 2022-10-29 DIAGNOSIS — G8929 Other chronic pain: Secondary | ICD-10-CM | POA: Diagnosis not present

## 2022-10-29 DIAGNOSIS — M25562 Pain in left knee: Secondary | ICD-10-CM

## 2022-10-29 MED ORDER — MELOXICAM 7.5 MG PO TABS: 7.5000 mg | ORAL_TABLET | Freq: Two times a day (BID) | ORAL | 5 refills | Status: DC | PRN

## 2022-10-29 NOTE — Patient Instructions (Addendum)
 ROV 2 WEEKS LT KNEE  CONTINUE TAKING THE MELOXICAM TWICE DAILY  Aspercreme, Biofreeze, Blue Emu or Voltaren Gel over the counter 2-3 times daily. Rub into area well each use for best results.  As the weather changes and gets cooler, you may notice you are affected more. You may have more pain in your joints. This is normal. Dress warmly and make sure that area is covered well.    Dr.Keeling is here all day on Tuesdays. He is here half a day on Wednesday mornings, and Thursday mornings. If you need anything such as a medication refill, please either call BEFORE the end of the day on [facility] or send a message through [place]. Your pharmacy can send a refill request for you. Calling by the end of the day on [facility] allows [place] time to send Dr.Keeling the request and for him to respond before he leaves on Thursdays.   My name is  and I assist [place]. If you need anything before your next appointment, please do not hesitate to call the office at [phone] and ask to leave a message for me. I will respond within 24-48 business hours.

## 2022-10-29 NOTE — Progress Notes (Signed)
My left knee hurts.  She has had pain in the left knee for a while but particularly since January 6.  She has no trauma.  It swells, it pops.  She has no redness.  She tried aleve but she had some Mobic 7.5 and tried that last week and her knee has improved.  Left knee has slight effusion, crepitus, medial pain, ROM 0 to 105, slight limp left, stable, no distal edema, NV intact.  X-rays were done of the left knee, reported separately.  Encounter Diagnosis  Name Primary?   Chronic pain of left knee Yes   I will keep her on the Mobic as it helps.  I have called in refill.  Return in two weeks.  Call if any problem.  Precautions discussed.  Electronically Signed Sanjuana Kava, MD 1/23/20249:23 AM

## 2022-10-31 LAB — FECAL OCCULT BLOOD, IMMUNOCHEMICAL: Fecal Occult Bld: NEGATIVE

## 2022-11-10 ENCOUNTER — Other Ambulatory Visit: Payer: Self-pay | Admitting: Family Medicine

## 2022-11-12 ENCOUNTER — Encounter: Payer: Self-pay | Admitting: Orthopaedic Surgery

## 2022-11-12 ENCOUNTER — Ambulatory Visit: Payer: 59 | Admitting: Adult Health

## 2022-11-12 ENCOUNTER — Ambulatory Visit: Payer: 59 | Admitting: Orthopaedic Surgery

## 2022-11-12 VITALS — BP 134/74 | Ht 62.0 in | Wt 234.0 lb

## 2022-11-12 DIAGNOSIS — M25562 Pain in left knee: Secondary | ICD-10-CM

## 2022-11-12 DIAGNOSIS — G8929 Other chronic pain: Secondary | ICD-10-CM | POA: Diagnosis not present

## 2022-11-12 NOTE — Patient Instructions (Signed)
The most common side effect from the Meloxicam (Mobic) is GI side effects. If you are concerned about taking the medication, stop taking on of the doses per day (the evening dose) for a week and see how you do with that.   The winter/cold weather can make arthritis pain worse. You may feel better during the spring and summer.

## 2022-11-12 NOTE — Progress Notes (Signed)
My knee is better.  She is taking the Mobic and doing well with it.  She has less swelling and pain.  She has no new trauma.  Left knee has just slight effusion, crepitus, ROM 0 to 110, stable, no distal edema, NV intact.  Encounter Diagnosis  Name Primary?   Chronic pain of left knee Yes   Try to taper down on the Mobic to one a day if tolerated.  Return in one month.  Call if any problem.  Precautions discussed.  Electronically Signed Sanjuana Kava, MD 2/6/20242:46 PM

## 2022-11-25 ENCOUNTER — Telehealth: Payer: Self-pay

## 2022-11-25 NOTE — Telephone Encounter (Signed)
Patient needing refills on trimethoprim (TRIMPEX) 100 MG tablet

## 2022-11-25 NOTE — Telephone Encounter (Signed)
Patient is aware that rx for Trimpex has refill and I contact pharmacy for patient to get medication refill. Patient voiced understanding

## 2022-11-27 ENCOUNTER — Other Ambulatory Visit: Payer: Self-pay

## 2022-11-27 ENCOUNTER — Telehealth: Payer: Self-pay

## 2022-11-27 ENCOUNTER — Emergency Department (HOSPITAL_COMMUNITY)
Admission: EM | Admit: 2022-11-27 | Discharge: 2022-11-27 | Disposition: A | Discharge status: Home / Self Care | Payer: Medicaid Other | Attending: Emergency Medicine | Admitting: Emergency Medicine

## 2022-11-27 ENCOUNTER — Encounter (HOSPITAL_COMMUNITY): Payer: Self-pay | Admitting: Emergency Medicine

## 2022-11-27 DIAGNOSIS — Z87440 Personal history of urinary (tract) infections: Secondary | ICD-10-CM

## 2022-11-27 DIAGNOSIS — Z7982 Long term (current) use of aspirin: Secondary | ICD-10-CM | POA: Insufficient documentation

## 2022-11-27 DIAGNOSIS — H5712 Ocular pain, left eye: Secondary | ICD-10-CM | POA: Diagnosis present

## 2022-11-27 DIAGNOSIS — R8281 Pyuria: Secondary | ICD-10-CM

## 2022-11-27 DIAGNOSIS — N201 Calculus of ureter: Secondary | ICD-10-CM

## 2022-11-27 DIAGNOSIS — H5789 Other specified disorders of eye and adnexa: Secondary | ICD-10-CM

## 2022-11-27 DIAGNOSIS — N200 Calculus of kidney: Secondary | ICD-10-CM

## 2022-11-27 DIAGNOSIS — S0502XA Injury of conjunctiva and corneal abrasion without foreign body, left eye, initial encounter: Secondary | ICD-10-CM | POA: Diagnosis not present

## 2022-11-27 DIAGNOSIS — X58XXXA Exposure to other specified factors, initial encounter: Secondary | ICD-10-CM | POA: Diagnosis not present

## 2022-11-27 MED ORDER — TETRACAINE HCL 0.5 % OP SOLN
2.0000 [drp] | Freq: Once | OPHTHALMIC | Status: AC
  Administered 2022-11-27: 2 [drp] via OPHTHALMIC
  Filled 2022-11-27: qty 4

## 2022-11-27 MED ORDER — FLUORESCEIN SODIUM 1 MG OP STRP
1.0000 | ORAL_STRIP | Freq: Once | OPHTHALMIC | Status: AC
  Administered 2022-11-27: 1 via OPHTHALMIC
  Filled 2022-11-27: qty 1

## 2022-11-27 MED ORDER — TOBRAMYCIN 0.3 % OP SOLN
2.0000 [drp] | Freq: Once | OPHTHALMIC | Status: AC
  Administered 2022-11-27: 2 [drp] via OPHTHALMIC
  Filled 2022-11-27: qty 5

## 2022-11-27 MED ORDER — SULFAMETHOXAZOLE-TRIMETHOPRIM 800-160 MG PO TABS: ORAL_TABLET | ORAL | 5 refills | Status: DC

## 2022-11-27 MED ORDER — NEOMYCIN-POLYMYXIN-DEXAMETH 3.5-10000-0.1 OP SUSP
2.0000 [drp] | Freq: Once | OPHTHALMIC | Status: DC
  Filled 2022-11-27: qty 5

## 2022-11-27 NOTE — ED Notes (Signed)
Signature pad broken, pt verbalized MSE

## 2022-11-27 NOTE — Discharge Instructions (Signed)
Avoid straining your eyes, were sunglasses.  Apply 1 drop of the tobramycin to your left eye every 4 hours.  Please follow-up with your eye doctor tomorrow for recheck.  As discussed, you may also follow-up with the ophthalmologist listed, but will have to pay for the visit upfront as he does not honor Art therapist.  Return to the emergency department for any new or worsening symptoms.

## 2022-11-27 NOTE — ED Triage Notes (Signed)
Pt c/o left eye redness with clear drainage x 8 days

## 2022-11-27 NOTE — Telephone Encounter (Signed)
Patient call in today and voiced that her insurance will not cover Trimpex and want to know if something else can be prescribe. Patient is aware that I will seen a task to Dr. Jeffie Pollock and someone will reach back. Patient voiced understanding

## 2022-11-27 NOTE — Telephone Encounter (Signed)
Left vm for return call

## 2022-11-28 NOTE — Telephone Encounter (Signed)
Patient is aware that She can have bactrim ds 1/2 tab po qhs #30 with 5 refills. Patient voiced understanding

## 2022-11-29 NOTE — ED Provider Notes (Signed)
Sarah Davila   CSN: [id number] Arrival date & time: 11/27/22  4098     History  Chief Complaint  Patient presents with   Eye Problem    Sarah Davila is a 62 y.o. female.   Eye Problem Associated symptoms: photophobia and redness   Associated symptoms: no headaches, no itching, no numbness, no vomiting and no weakness        Sarah Davila is a 62 y.o. female who presents to the Emergency Department complaining of pain, tearing and redness of the left eye x 8 days.  She has been experiencing worsening pain with exposure to bright lights and constant watering of her eye.  She denies any crusting or swelling of her eye.  She denies headache, dizziness, or visual change.  States her symptoms began after cleaning a rug and she believes that she got something in her eye. She does not  wear contacts. No facial pain or swelling    Home Medications Prior to Admission medications   Medication Sig Start Date End Date Taking? Authorizing Provider  aspirin EC 81 MG tablet Take 81 mg by mouth daily.    [provider]  Black Cohosh 200 MG CAPS Take 2 capsules by mouth daily.    [provider]  capsaicin (ZOSTRIX) 0.025 % cream Apply topically 2 (two) times daily. 10/22/22   Gilmore Laroche, FNP  fexofenadine (ALLEGRA) 180 MG tablet Take 180 mg by mouth daily.    [provider]  lisinopril (ZESTRIL) 20 MG tablet Take 1 tablet (20 mg total) by mouth daily. 10/22/22   Gilmore Laroche, FNP  Loratadine (CLARITIN PO) Take by mouth. Once daily    [provider]  meloxicam (MOBIC) 7.5 MG tablet Take 1 tablet (7.5 mg total) by mouth 2 (two) times daily as needed for pain. 10/29/22   Darreld Mclean, MD  sulfamethoxazole-trimethoprim (BACTRIM DS) 800-160 MG tablet Take 1/2 tab po qhs 11/27/22   Bjorn Pippin, MD  trimethoprim (TRIMPEX) 100 MG tablet Take 1 tablet (100 mg total) by mouth at bedtime.  09/19/22   Bjorn Pippin, MD  Vitamin D, Ergocalciferol, (DRISDOL) 1.25 MG (50000 UNIT) CAPS capsule Take 1 capsule (50,000 Units total) by mouth every 7 (seven) days. 10/25/22   Gilmore Laroche, FNP      Allergies    Ciprofloxacin    Review of Systems   Review of Systems  Constitutional:  Negative for chills and fever.  HENT:  Negative for congestion.   Eyes:  Positive for photophobia, pain and redness. Negative for itching.       Excessive tearing of left eye  Respiratory:  Negative for cough and shortness of breath.   Gastrointestinal:  Negative for vomiting.  Genitourinary:  Negative for dysuria.  Musculoskeletal:  Negative for arthralgias, myalgias and neck pain.  Skin:  Negative for rash.  Neurological:  Negative for dizziness, syncope, speech difficulty, weakness, numbness and headaches.    Physical Exam Updated Vital Signs BP 112/65 (BP Location: Right Arm)   Pulse 64   Temp 98.2 F (36.8 C) (Oral)   Resp 18   Ht 5\' 2"  (1.575 m)   Wt 103.9 kg   LMP 03/15/2013 (Approximate) Comment: LMP @ age 28. Pt has Mirena IUD  SpO2 100%   BMI 41.88 kg/m  Physical Exam Vitals and nursing Davila reviewed.  Constitutional:      General: She is not in acute distress.    Appearance: Normal  appearance. She is not toxic-appearing.  HENT:     Head: Atraumatic.  Eyes:     General: Lids are normal. Lids are everted, no foreign bodies appreciated. Vision grossly intact. Gaze aligned appropriately.        Left eye: No foreign body, discharge or hordeolum.     Intraocular pressure: Left eye pressure is 14 mmHg. Measurements were taken using a handheld tonometer.    Extraocular Movements: Extraocular movements intact.     Right eye: Normal extraocular motion.     Left eye: Normal extraocular motion.     Conjunctiva/sclera:     Left eye: Left conjunctiva is injected. No chemosis, exudate or hemorrhage.    Pupils: Pupils are equal, round, and reactive to light.     Left eye: Pupil is  reactive and not sluggish. Corneal abrasion and fluorescein uptake present. Seidel exam negative.    Funduscopic exam:       Left eye: No papilledema.     Slit lamp exam:    Left eye: Photophobia present. No corneal flare, corneal ulcer, foreign body or hyphema.     Comments: Several pin point areas of fluorescein uptake to the lateral cornea.  No FB's seen  Cardiovascular:     Rate and Rhythm: Normal rate and regular rhythm.     Pulses: Normal pulses.  Pulmonary:     Effort: Pulmonary effort is normal.     Breath sounds: Normal breath sounds.  Musculoskeletal:        General: Normal range of motion.     Cervical back: Normal range of motion. No rigidity.  Lymphadenopathy:     Cervical: No cervical adenopathy.  Skin:    General: Skin is warm.     Findings: No rash.  Neurological:     General: No focal deficit present.     Mental Status: She is alert.     Sensory: No sensory deficit.     Motor: No weakness.     ED Results / Procedures / Treatments   Labs (all labs ordered are listed, but only abnormal results are displayed) Labs Reviewed - No data to display  EKG None  Radiology No results found.  Procedures Procedures    Medications Ordered in ED Medications  tetracaine (PONTOCAINE) 0.5 % ophthalmic solution 2 drop (2 drops Left Eye Given 11/27/22 1230)  fluorescein ophthalmic strip 1 strip (1 strip Left Eye Given 11/27/22 1317)  tobramycin (TOBREX) 0.3 % ophthalmic solution 2 drop (2 drops Left Eye Given 11/27/22 1316)    ED Course/ Medical Decision Making/ A&P                             Medical Decision Making Pt with possible FB of left eye.  Sx x 8 days photophobia w/o visual change.   Diff of red eye is broad.  FB, corneal abrasion, uveitis, iritis, glacoma, retinal detachment felt less likely with absence of visual channge  Amount and/or Complexity of Data Reviewed Discussion of management or test interpretation with external provider(s): Pin pont  corneal abrasions on exam.  No FB seen.    Reassuring IOP.  Discussed finding with ophthalmology, Dr. Allena Katz  who recommends Maxitrol drops and he will see pt in his office.   Risk Prescription drug management.           Final Clinical Impression(s) / ED Diagnoses Final diagnoses:  Red eye  Abrasion of left cornea, initial encounter  Rx / DC Orders ED Discharge Orders     None         Pauline Aus, PA-C 11/29/22 1441    Bethann Berkshire, MD 11/30/22 [phone]

## 2022-12-05 ENCOUNTER — Encounter: Payer: Self-pay | Admitting: Radiology

## 2022-12-10 ENCOUNTER — Ambulatory Visit: Payer: Medicaid Other | Admitting: Orthopaedic Surgery

## 2022-12-10 ENCOUNTER — Encounter: Payer: Self-pay | Admitting: Orthopaedic Surgery

## 2022-12-10 VITALS — BP 129/74 | HR 82 | Ht 62.0 in | Wt 227.6 lb

## 2022-12-10 DIAGNOSIS — M2550 Pain in unspecified joint: Secondary | ICD-10-CM | POA: Diagnosis not present

## 2022-12-10 DIAGNOSIS — M25562 Pain in left knee: Secondary | ICD-10-CM

## 2022-12-10 DIAGNOSIS — M25511 Pain in right shoulder: Secondary | ICD-10-CM

## 2022-12-10 DIAGNOSIS — M25531 Pain in right wrist: Secondary | ICD-10-CM

## 2022-12-10 DIAGNOSIS — G8929 Other chronic pain: Secondary | ICD-10-CM

## 2022-12-10 MED ORDER — MELOXICAM 7.5 MG PO TABS: ORAL_TABLET | ORAL | 5 refills | Status: DC

## 2022-12-10 NOTE — Progress Notes (Signed)
My knee is better but my right wrist hurts. My shoulders are tender.  She has been able to take one Mobic 7.5 daily and do well.  She tried without it and had more pain.    She has developed pain of the right lateral wrist.  It bothers her more at night.  She has some swelling, no redness, no trauma.  I asked about family history of gout, and she said no.  I will get serum uric acid level and give cock-up splint.  Left knee has some swelling, crepitus but ROM 0 to 110.  Stable.  Good gait.  Right wrist with lateral swelling over distal ulna and proximal and laterally.  Tender but full ROM, no redness.  NV intact.  Encounter Diagnoses  Name Primary?   Pain, joint, multiple sites Yes   Right wrist pain    Chronic pain of left knee    Chronic right shoulder pain    Get serum uric acid.  I refilled Mobic.  Return in three weeks.  I have given splint for wrist.  Call if any problem.  Precautions discussed.  Electronically Signed Sanjuana Kava, MD 3/5/20242:33 PM

## 2022-12-10 NOTE — Patient Instructions (Addendum)
Aspercreme, Biofreeze, Blue Emu or Voltaren Gel over the counter 2-3 times daily. Rub into area well each use for best results, especially in the evenings when it is more painful.   Pay attention to any activities that cause pain and when the pain starts.   Your refill will be sent in for Meloxicam. It will take a few hours for them to get it ready for you.    Follow up: 3 weeks  Have your lab drawn at [facility]

## 2022-12-11 ENCOUNTER — Telehealth: Payer: Self-pay

## 2022-12-11 LAB — URIC ACID: Uric Acid, Serum: 4.4 mg/dL (ref 2.5–7.0)

## 2022-12-11 NOTE — Telephone Encounter (Signed)
Spoke with patient. Advised her that her uric acid level is normal. She verbalized understanding

## 2022-12-19 ENCOUNTER — Other Ambulatory Visit: Payer: 59 | Admitting: Urology

## 2022-12-31 ENCOUNTER — Ambulatory Visit: Payer: Medicaid Other | Admitting: Orthopaedic Surgery

## 2022-12-31 ENCOUNTER — Encounter: Payer: Self-pay | Admitting: Orthopaedic Surgery

## 2022-12-31 VITALS — BP 125/69 | HR 73 | Ht 62.0 in | Wt 225.0 lb

## 2022-12-31 DIAGNOSIS — M25562 Pain in left knee: Secondary | ICD-10-CM

## 2022-12-31 DIAGNOSIS — M2550 Pain in unspecified joint: Secondary | ICD-10-CM | POA: Diagnosis not present

## 2022-12-31 DIAGNOSIS — M25531 Pain in right wrist: Secondary | ICD-10-CM | POA: Diagnosis not present

## 2022-12-31 DIAGNOSIS — G8929 Other chronic pain: Secondary | ICD-10-CM

## 2022-12-31 NOTE — Progress Notes (Signed)
My knee is hurting more today.  She has had more pain in the left knee over the last week.  She has no trauma, redness. She has no giving way. She is taking Mobic.  Her right wrist is improved.  Left knee ROM 0 to 110, stable, slight effusion, crepitus, no limp, NV intact.  Right wrist with slight ulnar dorsal swelling, good ROM, NV intact.  Encounter Diagnoses  Name Primary?   Pain, joint, multiple sites Yes   Right wrist pain    Chronic pain of left knee    Continue as she is doing.  Return in one month.  Call if any problem.  Precautions discussed.  Electronically Signed Sanjuana Kava, MD 3/26/20242:37 PM

## 2023-01-07 ENCOUNTER — Ambulatory Visit: Payer: 59 | Admitting: Adult Health

## 2023-01-28 ENCOUNTER — Ambulatory Visit: Payer: 59 | Admitting: Family Medicine

## 2023-02-04 ENCOUNTER — Ambulatory Visit: Payer: 59 | Admitting: Family Medicine

## 2023-02-06 ENCOUNTER — Other Ambulatory Visit: Payer: Medicaid Other | Admitting: Urology

## 2023-02-11 ENCOUNTER — Ambulatory Visit: Payer: Medicaid Other | Admitting: Orthopaedic Surgery

## 2023-02-11 ENCOUNTER — Encounter: Payer: Self-pay | Admitting: Orthopaedic Surgery

## 2023-02-11 VITALS — BP 134/74 | HR 76 | Ht 62.0 in | Wt 225.0 lb

## 2023-02-11 DIAGNOSIS — M25562 Pain in left knee: Secondary | ICD-10-CM | POA: Diagnosis not present

## 2023-02-11 DIAGNOSIS — G8929 Other chronic pain: Secondary | ICD-10-CM | POA: Diagnosis not present

## 2023-02-11 MED ORDER — MELOXICAM 7.5 MG PO TABS: ORAL_TABLET | ORAL | 5 refills | Status: AC

## 2023-02-11 NOTE — Progress Notes (Signed)
My knee is better.  The Mobic really helps her left knee pain.  She has less pain and more motion.  NV intact.  She has no effusion today.  Encounter Diagnosis  Name Primary?   Chronic pain of left knee Yes   I will refill her Mobic.  I will see prn.  Call if any problem.  Precautions discussed.  Electronically Signed Darreld Mclean, MD 5/7/20249:56 AM

## 2023-02-13 ENCOUNTER — Ambulatory Visit: Payer: 59 | Admitting: Urology

## 2023-02-18 ENCOUNTER — Ambulatory Visit: Payer: 59 | Admitting: Adult Health

## 2023-02-25 ENCOUNTER — Ambulatory Visit: Payer: 59 | Admitting: Family Medicine

## 2023-03-20 ENCOUNTER — Ambulatory Visit: Payer: Medicaid Other | Admitting: Urology

## 2023-03-20 ENCOUNTER — Encounter: Payer: Self-pay | Admitting: Urology

## 2023-03-20 VITALS — BP 132/85 | HR 99 | Ht 62.0 in | Wt 225.0 lb

## 2023-03-20 DIAGNOSIS — N200 Calculus of kidney: Secondary | ICD-10-CM | POA: Diagnosis not present

## 2023-03-20 DIAGNOSIS — Z87440 Personal history of urinary (tract) infections: Secondary | ICD-10-CM | POA: Diagnosis not present

## 2023-03-20 DIAGNOSIS — R8281 Pyuria: Secondary | ICD-10-CM

## 2023-03-20 MED ORDER — CEPHALEXIN 250 MG PO CAPS: 500.0000 mg | ORAL_CAPSULE | Freq: Once | ORAL | Status: DC

## 2023-03-20 NOTE — Progress Notes (Signed)
Subjective:  1. History of UTI   2. Nephrolithiasis   3. Pyuria      03/20/23: Sarah Davila returns today in f/u for her history of stones and UTI's.  She has had no hematuria or dysuria.  She has had no flank pain.  She still has some foamy urine at times.     09/19/22: Sarah Davila returns today in f/u from recent stone passage and UTI.   She had an e.coli on culture on 10/3/123 and again on 08/23/22.   She was given bactrim by her PCP on 09/11/22 for a week.  She was previously given keflex, ampicillin and nitrofurantoin.   She has no dysuria but has bubbles in her urine.  She has no pneumoturia or fecaluria.  She has no gross hematuria but has microhematuria.  She has no further flank pain.  She has had no fever.    08/14/22: Sarah Davila is a 62 y.o. female who is seen for evaluation of right ureteral calculus. She presented to the emergency room on 08/06/2022 with right flank pain.  No fevers, chills, nausea, or vomiting.  CT imaging showed a 3 x 3 mm right proximal ureteral calculus with moderate right-sided hydronephrosis, and bilateral renal calculi.  Her urinalysis was suspicious for UTI.  Urine culture grew 50 K E. coli.  She was treated with Rocephin and cephalexin.  She passed the stone on 08/12/2022.  She has had no further flank pain.  No dysuria or gross hematuria.  She completed her antibiotics last night.  She has a prior history of nephrolithiasis and has undergone ureteroscopy with laser lithotripsy in 2020.  Stone analysis showed mixed calcium oxalate.  She was found to have an elevated uric acid of 7.9.  24-hour urine showed a elevated urine calcium and high pH.       ROS:  ROS:  A complete review of systems was performed.  All systems are negative except for pertinent findings as noted.   Review of Systems  Musculoskeletal:  Positive for joint pain.  All other systems reviewed and are negative.   Allergies  Allergen Reactions   Ciprofloxacin Other (See Comments)     Sever hot flashes    Outpatient Encounter Medications as of 03/20/2023  Medication Sig   aspirin EC 81 MG tablet Take 81 mg by mouth daily.   capsaicin (ZOSTRIX) 0.025 % cream Apply topically 2 (two) times daily.   fexofenadine (ALLEGRA) 180 MG tablet Take 180 mg by mouth daily.   lisinopril (ZESTRIL) 20 MG tablet Take 1 tablet (20 mg total) by mouth daily.   meloxicam (MOBIC) 7.5 MG tablet One tablet daily after eating.   Vitamin D, Ergocalciferol, (DRISDOL) 1.25 MG (50000 UNIT) CAPS capsule Take 1 capsule (50,000 Units total) by mouth every 7 (seven) days.   [DISCONTINUED] Black Cohosh 200 MG CAPS Take 2 capsules by mouth daily. (Patient not taking: Reported on 03/20/2023)   [DISCONTINUED] sulfamethoxazole-trimethoprim (BACTRIM DS) 800-160 MG tablet Take 1/2 tab po qhs (Patient not taking: Reported on 03/20/2023)   [DISCONTINUED] trimethoprim (TRIMPEX) 100 MG tablet Take 1 tablet (100 mg total) by mouth at bedtime. (Patient not taking: Reported on 03/20/2023)   [DISCONTINUED] cephALEXin (KEFLEX) capsule 500 mg    No facility-administered encounter medications on file as of 03/20/2023.    Past Medical History:  Diagnosis Date   Coronary artery disease    Hypertension    Kidney stone    Renal disorder    Sepsis (HCC) 2015   urinary  Past Surgical History:  Procedure Laterality Date   CYSTOSCOPY W/ URETERAL STENT PLACEMENT Bilateral 02/04/2019   Procedure: CYSTOSCOPY WITH RETROGRADE PYELOGRAM/URETERAL STENT PLACEMENT;  Surgeon: Bjorn Pippin, MD;  Location: [facility];  Service: Urology;  Laterality: Bilateral;   CYSTOSCOPY/URETEROSCOPY/HOLMIUM LASER/STENT PLACEMENT Bilateral 02/16/2019   Procedure: CYSTOSCOPY BILATERAL URETEROSCOPY WITH HOLMIUM LASER AND STENTS PLACEMENT;  Surgeon: Bjorn Pippin, MD;  Location: [facility];  Service: Urology;  Laterality: Bilateral;    Social History   Socioeconomic History   Marital status: Divorced    Spouse name: Not on file   Number of children: Not on  file   Years of education: Not on file   Highest education level: Not on file  Occupational History   Not on file  Tobacco Use   Smoking status: Never   Smokeless tobacco: Never  Vaping Use   Vaping Use: Never used  Substance and Sexual Activity   Alcohol use: Never   Drug use: Never   Sexual activity: Yes    Birth control/protection: I.U.D.  Other Topics Concern   Not on file  Social History Narrative   Not on file   Social Determinants of Health   Financial Resource Strain: Low Risk  (08/30/2020)   Overall Financial Resource Strain (CARDIA)    Difficulty of Paying Living Expenses: Not hard at all  Food Insecurity: No Food Insecurity (08/30/2020)   Hunger Vital Sign    Worried About Running Out of Food in the Last Year: Never true    Ran Out of Food in the Last Year: Never true  Transportation Needs: No Transportation Needs (08/30/2020)   PRAPARE - Administrator, Civil Service (Medical): No    Lack of Transportation (Non-Medical): No  Physical Activity: Sufficiently Active (08/30/2020)   Exercise Vital Sign    Days of Exercise per Week: 7 days    Minutes of Exercise per Session: 60 min  Stress: No Stress Concern Present (08/30/2020)   [employer] of Occupational Health - Occupational Stress Questionnaire    Feeling of Stress : Not at all  Social Connections: Socially Isolated (08/30/2020)   Social Connection and Isolation Panel [NHANES]    Frequency of Communication with Friends and Family: More than three times a week    Frequency of Social Gatherings with Friends and Family: Never    Attends Religious Services: Never    Database administrator or Organizations: No    Attends Banker Meetings: Never    Marital Status: Divorced  Catering manager Violence: Not At Risk (08/30/2020)   Humiliation, Afraid, Rape, and Kick questionnaire    Fear of Current or Ex-Partner: No    Emotionally Abused: No    Physically Abused: No    Sexually  Abused: No    Family History  Problem Relation Age of Onset   Hepatitis Mother    Cirrhosis Mother    Diabetes Father    Cancer Other        Objective: Vitals:   03/20/23 1442  BP: 132/85  Pulse: 99     Physical Exam Vitals reviewed.  Constitutional:      Appearance: Normal appearance.  Neurological:     Mental Status: She is alert.     Lab Results:  UA has 11-30 WBC with moderate bacteria..   No results found for this or any previous visit (from the past 24 hour(s)).   Recent cultures reviewed.  Studies/Results:   Results for orders placed during the hospital encounter of  09/12/20  CT Renal Stone Study  Narrative CLINICAL DATA:  Right flank pain.  EXAM: CT ABDOMEN AND PELVIS WITHOUT CONTRAST  TECHNIQUE: Multidetector CT imaging of the abdomen and pelvis was performed following the standard protocol without IV contrast.  COMPARISON:  CT February 02, 2019.  FINDINGS: Lower chest: No acute abnormality.  Hepatobiliary: No focal liver abnormality is seen. Similar appearance of multiple gallstones. No gallbladder wall thickening or inflammatory change. Mild hepatomegaly.  Pancreas: Unremarkable. No pancreatic ductal dilatation or surrounding inflammatory changes.  Spleen: Normal in size without focal abnormality.  Adrenals/Urinary Tract: Normal adrenal glands. There are multiple bilateral renal calculi. The largest is on the right and measures approximately 5 mm and is interpolar location. No hydronephrosis. No ureteral calculi identified. No ureteral nephrosis.  Stomach/Bowel: Stomach is within normal limits. Appendix appears normal. No evidence of bowel wall thickening, distention, or inflammatory changes. Stool throughout the colon.  Vascular/Lymphatic: Aortic atherosclerosis. No enlarged abdominal or pelvic lymph nodes.  Reproductive: Uterus and bilateral adnexa are unremarkable. IUD in place.  Other: No significant free  fluid.  Musculoskeletal: Multilevel degenerative disc disease.  IMPRESSION: 1. Bilateral nephrolithiasis without evidence of hydronephrosis or ureteral calculus. 2. Cholelithiasis. 3. Moderate colonic stool burden.   Electronically Signed By: Feliberto Harts MD On: 09/12/2020 14:41    Assessment & Plan: Recurrent UTI.  Her UA is suggestive of a UTI today.  I will get a culture and hold off on cystoscopy for now.  Bilateral renal calculi.   She has small bilateral stones on her recent CT.   This is not the common picture I see with stones harboring infection, but if she continues to have issues with infection, we could consider bilateral ureteroscopy.      Meds ordered this encounter  Medications   DISCONTD: cephALEXin (KEFLEX) capsule 500 mg     Orders Placed This Encounter  Procedures   Urine Culture   Microscopic Examination   Urinalysis, Routine w reflex microscopic      Return for Next available for cystoscopy.   CC: Gilmore Laroche, FNP      Bjorn Pippin 03/22/2023

## 2023-03-21 LAB — URINALYSIS, ROUTINE W REFLEX MICROSCOPIC
Bilirubin, UA: NEGATIVE
Glucose, UA: NEGATIVE
Ketones, UA: NEGATIVE
Nitrite, UA: NEGATIVE
Protein,UA: NEGATIVE
RBC, UA: NEGATIVE
Specific Gravity, UA: 1.025 (ref 1.005–1.030)
Urobilinogen, Ur: 0.2 mg/dL (ref 0.2–1.0)
pH, UA: 6 (ref 5.0–7.5)

## 2023-03-21 LAB — MICROSCOPIC EXAMINATION

## 2023-03-22 LAB — URINE CULTURE

## 2023-03-24 ENCOUNTER — Telehealth: Payer: Self-pay

## 2023-03-24 NOTE — Telephone Encounter (Signed)
Patient aware of MD response to culture.  Cysto scheduled for 06/27 at 0930

## 2023-03-24 NOTE — Telephone Encounter (Signed)
-----   Message from Bjorn Pippin, MD sent at 03/24/2023  9:08 AM EDT ----- Her culture is negative so we can get her back for cysto at any time.  ----- Message ----- From: Grier Rocher, CMA Sent: 03/24/2023   8:44 AM EDT To: Bjorn Pippin, MD  Please review.

## 2023-04-03 ENCOUNTER — Other Ambulatory Visit: Payer: Medicaid Other | Admitting: Urology

## 2023-04-08 ENCOUNTER — Encounter: Payer: Self-pay | Admitting: Orthopaedic Surgery

## 2023-04-08 ENCOUNTER — Ambulatory Visit: Payer: 59 | Admitting: Adult Health

## 2023-04-08 ENCOUNTER — Ambulatory Visit: Payer: Medicaid Other | Admitting: Orthopaedic Surgery

## 2023-04-08 VITALS — BP 150/79 | HR 91 | Ht 62.0 in | Wt 237.0 lb

## 2023-04-08 DIAGNOSIS — M65341 Trigger finger, right ring finger: Secondary | ICD-10-CM

## 2023-04-08 NOTE — Progress Notes (Signed)
My finger hurts.  She has had some pain of the right ring finger in the palm.  The other day she was trying to pull something hard with her right hand and felt a pain in the right finger finger base and palm.  She has had some occasional triggering of the finger.  She has no swelling, no redness, no ecchymosis.  The right ring finger has tenderness at the A1 pulley but no triggering today.  I do feel a little crepitus.  NV intact.  Encounter Diagnosis  Name Primary?   Trigger finger, right ring finger Yes   I have recommended Aspercreme, Biofreeze or Voltaren Gel.  She may need injection.  Return in two weeks.  Call if any problem.  Precautions discussed.  Electronically Signed Darreld Mclean, MD 7/2/20242:52 PM

## 2023-04-12 ENCOUNTER — Emergency Department (HOSPITAL_COMMUNITY)
Admission: EM | Admit: 2023-04-12 | Discharge: 2023-04-12 | Disposition: A | Discharge status: Home / Self Care | Payer: Medicaid Other | Attending: Emergency Medicine | Admitting: Emergency Medicine

## 2023-04-12 ENCOUNTER — Encounter (HOSPITAL_COMMUNITY): Payer: Self-pay | Admitting: *Deleted

## 2023-04-12 ENCOUNTER — Other Ambulatory Visit: Payer: Self-pay

## 2023-04-12 ENCOUNTER — Emergency Department (HOSPITAL_COMMUNITY): Payer: Medicaid Other

## 2023-04-12 DIAGNOSIS — K8020 Calculus of gallbladder without cholecystitis without obstruction: Secondary | ICD-10-CM | POA: Diagnosis not present

## 2023-04-12 DIAGNOSIS — Z7982 Long term (current) use of aspirin: Secondary | ICD-10-CM | POA: Insufficient documentation

## 2023-04-12 DIAGNOSIS — R102 Pelvic and perineal pain: Secondary | ICD-10-CM | POA: Diagnosis present

## 2023-04-12 DIAGNOSIS — R109 Unspecified abdominal pain: Secondary | ICD-10-CM | POA: Diagnosis not present

## 2023-04-12 DIAGNOSIS — I10 Essential (primary) hypertension: Secondary | ICD-10-CM | POA: Insufficient documentation

## 2023-04-12 DIAGNOSIS — Z79899 Other long term (current) drug therapy: Secondary | ICD-10-CM | POA: Insufficient documentation

## 2023-04-12 DIAGNOSIS — N201 Calculus of ureter: Secondary | ICD-10-CM | POA: Diagnosis not present

## 2023-04-12 DIAGNOSIS — N132 Hydronephrosis with renal and ureteral calculous obstruction: Secondary | ICD-10-CM | POA: Diagnosis not present

## 2023-04-12 LAB — COMPREHENSIVE METABOLIC PANEL
ALT: 30 U/L (ref 0–44)
AST: 26 U/L (ref 15–41)
Albumin: 4 g/dL (ref 3.5–5.0)
Alkaline Phosphatase: 111 U/L (ref 38–126)
Anion gap: 11 (ref 5–15)
BUN: 17 mg/dL (ref 6–20)
CO2: 23 mmol/L (ref 22–32)
Calcium: 9.3 mg/dL (ref 8.9–10.3)
Chloride: 101 mmol/L (ref 98–111)
Creatinine, Ser: 0.95 mg/dL (ref 0.44–1.00)
GFR, Estimated: >60 mL/min (ref >60)
Glucose, Bld: 102 mg/dL — ABNORMAL HIGH (ref 70–99)
Potassium: 4.2 mmol/L (ref 3.5–5.1)
Sodium: 135 mmol/L (ref 135–145)
Total Bilirubin: 0.6 mg/dL (ref 0.3–1.2)
Total Protein: 7.6 g/dL (ref 6.5–8.1)

## 2023-04-12 LAB — CBC WITH DIFFERENTIAL/PLATELET
Abs Immature Granulocytes: 0.02 10*3/uL (ref 0.00–0.07)
Basophils Absolute: 0.1 10*3/uL (ref 0.0–0.1)
Basophils Relative: 1 %
Eosinophils Absolute: 0.2 10*3/uL (ref 0.0–0.5)
Eosinophils Relative: 3 %
HCT: 42.5 % (ref 36.0–46.0)
Hemoglobin: 13.8 g/dL (ref 12.0–15.0)
Immature Granulocytes: 0 %
Lymphocytes Relative: 31 %
Lymphs Abs: 1.9 10*3/uL (ref 0.7–4.0)
MCH: 28.2 pg (ref 26.0–34.0)
MCHC: 32.5 g/dL (ref 30.0–36.0)
MCV: 86.7 fL (ref 80.0–100.0)
Monocytes Absolute: 0.5 10*3/uL (ref 0.1–1.0)
Monocytes Relative: 8 %
Neutro Abs: 3.5 10*3/uL (ref 1.7–7.7)
Neutrophils Relative %: 57 %
Platelets: 310 10*3/uL (ref 150–400)
RBC: 4.9 MIL/uL (ref 3.87–5.11)
RDW: 13.1 % (ref 11.5–15.5)
WBC: 6.1 10*3/uL (ref 4.0–10.5)
nRBC: 0 % (ref 0.0–0.2)

## 2023-04-12 LAB — URINALYSIS, ROUTINE W REFLEX MICROSCOPIC
Bacteria, UA: NONE SEEN
Bilirubin Urine: NEGATIVE
Glucose, UA: NEGATIVE mg/dL
Ketones, ur: NEGATIVE mg/dL
Nitrite: NEGATIVE
Protein, ur: NEGATIVE mg/dL
Specific Gravity, Urine: 1.015 (ref 1.005–1.030)
pH: 6 (ref 5.0–8.0)

## 2023-04-12 LAB — LIPASE, BLOOD: Lipase: 52 U/L — ABNORMAL HIGH (ref 11–51)

## 2023-04-12 MED ORDER — NAPROXEN 500 MG PO TABS: 500.0000 mg | ORAL_TABLET | Freq: Two times a day (BID) | ORAL | 0 refills | Status: DC

## 2023-04-12 MED ORDER — MORPHINE SULFATE (PF) 4 MG/ML IV SOLN
4.0000 mg | Freq: Once | INTRAVENOUS | Status: AC
  Administered 2023-04-12: 4 mg via INTRAVENOUS
  Filled 2023-04-12: qty 1

## 2023-04-12 MED ORDER — ONDANSETRON HCL 4 MG/2ML IJ SOLN
4.0000 mg | Freq: Once | INTRAMUSCULAR | Status: AC
  Administered 2023-04-12: 4 mg via INTRAVENOUS
  Filled 2023-04-12: qty 2

## 2023-04-12 MED ORDER — HYDROCODONE-ACETAMINOPHEN 5-325 MG PO TABS: 1.0000 | ORAL_TABLET | Freq: Four times a day (QID) | ORAL | 0 refills | Status: DC | PRN

## 2023-04-12 MED ORDER — KETOROLAC TROMETHAMINE 15 MG/ML IJ SOLN
15.0000 mg | Freq: Once | INTRAMUSCULAR | Status: AC
  Administered 2023-04-12: 15 mg via INTRAVENOUS
  Filled 2023-04-12: qty 1

## 2023-04-12 MED ORDER — ONDANSETRON 4 MG PO TBDP: 4.0000 mg | ORAL_TABLET | Freq: Four times a day (QID) | ORAL | 0 refills | Status: DC | PRN

## 2023-04-12 NOTE — Discharge Instructions (Addendum)
It was a pleasure taking care of you today.  You were seen for left sided pelvic pain and spasms.  Your CT does show a 6 x 6 x 8 mm stone in your ureter causing hydronephrosis.  You are given medication to help with your pain.  You can also take the Meloxicam you have at home for pain. You can continue taking the Flomax you have at home.  Call your urologist on Monday for close follow-up.  Come back if you have worsening pain that is not controlled with medications at home, vomiting, fevers or any other new or worsening symptoms.

## 2023-04-12 NOTE — ED Notes (Signed)
Patient transported to CT 

## 2023-04-12 NOTE — ED Triage Notes (Signed)
Pt states she has vaginal spasms x 3 weeks off and on.  Denies any odor or burning with urination. Pt with hx of kidney stones.

## 2023-04-12 NOTE — ED Provider Notes (Signed)
[place] EMERGENCY DEPARTMENT AT [facility] Provider Note   CSN: [id number] Arrival date & time: 04/12/23  1222     History  No chief complaint on file.   Sarah Davila is a 62 y.o. female.  She has PMH of UTIs and kidney stones in the past, history of high blood pressure and high cholesterol as well.  Presents the ER complaining of left vaginal pain described as burning, states it feels like spasms.  This been going on intermittently for the past month that has been relatively mild until yesterday but has been more constant and more severe.  She had some nausea this morning but no vomiting, no fevers, no flank pain.  Denies any vaginal discharge, no dysuria or frequency.  Denies any genital rash, she is never had pain like this in the past.  She tried taking Flomax she had leftover without relief.  HPI     Home Medications Prior to Admission medications   Medication Sig Start Date End Date Taking? Authorizing Provider  HYDROcodone-acetaminophen (NORCO) 5-325 MG tablet Take 1 tablet by mouth every 6 (six) hours as needed for moderate pain. 04/12/23  Yes Ammar Moffatt A, PA-C  ondansetron (ZOFRAN-ODT) 4 MG disintegrating tablet Take 1 tablet (4 mg total) by mouth every 6 (six) hours as needed for nausea or vomiting. 04/12/23  Yes Maisa Bedingfield A, PA-C  aspirin EC 81 MG tablet Take 81 mg by mouth daily.    [provider]  capsaicin (ZOSTRIX) 0.025 % cream Apply topically 2 (two) times daily. 10/22/22   Gilmore Laroche, FNP  fexofenadine (ALLEGRA) 180 MG tablet Take 180 mg by mouth daily.    [provider]  lisinopril (ZESTRIL) 20 MG tablet Take 1 tablet (20 mg total) by mouth daily. 10/22/22   Gilmore Laroche, FNP  meloxicam (MOBIC) 7.5 MG tablet One tablet daily after eating. 02/11/23   Darreld Mclean, MD  Vitamin D, Ergocalciferol, (DRISDOL) 1.25 MG (50000 UNIT) CAPS capsule Take 1 capsule (50,000 Units total) by mouth every 7 (seven) days. 10/25/22    Gilmore Laroche, FNP      Allergies    Ciprofloxacin    Review of Systems   Review of Systems  Physical Exam Updated Vital Signs BP 106/63 (BP Location: Right Arm)   Pulse 92   Temp 98.1 F (36.7 C) (Oral)   Resp 20   LMP 03/15/2013 (Approximate) Comment: LMP @ age 18. Pt has Mirena IUD  SpO2 97%  Physical Exam Vitals and nursing note reviewed.  Constitutional:      General: She is not in acute distress.    Appearance: She is well-developed.  HENT:     Head: Normocephalic and atraumatic.  Eyes:     Conjunctiva/sclera: Conjunctivae normal.  Cardiovascular:     Rate and Rhythm: Normal rate and regular rhythm.     Heart sounds: No murmur heard. Pulmonary:     Effort: Pulmonary effort is normal. No respiratory distress.     Breath sounds: Normal breath sounds.  Abdominal:     Palpations: Abdomen is soft.     Tenderness: There is no abdominal tenderness. There is no right CVA tenderness or left CVA tenderness.  Musculoskeletal:        General: No swelling.     Cervical back: Neck supple.  Skin:    General: Skin is warm and dry.     Capillary Refill: Capillary refill takes less than 2 seconds.  Neurological:     General: No  focal deficit present.     Mental Status: She is alert and oriented to person, place, and time.  Psychiatric:        Mood and Affect: Mood normal.     ED Results / Procedures / Treatments   Labs (all labs ordered are listed, but only abnormal results are displayed) Labs Reviewed  URINALYSIS, ROUTINE W REFLEX MICROSCOPIC - Abnormal; Notable for the following components:      Result Value   Hgb urine dipstick SMALL (*)    Leukocytes,Ua MODERATE (*)    All other components within normal limits  COMPREHENSIVE METABOLIC PANEL - Abnormal; Notable for the following components:   Glucose, Bld 102 (*)    All other components within normal limits  LIPASE, BLOOD - Abnormal; Notable for the following components:   Lipase 52 (*)    All other components  within normal limits  URINE CULTURE  CBC WITH DIFFERENTIAL/PLATELET    EKG None  Radiology CT ABDOMEN PELVIS WO CONTRAST  Result Date: 04/12/2023 CLINICAL DATA:  Abdominal/flank pain.  Kidney stones suspected. EXAM: CT ABDOMEN AND PELVIS WITHOUT CONTRAST TECHNIQUE: Multidetector CT imaging of the abdomen and pelvis was performed following the standard protocol without IV contrast. RADIATION DOSE REDUCTION: This exam was performed according to the departmental dose-optimization program which includes automated exposure control, adjustment of the mA and/or kV according to patient size and/or use of iterative reconstruction technique. COMPARISON:  08/06/2022 FINDINGS: Lower chest: Unremarkable Hepatobiliary: No suspicious focal abnormality in the liver on this study without intravenous contrast. Calcified gallstones evident. No intrahepatic or extrahepatic biliary dilation. Pancreas: No focal mass lesion. No dilatation of the main duct. No intraparenchymal cyst. No peripancreatic edema. Spleen: No splenomegaly. No suspicious focal mass lesion. Adrenals/Urinary Tract: No adrenal nodule or mass. 2 mm nonobstructing interpolar right renal stone. Right ureter unremarkable.Cluster of tiny 1 mm stones identified lower pole left kidney. Mild left hydroureteronephrosis evident with 6 x 6 x 8 mm stone in the distal left ureter (71/2 and coronal 80/5). The urinary bladder appears normal for the degree of distention. Stomach/Bowel: Stomach is unremarkable. No gastric wall thickening. No evidence of outlet obstruction. Duodenum is normally positioned as is the ligament of Treitz. No small bowel wall thickening. No small bowel dilatation. The terminal ileum is normal. The appendix is normal. No gross colonic mass. No colonic wall thickening. Vascular/Lymphatic: There is mild atherosclerotic calcification of the abdominal aorta without aneurysm. There is no gastrohepatic or hepatoduodenal ligament lymphadenopathy. No  retroperitoneal or mesenteric lymphadenopathy. No pelvic sidewall lymphadenopathy. Reproductive: IUD is visualized in the uterus. There is no adnexal mass. Other: No intraperitoneal free fluid. Musculoskeletal: No worrisome lytic or sclerotic osseous abnormality. IMPRESSION: 1. 6 x 6 x 8 mm distal left ureteral stone with mild left hydroureteronephrosis. 2. Bilateral nephrolithiasis. 3. Cholelithiasis. 4.  Aortic Atherosclerosis (ICD10-I70.0). Electronically Signed   By: Kennith Center M.D.   On: 04/12/2023 15:12    Procedures Procedures    Medications Ordered in ED Medications  ketorolac (TORADOL) 15 MG/ML injection 15 mg (15 mg Intravenous Given 04/12/23 1355)  ondansetron (ZOFRAN) injection 4 mg (4 mg Intravenous Given 04/12/23 1355)  morphine (PF) 4 MG/ML injection 4 mg (4 mg Intravenous Given 04/12/23 1454)    ED Course/ Medical Decision Making/ A&P                             Medical Decision Making Ddx: Renal colic, vaginitis, UTI, other ED  course: Patient presents for cramping and spasm feeling to the left vaginal area, history of kidney stones but usually has pain in her back with this but pain in quality is similar.  No dysuria or flank pain or fevers, no vomiting.  Labs are reassuring, CT shows left hydronephrosis with a 6 x 6 x 8 mm stone in the distal ureter which is likely the cause of patient's symptoms.    She is feeling better on my reevaluation after morphine and Toradol.  Patient is already established with urology is to call Monday for close follow-up.  She has had to have ureteroscopy with lithotripsy in the past.  Advised patient she may need for surgery if this does not spontaneously pass, she has Flomax at home that she is going to take.  Given prescription for Norco.  She already has meloxicam at home was going to take that rather than prescription of naproxen which was going to be provided.  She was given strict return precautions if she develops any infectious symptoms,  worsening pain or vomiting that does not resolve with Zofran at home she is to come back.  She is agreeable plan of care and discharge.  Amount and/or Complexity of Data Reviewed Labs: ordered.    Details: CBC normal, CMP normal, lipase slightly above upper limit of normal not felt to be clinically relevant, pyuria and microscopic hematuria, no bacteria Radiology: ordered and independent interpretation performed.    Details: Sided hydronephrosis with distal left ureteral stone  Risk Prescription drug management.           Final Clinical Impression(s) / ED Diagnoses Final diagnoses:  Ureterolithiasis    Rx / DC Orders ED Discharge Orders          Ordered    HYDROcodone-acetaminophen (NORCO) 5-325 MG tablet  Every 6 hours PRN        04/12/23 1519    naproxen (NAPROSYN) 500 MG tablet  2 times daily,   Status:  Discontinued        04/12/23 1519    ondansetron (ZOFRAN-ODT) 4 MG disintegrating tablet  Every 6 hours PRN        04/12/23 1519              Ma Rings, PA-C 04/12/23 1640    Benjiman Core, MD 04/13/23 [id number]

## 2023-04-12 NOTE — ED Notes (Signed)
ED Provider at bedside. 

## 2023-04-13 LAB — URINE CULTURE: Culture: NO GROWTH

## 2023-04-14 ENCOUNTER — Telehealth: Payer: Self-pay

## 2023-04-14 NOTE — Telephone Encounter (Signed)
Patient called advising that she was seen last week at the ED for kidney stones. She advised that they did not prescribe an antibiotic and she wanted to ensure she did not need once. She advised that did prescribe her Hydrocodacetam 5-325 mg but it is not helping with the pain. You are booked this week and next week. I wanted to see your recommendation for patient.    Thank you

## 2023-04-15 ENCOUNTER — Ambulatory Visit: Payer: Medicaid Other | Admitting: Family Medicine

## 2023-04-15 ENCOUNTER — Encounter: Payer: Self-pay | Admitting: Family Medicine

## 2023-04-15 VITALS — BP 130/72 | HR 94 | Ht 62.0 in | Wt 237.0 lb

## 2023-04-15 DIAGNOSIS — Z1211 Encounter for screening for malignant neoplasm of colon: Secondary | ICD-10-CM | POA: Diagnosis not present

## 2023-04-15 DIAGNOSIS — N200 Calculus of kidney: Secondary | ICD-10-CM

## 2023-04-15 DIAGNOSIS — R0600 Dyspnea, unspecified: Secondary | ICD-10-CM | POA: Diagnosis not present

## 2023-04-15 MED ORDER — ALBUTEROL SULFATE HFA 108 (90 BASE) MCG/ACT IN AERS: 2.0000 | INHALATION_SPRAY | Freq: Four times a day (QID) | RESPIRATORY_TRACT | 2 refills | Status: AC | PRN

## 2023-04-15 NOTE — Patient Instructions (Addendum)
I appreciate the opportunity to provide care to you today!   Physical activity helps: Lower your blood glucose, improve your heart health, lower your blood pressure and cholesterol, burn calories to help manage her weight, gave you energy, lower stress, and improve his sleep.  The American diabetes Association (ADA) recommends being active for 2-1/2 hours (150 minutes) or more week.  Exercise for 30 minutes, 5 days a week (150 minutes total)    Referrals today- Pulmonary  Attached with your AVS, you will find valuable resources for self-education. I highly recommend dedicating some time to thoroughly examine them.   Please continue to a heart-healthy diet and increase your physical activities. Try to exercise for at least five days a week.    It was a pleasure to see you and I look forward to continuing to work together on your health and well-being. Please do not hesitate to call the office if you need care or have questions about your care.  In case of emergency, please visit the Emergency Department for urgent care, or contact our clinic at [phone] to schedule an appointment. We're here to help you!   Have a wonderful day and week. With Gratitude, Gilmore Laroche MSN, FNP-BC

## 2023-04-15 NOTE — Progress Notes (Signed)
Established Patient Office Visit  Subjective:  Patient ID: Sarah Davila, female    DOB: [birth year]  Age: 62 y.o. MRN: [id number]  CC:  Chief Complaint  Patient presents with   Chronic Care Management    Following up. Was told she has kidney stones.     HPI Sarah Davila is a 62 y.o. female with past medical history of  HTN, HLP previous pain meds heart rate 21 AutoPap biweekly appointments.  The previous option for a presents for  ED f/u.  Ureterolithiasis: The patient presents to the ED on 04/12/2023 with reports of cramping and spasm feeling of the left vaginal area.  Labs were reassuring, CT scan showed left hydronephrosis with a 6 x 6 x 8 mm stone in the distal ureter.  She has follow-up with urology and is currently on Flomax.  The patient was discharged home with naproxen 500 mg twice daily and Norco 5-325. No pain reported today. She will be following up with urology 04/17/23.   Dyspnea: c/o increased SOB when talking. Reports childhood hx of asthma that she outgrew. No reports of chest pain, cough, lower extremity edema, palpitations, orthopnea or syncope.   Past Medical History:  Diagnosis Date   Coronary artery disease    Hypertension    Kidney stone    Renal disorder    Sepsis (HCC) 2015   urinary    Past Surgical History:  Procedure Laterality Date   CYSTOSCOPY W/ URETERAL STENT PLACEMENT Bilateral 02/04/2019   Procedure: CYSTOSCOPY WITH RETROGRADE PYELOGRAM/URETERAL STENT PLACEMENT;  Surgeon: Bjorn Pippin, MD;  Location: [facility];  Service: Urology;  Laterality: Bilateral;   CYSTOSCOPY/URETEROSCOPY/HOLMIUM LASER/STENT PLACEMENT Bilateral 02/16/2019   Procedure: CYSTOSCOPY BILATERAL URETEROSCOPY WITH HOLMIUM LASER AND STENTS PLACEMENT;  Surgeon: Bjorn Pippin, MD;  Location: [facility];  Service: Urology;  Laterality: Bilateral;    Family History  Problem Relation Age of Onset   Hepatitis Mother    Cirrhosis Mother    Diabetes Father    Cancer Other      Social History   Socioeconomic History   Marital status: Divorced    Spouse name: Not on file   Number of children: Not on file   Years of education: Not on file   Highest education level: Not on file  Occupational History   Not on file  Tobacco Use   Smoking status: Never   Smokeless tobacco: Never  Vaping Use   Vaping Use: Never used  Substance and Sexual Activity   Alcohol use: Never   Drug use: Never   Sexual activity: Yes    Birth control/protection: I.U.D.  Other Topics Concern   Not on file  Social History Narrative   Not on file   Social Determinants of Health   Financial Resource Strain: Low Risk  (08/30/2020)   Overall Financial Resource Strain (CARDIA)    Difficulty of Paying Living Expenses: Not hard at all  Food Insecurity: No Food Insecurity (08/30/2020)   Hunger Vital Sign    Worried About Running Out of Food in the Last Year: Never true    Ran Out of Food in the Last Year: Never true  Transportation Needs: No Transportation Needs (08/30/2020)   PRAPARE - Administrator, Civil Service (Medical): No    Lack of Transportation (Non-Medical): No  Physical Activity: Sufficiently Active (08/30/2020)   Exercise Vital Sign    Days of Exercise per Week: 7 days    Minutes of Exercise per Session: 60 min  Stress: No Stress Concern Present (08/30/2020)   [employer] of Occupational Health - Occupational Stress Questionnaire    Feeling of Stress : Not at all  Social Connections: Socially Isolated (08/30/2020)   Social Connection and Isolation Panel [NHANES]    Frequency of Communication with Friends and Family: More than three times a week    Frequency of Social Gatherings with Friends and Family: Never    Attends Religious Services: Never    Database administrator or Organizations: No    Attends Banker Meetings: Never    Marital Status: Divorced  Catering manager Violence: Not At Risk (08/30/2020)   Humiliation, Afraid,  Rape, and Kick questionnaire    Fear of Current or Ex-Partner: No    Emotionally Abused: No    Physically Abused: No    Sexually Abused: No    Outpatient Medications Prior to Visit  Medication Sig Dispense Refill   aspirin EC 81 MG tablet Take 81 mg by mouth daily.     capsaicin (ZOSTRIX) 0.025 % cream Apply topically 2 (two) times daily. 60 g 0   fexofenadine (ALLEGRA) 180 MG tablet Take 180 mg by mouth daily.     HYDROcodone-acetaminophen (NORCO) 5-325 MG tablet Take 1 tablet by mouth every 6 (six) hours as needed for moderate pain. 10 tablet 0   lisinopril (ZESTRIL) 20 MG tablet Take 1 tablet (20 mg total) by mouth daily. 90 tablet 3   meloxicam (MOBIC) 7.5 MG tablet One tablet daily after eating. 30 tablet 5   ondansetron (ZOFRAN-ODT) 4 MG disintegrating tablet Take 1 tablet (4 mg total) by mouth every 6 (six) hours as needed for nausea or vomiting. 15 tablet 0   Vitamin D, Ergocalciferol, (DRISDOL) 1.25 MG (50000 UNIT) CAPS capsule Take 1 capsule (50,000 Units total) by mouth every 7 (seven) days. 10 capsule 1   No facility-administered medications prior to visit.    Allergies  Allergen Reactions   Ciprofloxacin Other (See Comments)    Sever hot flashes    ROS Review of Systems  Constitutional:  Negative for chills and fever.  Eyes:  Negative for visual disturbance.  Respiratory:  Positive for shortness of breath. Negative for chest tightness.   Neurological:  Negative for dizziness and headaches.      Objective:    Physical Exam HENT:     Head: Normocephalic.     Mouth/Throat:     Mouth: Mucous membranes are moist.  Cardiovascular:     Rate and Rhythm: Normal rate.     Heart sounds: Normal heart sounds.  Pulmonary:     Effort: Pulmonary effort is normal.     Breath sounds: Normal breath sounds.  Neurological:     Mental Status: She is alert.     BP 130/72   Pulse 94   Ht 5\' 2"  (1.575 m)   Wt 237 lb 0.6 oz (107.5 kg)   LMP 03/15/2013 (Approximate)  Comment: LMP @ age 54. Pt has Mirena IUD  SpO2 94%   BMI 43.36 kg/m  Wt Readings from Last 3 Encounters:  04/15/23 237 lb 0.6 oz (107.5 kg)  04/08/23 237 lb (107.5 kg)  03/20/23 225 lb (102.1 kg)    Lab Results  Component Value Date   TSH 1.350 10/23/2022   Lab Results  Component Value Date   WBC 6.1 04/12/2023   HGB 13.8 04/12/2023   HCT 42.5 04/12/2023   MCV 86.7 04/12/2023   PLT 310 04/12/2023   Lab Results  Component  Value Date   NA 135 04/12/2023   K 4.2 04/12/2023   CO2 23 04/12/2023   GLUCOSE 102 (H) 04/12/2023   BUN 17 04/12/2023   CREATININE 0.95 04/12/2023   BILITOT 0.6 04/12/2023   ALKPHOS 111 04/12/2023   AST 26 04/12/2023   ALT 30 04/12/2023   PROT 7.6 04/12/2023   ALBUMIN 4.0 04/12/2023   CALCIUM 9.3 04/12/2023   ANIONGAP 11 04/12/2023   EGFR 103 10/23/2022   Lab Results  Component Value Date   CHOL 216 (H) 10/23/2022   Lab Results  Component Value Date   HDL 62 10/23/2022   Lab Results  Component Value Date   LDLCALC 134 (H) 10/23/2022   Lab Results  Component Value Date   TRIG 112 10/23/2022   Lab Results  Component Value Date   CHOLHDL 3.5 10/23/2022   Lab Results  Component Value Date   HGBA1C 5.6 10/23/2022      Assessment & Plan:  Dyspnea, unspecified type Assessment & Plan: BMI of 43.36 Encouraged moderate intensity exercises 3-4 times weekly Referral placed to assess lung function    Orders: -     Ambulatory referral to Pulmonology -     Albuterol Sulfate HFA; Inhale 2 puffs into the lungs every 6 (six) hours as needed for wheezing or shortness of breath.  Dispense: 8 g; Refill: 2  Nephrolithiasis Assessment & Plan: Encouraged low protein diet with increased fluid intake, at least 64 ounces daily Encouraged to follow up with urology on 06/18/23     Follow-up: Return if symptoms worsen or fail to improve.   Gilmore Laroche, FNP

## 2023-04-15 NOTE — Assessment & Plan Note (Addendum)
Encouraged low protein diet with increased fluid intake, at least 64 ounces daily Encouraged to follow up with urology on 06/18/23

## 2023-04-15 NOTE — Assessment & Plan Note (Signed)
BMI of 43.36 Encouraged moderate intensity exercises 3-4 times weekly Referral placed to assess lung function

## 2023-04-16 ENCOUNTER — Encounter: Payer: Self-pay | Admitting: *Deleted

## 2023-04-16 NOTE — Addendum Note (Signed)
Addended byGilmore Laroche on: 04/16/2023 08:57 AM   Modules accepted: Orders

## 2023-04-17 ENCOUNTER — Telehealth: Payer: Self-pay

## 2023-04-17 ENCOUNTER — Other Ambulatory Visit: Payer: Self-pay | Admitting: Urology

## 2023-04-17 ENCOUNTER — Encounter: Payer: Self-pay | Admitting: Urology

## 2023-04-17 ENCOUNTER — Ambulatory Visit: Payer: Medicaid Other | Admitting: Urology

## 2023-04-17 ENCOUNTER — Ambulatory Visit (HOSPITAL_COMMUNITY)
Admission: RE | Admit: 2023-04-17 | Discharge: 2023-04-17 | Disposition: A | Discharge status: Home / Self Care | Payer: Medicaid Other | Source: Ambulatory Visit | Attending: Urology | Admitting: Urology

## 2023-04-17 VITALS — BP 110/71 | HR 83 | Ht 62.0 in | Wt 237.0 lb

## 2023-04-17 DIAGNOSIS — Z87440 Personal history of urinary (tract) infections: Secondary | ICD-10-CM | POA: Diagnosis not present

## 2023-04-17 DIAGNOSIS — N200 Calculus of kidney: Secondary | ICD-10-CM | POA: Diagnosis not present

## 2023-04-17 DIAGNOSIS — N202 Calculus of kidney with calculus of ureter: Secondary | ICD-10-CM | POA: Diagnosis not present

## 2023-04-17 DIAGNOSIS — N201 Calculus of ureter: Secondary | ICD-10-CM

## 2023-04-17 LAB — URINALYSIS, ROUTINE W REFLEX MICROSCOPIC
Bilirubin, UA: NEGATIVE
Glucose, UA: NEGATIVE
Ketones, UA: NEGATIVE
Nitrite, UA: NEGATIVE
Protein,UA: NEGATIVE
Specific Gravity, UA: 1.015 (ref 1.005–1.030)
Urobilinogen, Ur: 0.2 mg/dL (ref 0.2–1.0)
pH, UA: 6.5 (ref 5.0–7.5)

## 2023-04-17 LAB — MICROSCOPIC EXAMINATION: Bacteria, UA: NONE SEEN

## 2023-04-17 NOTE — Progress Notes (Signed)
Subjective:  1. Ureteral calculus   2. History of UTI   3. Renal calculi    04/17/23: Sarah Davila returns today to discuss her recent CT done on 04/12/23 in the ER.  She was found to have a 6x6x59mm left distal ureteral stone with mild hydronephrosis.  She has a cluster of 1mm stones in the LLP.  She had some left flank pain about two weeks ago and then she began to have some vaginal spasms.  She continues to have pain and urgency.   She is on tamsulosin and hydrocodone.  She has taken the suppressive antibiotic that I had given her at her last visit.   03/20/23: Sarah Davila returns today in f/u for her history of stones and UTI's.  She has had no hematuria or dysuria.  She has had no flank pain.  She still has some foamy urine at times.     09/19/22: Sarah Davila returns today in f/u from recent stone passage and UTI.   She had an e.coli on culture on 10/3/123 and again on 08/23/22.   She was given bactrim by her PCP on 09/11/22 for a week.  She was previously given keflex, ampicillin and nitrofurantoin.   She has no dysuria but has bubbles in her urine.  She has no pneumoturia or fecaluria.  She has no gross hematuria but has microhematuria.  She has no further flank pain.  She has had no fever.    08/14/22: Sarah Davila is a 62 y.o. female who is seen for evaluation of right ureteral calculus. She presented to the emergency room on 08/06/2022 with right flank pain.  No fevers, chills, nausea, or vomiting.  CT imaging showed a 3 x 3 mm right proximal ureteral calculus with moderate right-sided hydronephrosis, and bilateral renal calculi.  Her urinalysis was suspicious for UTI.  Urine culture grew 50 K E. coli.  She was treated with Rocephin and cephalexin.  She passed the stone on 08/12/2022.  She has had no further flank pain.  No dysuria or gross hematuria.  She completed her antibiotics last night.  She has a prior history of nephrolithiasis and has undergone ureteroscopy with laser lithotripsy in  2020.  Stone analysis showed mixed calcium oxalate.  She was found to have an elevated uric acid of 7.9.  24-hour urine showed a elevated urine calcium and high pH.       ROS:  ROS:  A complete review of systems was performed.  All systems are negative except for pertinent findings as noted.   Review of Systems  Musculoskeletal:  Positive for joint pain.  All other systems reviewed and are negative.   Allergies  Allergen Reactions   Ciprofloxacin Other (See Comments)    Sever hot flashes    Outpatient Encounter Medications as of 04/17/2023  Medication Sig   albuterol (VENTOLIN HFA) 108 (90 Base) MCG/ACT inhaler Inhale 2 puffs into the lungs every 6 (six) hours as needed for wheezing or shortness of breath.   aspirin EC 81 MG tablet Take 81 mg by mouth daily.   capsaicin (ZOSTRIX) 0.025 % cream Apply topically 2 (two) times daily. (Patient taking differently: Apply 1 Application topically daily as needed (Wrist pain).)   fexofenadine (ALLEGRA) 180 MG tablet Take 180 mg by mouth daily.   HYDROcodone-acetaminophen (NORCO) 5-325 MG tablet Take 1 tablet by mouth every 6 (six) hours as needed for moderate pain.   lisinopril (ZESTRIL) 20 MG tablet Take 1 tablet (20 mg total) by mouth daily.  meloxicam (MOBIC) 7.5 MG tablet One tablet daily after eating.   ondansetron (ZOFRAN-ODT) 4 MG disintegrating tablet Take 1 tablet (4 mg total) by mouth every 6 (six) hours as needed for nausea or vomiting.   Vitamin D, Ergocalciferol, (DRISDOL) 1.25 MG (50000 UNIT) CAPS capsule Take 1 capsule (50,000 Units total) by mouth every 7 (seven) days.   No facility-administered encounter medications on file as of 04/17/2023.    Past Medical History:  Diagnosis Date   Coronary artery disease    Hypertension    Kidney stone    Renal disorder    Sepsis (HCC) 2015   urinary    Past Surgical History:  Procedure Laterality Date   CYSTOSCOPY W/ URETERAL STENT PLACEMENT Bilateral 02/04/2019    Procedure: CYSTOSCOPY WITH RETROGRADE PYELOGRAM/URETERAL STENT PLACEMENT;  Surgeon: Bjorn Pippin, MD;  Location: [facility];  Service: Urology;  Laterality: Bilateral;   CYSTOSCOPY/URETEROSCOPY/HOLMIUM LASER/STENT PLACEMENT Bilateral 02/16/2019   Procedure: CYSTOSCOPY BILATERAL URETEROSCOPY WITH HOLMIUM LASER AND STENTS PLACEMENT;  Surgeon: Bjorn Pippin, MD;  Location: [facility];  Service: Urology;  Laterality: Bilateral;    Social History   Socioeconomic History   Marital status: Divorced    Spouse name: Not on file   Number of children: Not on file   Years of education: Not on file   Highest education level: Not on file  Occupational History   Not on file  Tobacco Use   Smoking status: Never   Smokeless tobacco: Never  Vaping Use   Vaping status: Never Used  Substance and Sexual Activity   Alcohol use: Never   Drug use: Never   Sexual activity: Yes    Birth control/protection: I.U.D.  Other Topics Concern   Not on file  Social History Narrative   Not on file   Social Determinants of Health   Financial Resource Strain: Low Risk  (08/30/2020)   Overall Financial Resource Strain (CARDIA)    Difficulty of Paying Living Expenses: Not hard at all  Food Insecurity: No Food Insecurity (08/30/2020)   Hunger Vital Sign    Worried About Running Out of Food in the Last Year: Never true    Ran Out of Food in the Last Year: Never true  Transportation Needs: No Transportation Needs (08/30/2020)   PRAPARE - Administrator, Civil Service (Medical): No    Lack of Transportation (Non-Medical): No  Physical Activity: Sufficiently Active (08/30/2020)   Exercise Vital Sign    Days of Exercise per Week: 7 days    Minutes of Exercise per Session: 60 min  Stress: No Stress Concern Present (08/30/2020)   [employer] of Occupational Health - Occupational Stress Questionnaire    Feeling of Stress : Not at all  Social Connections: Socially Isolated (08/30/2020)   Social Connection and  Isolation Panel [NHANES]    Frequency of Communication with Friends and Family: More than three times a week    Frequency of Social Gatherings with Friends and Family: Never    Attends Religious Services: Never    Database administrator or Organizations: No    Attends Banker Meetings: Never    Marital Status: Divorced  Catering manager Violence: Not At Risk (08/30/2020)   Humiliation, Afraid, Rape, and Kick questionnaire    Fear of Current or Ex-Partner: No    Emotionally Abused: No    Physically Abused: No    Sexually Abused: No    Family History  Problem Relation Age of Onset   Hepatitis Mother  Cirrhosis Mother    Diabetes Father    Cancer Other        Objective: Vitals:   04/17/23 0916  BP: 110/71  Pulse: 83      Physical Exam Vitals reviewed.  Constitutional:      Appearance: Normal appearance.  Cardiovascular:     Rate and Rhythm: Normal rate and regular rhythm.     Heart sounds: Normal heart sounds.  Pulmonary:     Effort: Pulmonary effort is normal. No respiratory distress.     Breath sounds: Normal breath sounds.  Neurological:     Mental Status: She is alert.     Lab Results:  UA has 11-30 WBC with moderate bacteria..   Results for orders placed or performed in visit on 04/17/23 (from the past 24 hour(s))  Urinalysis, Routine w reflex microscopic     Status: Abnormal   Collection Time: 04/17/23  9:25 AM  Result Value Ref Range   Specific Gravity, UA 1.015 1.005 - 1.030   pH, UA 6.5 5.0 - 7.5   Color, UA Yellow Yellow   Appearance Ur Clear Clear   Leukocytes,UA 1+ (A) Negative   Protein,UA Negative Negative/Trace   Glucose, UA Negative Negative   Ketones, UA Negative Negative   RBC, UA 1+ (A) Negative   Bilirubin, UA Negative Negative   Urobilinogen, Ur 0.2 0.2 - 1.0 mg/dL   Nitrite, UA Negative Negative   Microscopic Examination See below:    Narrative   Performed at:  [address] - [facility] [address],  [place], [place]  [id number] Lab Director: Chinita Pester MT, Phone:  [phone]  Microscopic Examination     Status: Abnormal   Collection Time: 04/17/23  9:25 AM   Urine  Result Value Ref Range   WBC, UA 6-10 (A) 0 - 5 /hpf   RBC, Urine 3-10 (A) 0 - 2 /hpf   Epithelial Cells (non renal) 0-10 0 - 10 /hpf   Bacteria, UA None seen None seen/Few   Narrative   Performed at:  [address] - [facility] [address], [place], [place]  [id number] Lab Director: Chinita Pester MT, Phone:  [phone]     Recent cultures reviewed.  Studies/Results:   Results for orders placed during the hospital encounter of 09/12/20  CT Renal Stone Study  Narrative CLINICAL DATA:  Right flank pain.  EXAM: CT ABDOMEN AND PELVIS WITHOUT CONTRAST  TECHNIQUE: Multidetector CT imaging of the abdomen and pelvis was performed following the standard protocol without IV contrast.  COMPARISON:  CT February 02, 2019.  FINDINGS: Lower chest: No acute abnormality.  Hepatobiliary: No focal liver abnormality is seen. Similar appearance of multiple gallstones. No gallbladder wall thickening or inflammatory change. Mild hepatomegaly.  Pancreas: Unremarkable. No pancreatic ductal dilatation or surrounding inflammatory changes.  Spleen: Normal in size without focal abnormality.  Adrenals/Urinary Tract: Normal adrenal glands. There are multiple bilateral renal calculi. The largest is on the right and measures approximately 5 mm and is interpolar location. No hydronephrosis. No ureteral calculi identified. No ureteral nephrosis.  Stomach/Bowel: Stomach is within normal limits. Appendix appears normal. No evidence of bowel wall thickening, distention, or inflammatory changes. Stool throughout the colon.  Vascular/Lymphatic: Aortic atherosclerosis. No enlarged abdominal or pelvic lymph nodes.  Reproductive: Uterus and bilateral adnexa are unremarkable. IUD in place.  Other: No significant free  fluid.  Musculoskeletal: Multilevel degenerative disc disease.  IMPRESSION: 1. Bilateral nephrolithiasis without evidence of hydronephrosis or ureteral calculus. 2. Cholelithiasis. 3. Moderate colonic stool  burden.   Electronically Signed By: Feliberto Harts MD On: 09/12/2020 14:41    Assessment & Plan: Left distal ureteral stone with obstruction.  I discussed options including MET, URS and ESWL and will get her set up for ureteroscopy.  I have reviewed the risks of bleeding, infection, urinary tract injury, need for a stent and secondary procedures, thrombotic events and anesthetic complications.    Recurrent UTI.  She on suppression and her UA is ok today.   Bilateral renal calculi.   She has small bilateral stones on her recent CT.    No orders of the defined types were placed in this encounter.    Orders Placed This Encounter  Procedures   Microscopic Examination   DG Abd 1 View    Order Specific Question:   Reason for Exam (SYMPTOM  OR DIAGNOSIS REQUIRED)    Answer:   left distal stone    Order Specific Question:   Preferred imaging location?    Answer:   [facility]    Order Specific Question:   Radiology Contrast Protocol - do NOT remove file path    Answer:   [email or web address]    Order Specific Question:   Is patient pregnant?    Answer:   No   Urinalysis, Routine w reflex microscopic      Return for scheduled surgery but keep f/u appointment currently scheduled for a post op check..   CCGilmore Laroche, FNP      Bjorn Pippin 04/18/2023

## 2023-04-17 NOTE — Telephone Encounter (Signed)
Patient is aware of MD response and voiced understanding 

## 2023-04-17 NOTE — Telephone Encounter (Signed)
-----   Message from Bjorn Pippin sent at 04/17/2023 11:00 AM EDT ----- Please let her know that stone is unchanged on the x-ray and I see we have her on the schedule  for next Tuesday.   If she gets unbearable symptoms through the weekend, she should go to the [facility] ER.

## 2023-04-18 NOTE — Progress Notes (Signed)
Anesthesia Review:  PCP: Cardiologist : Chest x-ray : EKG : Echo : Stress test: Cardiac Cath :  Activity level:  Sleep Study/ CPAP : Fasting Blood Sugar :      / Checks Blood Sugar -- times a day:   Blood Thinner/ Instructions /Last Dose: ASA / Instructions/ Last Dose :    In ED 04/12/23 Labs 04/12/23- in epic of CBC and CMP.

## 2023-04-18 NOTE — Patient Instructions (Signed)
SURGICAL WAITING ROOM VISITATION  Patients having surgery or a procedure may have no more than 2 support people in the waiting area - these visitors may rotate.    Children under the age of 68 must have an adult with them who is not the patient.  Due to an increase in RSV and influenza rates and associated hospitalizations, children ages 90 and under may not visit patients in [facility] hospitals.  If the patient needs to stay at the hospital during part of their recovery, the visitor guidelines for inpatient rooms apply. Pre-op nurse will coordinate an appropriate time for 1 support person to accompany patient in pre-op.  This support person may not rotate.    Please refer to the [facility] website for the visitor guidelines for Inpatients (after your surgery is over and you are in a regular room).       Your procedure is scheduled on:  04/22/2023    Report to [facility] Main Entrance    Report to admitting at  100pm    Call this number if you have problems the morning of surgery [phone]   Do not eat food  or drinnk liquids :After Midnight.                  If you have questions, please contact your surgeon's office.       Oral Hygiene is also important to reduce your risk of infection.                                    Remember - BRUSH YOUR TEETH THE MORNING OF SURGERY WITH YOUR REGULAR TOOTHPASTE  DENTURES WILL BE REMOVED PRIOR TO SURGERY PLEASE DO NOT APPLY "Poly grip" OR ADHESIVES!!!   Do NOT smoke after Midnight   Take these medicines the morning of surgery with A SIP OF WATER:   DO NOT TAKE ANY ORAL DIABETIC MEDICATIONS DAY OF YOUR SURGERY  Bring CPAP mask and tubing day of surgery.                              You may not have any metal on your body including hair pins, jewelry, and body piercing             Do not wear make-up, lotions, powders, perfumes/cologne, or deodorant  Do not wear nail polish including gel and S&S,  artificial/acrylic nails, or any other type of covering on natural nails including finger and toenails. If you have artificial nails, gel coating, etc. that needs to be removed by a nail salon please have this removed prior to surgery or surgery may need to be canceled/ delayed if the surgeon/ anesthesia feels like they are unable to be safely monitored.   Do not shave  48 hours prior to surgery.               Men may shave face and neck.   Do not bring valuables to the hospital. [facility] IS NOT             RESPONSIBLE   FOR VALUABLES.   Contacts, glasses, dentures or bridgework may not be worn into surgery.   Bring small overnight bag day of surgery.   DO NOT BRING YOUR HOME MEDICATIONS TO THE HOSPITAL. PHARMACY WILL DISPENSE MEDICATIONS LISTED ON YOUR MEDICATION LIST TO YOU DURING YOUR ADMISSION  IN THE HOSPITAL!    Patients discharged on the day of surgery will not be allowed to drive home.  Someone NEEDS to stay with you for the first 24 hours after anesthesia.   Special Instructions: Bring a copy of your healthcare power of attorney and living will documents the day of surgery if you haven't scanned them before.              Please read over the following fact sheets you were given: IF YOU HAVE QUESTIONS ABOUT YOUR PRE-OP INSTRUCTIONS PLEASE CALL [phone]   If you received a COVID test during your pre-op visit  it is requested that you wear a mask when out in public, stay away from anyone that may not be feeling well and notify your surgeon if you develop symptoms. If you test positive for Covid or have been in contact with anyone that has tested positive in the last 10 days please notify you surgeon.    [facility] - Preparing for Surgery Before surgery, you can play an important role.  Because skin is not sterile, your skin needs to be as free of germs as possible.  You can reduce the number of germs on your skin by washing with CHG (chlorahexidine gluconate) soap before  surgery.  CHG is an antiseptic cleaner which kills germs and bonds with the skin to continue killing germs even after washing. Please DO NOT use if you have an allergy to CHG or antibacterial soaps.  If your skin becomes reddened/irritated stop using the CHG and inform your nurse when you arrive at Short Stay. Do not shave (including legs and underarms) for at least 48 hours prior to the first CHG shower.  You may shave your face/neck. Please follow these instructions carefully:  1.  Shower with CHG Soap the night before surgery and the  morning of Surgery.  2.  If you choose to wash your hair, wash your hair first as usual with your  normal  shampoo.  3.  After you shampoo, rinse your hair and body thoroughly to remove the  shampoo.                           4.  Use CHG as you would any other liquid soap.  You can apply chg directly  to the skin and wash                       Gently with a scrungie or clean washcloth.  5.  Apply the CHG Soap to your body ONLY FROM THE NECK DOWN.   Do not use on face/ open                           Wound or open sores. Avoid contact with eyes, ears mouth and genitals (private parts).                       Wash face,  Genitals (private parts) with your normal soap.             6.  Wash thoroughly, paying special attention to the area where your surgery  will be performed.  7.  Thoroughly rinse your body with warm water from the neck down.  8.  DO NOT shower/wash with your normal soap after using and rinsing off  the CHG Soap.  9.  Pat yourself dry with a clean towel.            10.  Wear clean pajamas.            11.  Place clean sheets on your bed the night of your first shower and do not  sleep with pets. Day of Surgery : Do not apply any lotions/deodorants the morning of surgery.  Please wear clean clothes to the hospital/surgery center.  FAILURE TO FOLLOW THESE INSTRUCTIONS MAY RESULT IN THE CANCELLATION OF YOUR SURGERY PATIENT  SIGNATURE_________________________________  NURSE SIGNATURE__________________________________  ________________________________________________________________________

## 2023-04-21 ENCOUNTER — Other Ambulatory Visit: Payer: Self-pay

## 2023-04-21 ENCOUNTER — Telehealth: Payer: Self-pay

## 2023-04-21 ENCOUNTER — Encounter (HOSPITAL_COMMUNITY)
Admission: RE | Admit: 2023-04-21 | Discharge: 2023-04-21 | Disposition: A | Discharge status: Home / Self Care | Payer: Medicaid Other | Source: Ambulatory Visit | Attending: Family Medicine | Admitting: Family Medicine

## 2023-04-21 DIAGNOSIS — N201 Calculus of ureter: Secondary | ICD-10-CM | POA: Diagnosis not present

## 2023-04-21 NOTE — Telephone Encounter (Signed)
Patient passed kidney stone.  Wants to cancel surgery. Will drop stone off at office for evaluation.

## 2023-04-21 NOTE — Telephone Encounter (Signed)
My chart message sent to patient informing her to contact [facility] to cancel her surgery and to keep scheduled apt with Dr. Annabell Howells on 08/15.

## 2023-04-22 ENCOUNTER — Ambulatory Visit: Payer: Medicaid Other | Admitting: Orthopaedic Surgery

## 2023-04-22 ENCOUNTER — Encounter (HOSPITAL_COMMUNITY): Admission: RE | Payer: Self-pay | Source: Home / Self Care

## 2023-04-22 ENCOUNTER — Encounter: Payer: Self-pay | Admitting: Orthopaedic Surgery

## 2023-04-22 ENCOUNTER — Ambulatory Visit (HOSPITAL_COMMUNITY): Admission: RE | Admit: 2023-04-22 | Payer: Medicaid Other | Source: Home / Self Care | Admitting: Urology

## 2023-04-22 VITALS — BP 117/62 | HR 75 | Ht 62.0 in | Wt 238.0 lb

## 2023-04-22 DIAGNOSIS — M65341 Trigger finger, right ring finger: Secondary | ICD-10-CM

## 2023-04-22 SURGERY — CYSTOURETEROSCOPY, WITH RETROGRADE PYELOGRAM AND STENT INSERTION
Anesthesia: General | Laterality: Left

## 2023-04-22 MED ORDER — METHYLPREDNISOLONE ACETATE 40 MG/ML IJ SUSP
40.0000 mg | Freq: Once | INTRAMUSCULAR | Status: AC
Start: 2023-04-22 — End: 2023-04-22
  Administered 2023-04-22: 40 mg via INTRA_ARTICULAR

## 2023-04-22 NOTE — Progress Notes (Signed)
My finger is hurting and locking.  She has locking of the right ring finger.  I had offered her an injection last time and she declined.  She would like to have one now.  I have told her the definitive treatment for trigger finger is outpatient surgery.  She has locking and popping of the right ring finger with most pain at the A1 pulley area on the palm on the right.  She has no redness or swelling.  NV intact.  Encounter Diagnosis  Name Primary?   Trigger finger, right ring finger Yes   PROCEDURE  Trigger Finger Injection  The right Ring finger  has been locking at the A1 pulley.  The patient has been told about injection of the digit.  Surgical correction and excision of the A1 pulley will resolve the problem.  Ani injection in the digit should help but the results may be short lived.  The patient asked appropriate questions and understands the procedure.  The patient has elected for an injection at this time.  Verbal consent was obtained.  A timeout was taken to confirm the proper hand and digit.  Medication  1 mL of DepoMedrol 40  2 mL of 1% lidocaine plain  Ethyl chloride for anesthesia  Alcohol was used to prepare the skin along with ethyl chloride and then the injection was made at the A1 pulley there were no complications.  It was tolerated well.  A Band-aid dressing was applied.  Call if any problem or difficulty.  Return in two weeks.  Call if any problem.  Precautions discussed.  Electronically Signed Darreld Mclean, MD 7/16/202410:19 AM

## 2023-04-30 ENCOUNTER — Telehealth: Payer: Self-pay | Admitting: Family Medicine

## 2023-04-30 LAB — CALCULI, WITH PHOTOGRAPH (CLINICAL LAB)
Calcium Oxalate Dihydrate: 30 %
Calcium Oxalate Monohydrate: 60 %
Hydroxyapatite: 10 %
Weight Calculi: 63 mg

## 2023-04-30 NOTE — Telephone Encounter (Signed)
Patient is calling says she is having issues with her ear clogging again, but says now its getting painful and she is not sure what to do. Please advise Thank You

## 2023-05-06 ENCOUNTER — Ambulatory Visit: Payer: Medicaid Other | Admitting: Orthopaedic Surgery

## 2023-05-06 ENCOUNTER — Telehealth: Payer: Self-pay | Admitting: Family Medicine

## 2023-05-06 ENCOUNTER — Encounter: Payer: Self-pay | Admitting: Orthopaedic Surgery

## 2023-05-06 VITALS — BP 144/82 | HR 80 | Ht 62.0 in | Wt 235.0 lb

## 2023-05-06 DIAGNOSIS — M65341 Trigger finger, right ring finger: Secondary | ICD-10-CM | POA: Diagnosis not present

## 2023-05-06 NOTE — Progress Notes (Signed)
I am fine today  She had excellent results from the injection last time.  She has no triggering of the right ring finger.  Motion is full.  NV intact.  Encounter Diagnosis  Name Primary?   Trigger finger, right ring finger Yes   Return prn.  Surgery is the definitive treatment for this and she is aware of it.  Call if any problem.  Precautions discussed.  Electronically Signed Darreld Mclean, MD 7/30/20249:48 AM

## 2023-05-06 NOTE — Telephone Encounter (Signed)
Pt called in regard to ENT referral . Patient still having ear issues and would like to have referral placed

## 2023-05-07 NOTE — Telephone Encounter (Signed)
Left detailed vm, pt needs to schedule an appointment to have ear evaluated before we can refer her.

## 2023-05-07 NOTE — Telephone Encounter (Signed)
Scheduled video call for 08.12.2024.

## 2023-05-11 NOTE — Progress Notes (Unsigned)
Sarah Davila, female    DOB: 06/29/1961    MRN: [id number]   Brief patient profile:  62  yowf  never regular smoker told has asthma as child  [place]/ no recollection and moved to [facility] 1995 with new onset perennial rhinitis controlled with otc's then April 2020 voice change p ET (no ENT)  referred to pulmonary clinic in [facility]  05/12/2023 by Delories Heinz  for new onset sob x summer of 2023 mostly with voice ? Better after used saba.   Baseline wt  255    History of Present Illness  05/12/2023  Pulmonary/ 1st office eval/  / [facility] on ACEi and clariton / Biochemist, clinical Complaint  Patient presents with   Establish Care   Shortness of Breath  Dyspnea:  housekeeping for a living  Cough: months  Sleep: flat bed / one pillow no resp issues SABA use: twice ? Helped   No obvious day to day or daytime pattern/variability or assoc excess/ purulent sputum or mucus plugs or hemoptysis or cp or chest tightness, subjective wheeze or overt sinus or hb symptoms.   Sleeping  without nocturnal  or early am exacerbation  of respiratory  c/o's or need for noct saba. Also denies any obvious fluctuation of symptoms with weather or environmental changes or other aggravating or alleviating factors except as outlined above   No unusual exposure hx or h/o childhood pna/ asthma or knowledge of premature birth.  Current Allergies, Complete Past Medical History, Past Surgical History, Family History, and Social History were reviewed in [employer] record.  ROS  The following are not active complaints unless bolded Hoarseness, sore throat, dysphagia, dental problems, itching, sneezing,  nasal congestion or discharge of excess mucus or purulent secretions, ear ache,   fever, chills, sweats, unintended wt loss or wt gain, classically pleuritic or exertional cp,  orthopnea pnd or arm/hand swelling  or leg swelling, presyncope, palpitations, abdominal pain, anorexia,  nausea, vomiting, diarrhea  or change in bowel habits or change in bladder habits, change in stools or change in urine, dysuria, hematuria,  rash, arthralgias, visual complaints, headache, numbness, weakness or ataxia or problems with walking or coordination,  change in mood or  memory.                Outpatient Medications Prior to Visit  Medication Sig Dispense Refill   albuterol (VENTOLIN HFA) 108 (90 Base) MCG/ACT inhaler Inhale 2 puffs into the lungs every 6 (six) hours as needed for wheezing or shortness of breath. 8 g 2   aspirin EC 81 MG tablet Take 81 mg by mouth daily.     capsaicin (ZOSTRIX) 0.025 % cream Apply topically 2 (two) times daily. (Patient taking differently: Apply 1 Application topically daily as needed (Wrist pain).) 60 g 0   fexofenadine (ALLEGRA) 180 MG tablet Take 180 mg by mouth daily.     HYDROcodone-acetaminophen (NORCO) 5-325 MG tablet Take 1 tablet by mouth every 6 (six) hours as needed for moderate pain. 10 tablet 0   lisinopril (ZESTRIL) 20 MG tablet Take 1 tablet (20 mg total) by mouth daily. 90 tablet 3   loratadine (CLARITIN) 10 MG tablet Take 10 mg by mouth daily.     meloxicam (MOBIC) 7.5 MG tablet One tablet daily after eating. 30 tablet 5   ondansetron (ZOFRAN-ODT) 4 MG disintegrating tablet Take 1 tablet (4 mg total) by mouth every 6 (six) hours as needed for nausea or vomiting. 15 tablet 0  tamsulosin (FLOMAX) 0.4 MG CAPS capsule Take 0.4 mg by mouth daily after breakfast.     Vitamin D, Ergocalciferol, (DRISDOL) 1.25 MG (50000 UNIT) CAPS capsule Take 1 capsule (50,000 Units total) by mouth every 7 (seven) days. 10 capsule 1   sulfamethoxazole-trimethoprim (BACTRIM DS) 800-160 MG tablet Take 0.5 tablets by mouth at bedtime.     No facility-administered medications prior to visit.    Past Medical History:  Diagnosis Date   Coronary artery disease    Hypertension    Kidney stone    Renal disorder    Sepsis (HCC) 2015   urinary       Objective:     BP 115/74   Pulse 66   Ht 5\' 2"  (1.575 m)   Wt 235 lb (106.6 kg)   LMP 03/15/2013 (Approximate) Comment: LMP @ age 62. Pt has Mirena IUD  SpO2 96%   BMI 42.98 kg/m   SpO2: 96 % RA  Amb MO (by BMI)    HEENT : Oropharynx  clear     Nasal turbinates nl / bilateral TM scarring    NECK :  without  apparent JVD/ palpable Nodes/TM    LUNGS: no acc muscle use,  Nl contour chest which is clear to A and P bilaterally without cough on insp or exp maneuvers   CV:  RRR  no s3 or murmur or increase in P2, and LE puffy but  no edema   ABD:  soft and nontender with nl inspiratory excursion in the supine position. No bruits or organomegaly appreciated   MS:  Nl gait/ ext warm without deformities Or obvious joint restrictions  calf tenderness, cyanosis or clubbing    SKIN: warm and dry without lesions    NEURO:  alert, approp, nl sensorium with  no motor or cerebellar deficits apparent.    CXR PA and Lateral:   05/12/2023 :    I personally reviewed images and impression is as follows:     Mild CM/ No acute findings       Assessment   Upper airway cough syndrome Onset ? 2023 at wt 255 in setting of perennial rhinitis  - Try off ACEi 05/12/2023  - 05/12/2023 instructed on approp use of afrin/ flonase  - ENT referral 05/12/2023 for ear pain/ TM scarring >>>   Upper airway cough syndrome (previously labeled PNDS),  is so named because it's frequently impossible to sort out how much is  CR/sinusitis with freq throat clearing (which can be related to primary GERD)   vs  causing  secondary (" extra esophageal")  GERD from wide swings in gastric pressure that occur with throat clearing, often  promoting self use of mint and menthol lozenges that reduce the lower esophageal sphincter tone and exacerbate the problem further in a cyclical fashion.   These are the same pts (now being labeled as having "irritable larynx syndrome" by some cough centers) who not infrequently have  a history of having failed to tolerate ace inhibitors(as is likely here,  dry powder inhalers or biphosphonates or report having atypical/extraesophageal reflux symptoms that don't respond to standard doses of PPI  and are easily confused as having aecopd or asthma flares by even experienced allergists/ pulmonologists (myself included).   Rec: Try off acei first Rx afrin /flonase x 3 days only as has h/o rhinitis medicamentosa ENT eval  Cxr to be complete     Essential hypertension D/c ACEi for cough and sob with voice use 05/12/2023  ACE inhibitors are problematic in  pts with airway complaints because  even experienced pulmonologists can't   distinguish ace effects from copd/asthma.  By themselves they don't actually cause a problem, much like oxygen can't by itself start a fire, but they certainly serve as a powerful catalyst or enhancer for any "fire"  or inflammatory process in the upper airway, be it caused by an ET  tube(as may have been the case here)  or more commonly allergies or reflux (especially in the obese or pts with known GERD or who are on biphoshonates).   Regroup in 6 weeks to see if additional w/u needed   In the era of ARB near equivalency until we have a better handle on the reversibility of the airway problem, it just makes sense to avoid ACEI  entirely in the short run and then decide later, having established a level of airway control using a reasonable limited regimen, whether to add back ace but even then being very careful to observe the pt for worsening airway control and number of meds used/ needed to control symptoms.       Morbid obesity due to excess calories (HCC) Body mass index is 42.98 kg/m.  -  trending dowb Lab Results  Component Value Date   TSH 1.350 10/23/2022     Contributing to doe and risk of GERD/dvt/PE >>>   reviewed the need and the process to achieve and maintain neg calorie balance > defer f/u primary care including intermittently  monitoring thyroid status           Each maintenance medication was reviewed in detail including emphasizing most importantly the difference between maintenance and prns and under what circumstances the prns are to be triggered using an action plan format where appropriate.  Total time for H and P, chart review, counseling, reviewing hfa/nasal device(s) and generating customized AVS unique to this office visit / same day charting = 45 min with pt new to me        Sandrea Hughs, MD 05/12/2023

## 2023-05-12 ENCOUNTER — Ambulatory Visit (HOSPITAL_COMMUNITY)
Admission: RE | Admit: 2023-05-12 | Discharge: 2023-05-12 | Disposition: A | Discharge status: Home / Self Care | Payer: Medicaid Other | Source: Ambulatory Visit | Attending: Internal Medicine | Admitting: Internal Medicine

## 2023-05-12 ENCOUNTER — Ambulatory Visit: Payer: Medicaid Other | Admitting: Internal Medicine

## 2023-05-12 ENCOUNTER — Encounter: Payer: Self-pay | Admitting: Internal Medicine

## 2023-05-12 VITALS — BP 115/74 | HR 66 | Ht 62.0 in | Wt 235.0 lb

## 2023-05-12 DIAGNOSIS — R058 Other specified cough: Secondary | ICD-10-CM

## 2023-05-12 DIAGNOSIS — I10 Essential (primary) hypertension: Secondary | ICD-10-CM

## 2023-05-12 DIAGNOSIS — I700 Atherosclerosis of aorta: Secondary | ICD-10-CM | POA: Diagnosis not present

## 2023-05-12 DIAGNOSIS — J9811 Atelectasis: Secondary | ICD-10-CM | POA: Diagnosis not present

## 2023-05-12 DIAGNOSIS — R059 Cough, unspecified: Secondary | ICD-10-CM | POA: Diagnosis not present

## 2023-05-12 MED ORDER — OLMESARTAN MEDOXOMIL 20 MG PO TABS: 20.0000 mg | ORAL_TABLET | Freq: Every day | ORAL | 11 refills | Status: DC

## 2023-05-12 NOTE — Assessment & Plan Note (Addendum)
Body mass index is 42.98 kg/m.  -  trending dowb Lab Results  Component Value Date   TSH 1.350 10/23/2022      Contributing to doe and risk of GERD/dvt/PE >>>   reviewed the need and the process to achieve and maintain neg calorie balance > defer f/u primary care including intermittently monitoring thyroid status            Each maintenance medication was reviewed in detail including emphasizing most importantly the difference between maintenance and prns and under what circumstances the prns are to be triggered using an action plan format where appropriate.  Total time for H and P, chart review, counseling, reviewing hfa/nasal device(s) and generating customized AVS unique to this office visit / same day charting = 45 min with pt new to me

## 2023-05-12 NOTE — Assessment & Plan Note (Addendum)
Onset ? 2023 at wt 255 in setting of perennial rhinitis  - Try off ACEi 05/12/2023  - 05/12/2023 instructed on approp use of afrin/ flonase  - ENT referral 05/12/2023 for ear pain/ TM scarring >>>   Upper airway cough syndrome (previously labeled PNDS),  is so named because it's frequently impossible to sort out how much is  CR/sinusitis with freq throat clearing (which can be related to primary GERD)   vs  causing  secondary (" extra esophageal")  GERD from wide swings in gastric pressure that occur with throat clearing, often  promoting self use of mint and menthol lozenges that reduce the lower esophageal sphincter tone and exacerbate the problem further in a cyclical fashion.   These are the same pts (now being labeled as having "irritable larynx syndrome" by some cough centers) who not infrequently have a history of having failed to tolerate ace inhibitors(as is likely here,  dry powder inhalers or biphosphonates or report having atypical/extraesophageal reflux symptoms that don't respond to standard doses of PPI  and are easily confused as having aecopd or asthma flares by even experienced allergists/ pulmonologists (myself included).   Rec: Try off acei first Rx afrin /flonase x 3 days only as has h/o rhinitis medicamentosa ENT eval  Cxr to be complete

## 2023-05-12 NOTE — Assessment & Plan Note (Addendum)
D/c ACEi for cough and sob with voice use 05/12/2023   ACE inhibitors are problematic in  pts with airway complaints because  even experienced pulmonologists can't   distinguish ace effects from copd/asthma.  By themselves they don't actually cause a problem, much like oxygen can't by itself start a fire, but they certainly serve as a powerful catalyst or enhancer for any "fire"  or inflammatory process in the upper airway, be it caused by an ET  tube(as may have been the case here)  or more commonly allergies or reflux (especially in the obese or pts with known GERD or who are on biphoshonates).   Regroup in 6 weeks to see if additional w/u needed   In the era of ARB near equivalency until we have a better handle on the reversibility of the airway problem, it just makes sense to avoid ACEI  entirely in the short run and then decide later, having established a level of airway control using a reasonable limited regimen, whether to add back ace but even then being very careful to observe the pt for worsening airway control and number of meds used/ needed to control symptoms.

## 2023-05-12 NOTE — Patient Instructions (Addendum)
Stop lisinopril and replace with benicar (olmesartan) 20 mg one daily    My office will be contacting you by phone for referral to ENT   - if you don't hear back from my office within one week please call us back or notify us thru MyChart and we'll address it right away.   I emphasized that nasal steroids (flonase)  have no immediate benefit in terms of improving symptoms.  To help them reached the target tissue, the patient should use Afrin two puffs every 12 hours applied one min before using the nasal steroids.  Afrin should be stopped after no more than 5 days.  If the symptoms worsen, Afrin can be restarted after 3 days off of therapy to prevent rebound congestion from overuse of Afrin.  I also emphasized that in no way are nasal steroids a concern in terms of "addiction".    Please remember to go to the  x-ray department  for your tests - we will call you with the results when they are available    Please schedule a follow up office visit in 6 weeks, call sooner if needed - bring inhaler

## 2023-05-19 ENCOUNTER — Other Ambulatory Visit: Payer: Self-pay | Admitting: Family Medicine

## 2023-05-19 ENCOUNTER — Telehealth: Payer: Medicaid Other | Admitting: Family Medicine

## 2023-05-19 DIAGNOSIS — E559 Vitamin D deficiency, unspecified: Secondary | ICD-10-CM

## 2023-05-22 ENCOUNTER — Other Ambulatory Visit: Payer: Medicaid Other | Admitting: Urology

## 2023-05-27 ENCOUNTER — Ambulatory Visit: Payer: 59 | Admitting: Adult Health

## 2023-06-03 ENCOUNTER — Ambulatory Visit: Payer: 59 | Admitting: Family Medicine

## 2023-06-22 NOTE — Progress Notes (Unsigned)
Sarah Davila, female    DOB: [birth year]    MRN: [id number]   Brief patient profile:  22 yowf  never regular smoker told has asthma as child  [place]/ no recollection and moved to [facility] 1995 with new onset perennial rhinitis controlled with otc's then April 2020 voice change p ET (no ENT eval )  referred to pulmonary clinic in [place]  05/12/2023 by Sarah Davila  for new onset sob x summer of 2023 mostly with voice ? Better after used saba.   Baseline wt  255    History of Present Illness  05/12/2023  Pulmonary/ 1st office eval/ Sarah Davila / [facility] on ACEi and clariton / Biochemist, clinical Complaint  Patient presents with   Establish Care   Shortness of Breath  Dyspnea:  housekeeping for a living  Cough: months  Sleep: flat bed / one pillow no resp issues SABA use: twice ? Helped  Rec Stop lisinopril and replace with benicar (olmesartan) 20 mg one daily  My office will be contacting you by phone for referral to ENT   I emphasized that nasal steroids (flonase)  have no immediate benefit in    06/23/2023  f/u ov/[facility]/Sarah Davila re: ? Acei case  maint on allegra and zyrtec for nasal congestion  Chief Complaint  Patient presents with   Upper airway cough syndrome   Dyspnea:  none Cough: gone  Sleeping: flat bed / one pillow s    resp cc  SABA use: none 02: none Not using afrin/flonase as per last ov but when she does it does help open up her R Ear, already has appt with ent     No obvious day to day or daytime variability or assoc excess/ purulent sputum or mucus plugs or hemoptysis or cp or chest tightness, subjective wheeze or overt sinus or hb symptoms.    Also denies any obvious fluctuation of symptoms with weather or environmental changes or other aggravating or alleviating factors except as outlined above   No unusual exposure hx or h/o childhood pna  or knowledge of premature birth.  Current Allergies, Complete Past Medical History, Past Surgical History,  Family History, and Social History were reviewed in [employer] record.  ROS  The following are not active complaints unless bolded Hoarseness, sore throat, dysphagia, dental problems, itching, sneezing,  nasal congestion or discharge of excess mucus or purulent secretions, ear ache,   fever, chills, sweats, unintended wt loss or wt gain, classically pleuritic or exertional cp,  orthopnea pnd or arm/hand swelling  or leg swelling, presyncope, palpitations, abdominal pain, anorexia, nausea, vomiting, diarrhea  or change in bowel habits or change in bladder habits, change in stools or change in urine, dysuria, hematuria,  rash, arthralgias, visual complaints, headache, numbness, weakness or ataxia or problems with walking or coordination,  change in mood or  memory.        Current Meds  Medication Sig   albuterol (VENTOLIN HFA) 108 (90 Base) MCG/ACT inhaler Inhale 2 puffs into the lungs every 6 (six) hours as needed for wheezing or shortness of breath.   aspirin EC 81 MG tablet Take 81 mg by mouth daily.   BLACK COHOSH PO Take by mouth.   capsaicin (ZOSTRIX) 0.025 % cream Apply topically 2 (two) times daily. (Patient taking differently: Apply 1 Application topically daily as needed (Wrist pain).)   fexofenadine (ALLEGRA) 180 MG tablet Take 180 mg by mouth daily.   HYDROcodone-acetaminophen (NORCO) 5-325 MG tablet Take  1 tablet by mouth every 6 (six) hours as needed for moderate pain.   loratadine (CLARITIN) 10 MG tablet Take 10 mg by mouth daily.   meloxicam (MOBIC) 7.5 MG tablet One tablet daily after eating.   olmesartan (BENICAR) 20 MG tablet Take 1 tablet (20 mg total) by mouth daily.   ondansetron (ZOFRAN-ODT) 4 MG disintegrating tablet Take 1 tablet (4 mg total) by mouth every 6 (six) hours as needed for nausea or vomiting.   tamsulosin (FLOMAX) 0.4 MG CAPS capsule Take 0.4 mg by mouth daily after breakfast.   Vitamin D, Ergocalciferol, (DRISDOL) 1.25 MG (50000 UNIT)  CAPS capsule TAKE 1 CAPSULE BY MOUTH ONCE A WEEK (EVERY SEVEN DAYS)           Past Medical History:  Diagnosis Date   Coronary artery disease    Hypertension    Kidney stone    Renal disorder    Sepsis (HCC) 2015   urinary      Objective:    Wt Readings from Last 3 Encounters:  06/23/23 242 lb (109.8 kg)  05/12/23 235 lb (106.6 kg)  05/06/23 235 lb (106.6 kg)      Vital signs reviewed  06/23/2023  - Note at rest 02 sats  95% on RA   General appearance:    obese wf nl exam x scarring R TM      HEENT : Oropharynx  clear     Nasal turbinates mild edema R ear with scarring TM    NECK :  without  apparent JVD/ palpable Nodes/TM    LUNGS: no acc muscle use,  Nl contour chest which is clear to A and P bilaterally without cough on insp or exp maneuvers   CV:  RRR  no s3 or murmur or increase in P2, and no edema   ABD:  soft and nontender with nl inspiratory excursion in the supine position. No bruits or organomegaly appreciated   MS:  Nl gait/ ext warm without deformities Or obvious joint restrictions  calf tenderness, cyanosis or clubbing    SKIN: warm and dry without lesions    NEURO:  alert, approp, nl sensorium with  no motor or cerebellar deficits apparent.         Assessment

## 2023-06-23 ENCOUNTER — Encounter: Payer: Self-pay | Admitting: Internal Medicine

## 2023-06-23 ENCOUNTER — Ambulatory Visit: Payer: Medicaid Other | Admitting: Internal Medicine

## 2023-06-23 VITALS — BP 144/83 | HR 83 | Ht 62.0 in | Wt 242.0 lb

## 2023-06-23 DIAGNOSIS — I10 Essential (primary) hypertension: Secondary | ICD-10-CM

## 2023-06-23 DIAGNOSIS — R058 Other specified cough: Secondary | ICD-10-CM

## 2023-06-23 NOTE — Assessment & Plan Note (Addendum)
D/c ACEi for cough and sob with voice use 05/12/2023 > resolved 06/23/2023   Although even in retrospect it may not be clear the ACEi contributed to the pt's symptoms,  she cleared improved off them and adding them back at this point or in the future would risk confusion in interpretation of non-specific respiratory symptoms to which this patient is prone  ie  Better not to muddy the waters here.   >>> continue benicar 20 mg daily and f/u with PCP as planned           Each maintenance medication was reviewed in detail including emphasizing most importantly the difference between maintenance and prns and under what circumstances the prns are to be triggered using an action plan format where appropriate.  Total time for H and P, chart review, counseling, reviewing nasal  device(s) and generating customized AVS unique to this office visit / same day charting = summary final ov

## 2023-06-23 NOTE — Assessment & Plan Note (Signed)
Onset ? 2023 at wt 255 in setting of perennial rhinitis  - Try off ACEi 05/12/2023 > cough resolved as of 06/23/2023  - 05/12/2023 instructed on approp use of afrin/ flonase  - ENT referral 05/12/2023 for ear pain/ TM scarring >>>   Cough has resolved and she has classic eustachian tube dysfunction some better p afrin/ flonase and just needs to member the 5 day on / 5 day on rule   Discussed the recent press about ppi's in the context of a statistically significant (but questionably clinically relevant) increase in CRI in pts on ppi vs h2's > bottom line is the lowest dose of ppi that controls   gerd is the right dose and if that dose is zero that's fine esp since h2's are cheaper.  See taper schedule to pepcid and off if tol, if not PCP f/u and GI prn  Pulmonary f/u can be prn

## 2023-06-23 NOTE — Patient Instructions (Signed)
No pulmonary follow up   If not satisfied after you ENT, give me a call for allergy referral.

## 2023-06-27 ENCOUNTER — Ambulatory Visit: Payer: Medicaid Other | Admitting: Family Medicine

## 2023-07-01 DIAGNOSIS — R490 Dysphonia: Secondary | ICD-10-CM | POA: Insufficient documentation

## 2023-07-01 DIAGNOSIS — J383 Other diseases of vocal cords: Secondary | ICD-10-CM | POA: Diagnosis not present

## 2023-07-03 ENCOUNTER — Other Ambulatory Visit: Payer: Medicaid Other | Admitting: Urology

## 2023-07-03 DIAGNOSIS — J383 Other diseases of vocal cords: Secondary | ICD-10-CM | POA: Insufficient documentation

## 2023-07-07 ENCOUNTER — Ambulatory Visit: Payer: Medicaid Other | Admitting: Adult Health

## 2023-07-11 ENCOUNTER — Ambulatory Visit: Payer: Medicaid Other | Admitting: Family Medicine

## 2023-07-17 ENCOUNTER — Encounter: Payer: Self-pay | Admitting: Family Medicine

## 2023-07-17 ENCOUNTER — Ambulatory Visit: Payer: Medicaid Other | Admitting: Family Medicine

## 2023-07-17 VITALS — BP 145/77 | HR 77 | Ht 62.0 in | Wt 247.1 lb

## 2023-07-17 DIAGNOSIS — G479 Sleep disorder, unspecified: Secondary | ICD-10-CM

## 2023-07-17 DIAGNOSIS — R252 Cramp and spasm: Secondary | ICD-10-CM

## 2023-07-17 MED ORDER — HYDROXYZINE PAMOATE 25 MG PO CAPS
25.0000 mg | ORAL_CAPSULE | Freq: Every evening | ORAL | 0 refills | Status: DC | PRN
Start: 2023-07-17 — End: 2023-07-29

## 2023-07-17 NOTE — Assessment & Plan Note (Signed)
The patient reports frequent awakenings at night, which disturb her sleep. She would like to be started on pharmacological therapy to help with sleep maintenance. Sleep hygiene practices were discussed, and she was encouraged to start taking hydroxyzine 25 mg at bedtime.

## 2023-07-17 NOTE — Assessment & Plan Note (Signed)
The patient is encouraged to increase fluid intake to at least 64 ounces daily, perform stretching and strengthening exercises, and massage the affected site. A BMP is pending.

## 2023-07-17 NOTE — Patient Instructions (Addendum)
I appreciate the opportunity to provide care to you today!    Follow up:  08/01/2023  Labs: please stop by the lab today to get your blood drawn (BMP)  Leg Cramps -Vitamin B complex (BID times daily, containing 30 mg of vitamin B6)   Sleep disturbance -start taking hydroxyzine 25 mg at bedtime  Non-Pharmacological Management for Sleep Hygiene:  Establish a Consistent Bedtime Routine: -Develop and adhere to a regular sleep and wake schedule. -Avoid using electronic devices, including computers and smartphones, at least one hour before bedtime. -If unable to fall asleep within 15 minutes, refrain from staying in bed and engage in a relaxing activity until you feel sleepy. Reduce Daily Stress: -Engage in stress-reducing activities before bedtime to help relax your mind and body. Avoid intense physical exercise and stimulant use, such as caffeine, late in the day. Optimize Sleep Environment: -Use the bed and bedroom exclusively for sleep and intimate activities. -Consider removing electronic devices from the sleeping area and limit screen time prior to bedtime. Incorporate Relaxation Techniques: -Practice abdominal breathing and meditation to promote relaxation. Utilize progressive muscle relaxation and visualization techniques to aid in achieving restful sleep.    Attached with your AVS, you will find valuable resources for self-education. I highly recommend dedicating some time to thoroughly examine them.   Please continue to a heart-healthy diet and increase your physical activities. Try to exercise for at least five days a week.    It was a pleasure to see you and I look forward to continuing to work together on your health and well-being. Please do not hesitate to call the office if you need care or have questions about your care.  In case of emergency, please visit the Emergency Department for urgent care, or contact our clinic at [phone] to schedule an  appointment. We're here to help you!   Have a wonderful day and week. With Gratitude, Gilmore Laroche MSN, FNP-BC

## 2023-07-17 NOTE — Progress Notes (Signed)
Acute Office Visit  Subjective:    Patient ID: Sarah Davila, female    DOB: [birth year], 62 y.o.   MRN: [id number]  Chief Complaint  Patient presents with   Leg Pain    Pt reports left thigh charlie horse started a week ago.     HPI The patient is in today reporting the resolution of a bad charley horse cramp in her inner right thigh that occurred a week ago. Since then, she has been increasing her fluid and electrolyte intake. No redness, swelling, or tenderness to palpation is noted at the affected site. She denies chest pain, shortness of breath, or chest tightness.    Past Medical History:  Diagnosis Date   Coronary artery disease    Hypertension    Kidney stone    Renal disorder    Sepsis (HCC) 2015   urinary    Past Surgical History:  Procedure Laterality Date   CYSTOSCOPY W/ URETERAL STENT PLACEMENT Bilateral 02/04/2019   Procedure: CYSTOSCOPY WITH RETROGRADE PYELOGRAM/URETERAL STENT PLACEMENT;  Surgeon: Bjorn Pippin, MD;  Location: [facility];  Service: Urology;  Laterality: Bilateral;   CYSTOSCOPY/URETEROSCOPY/HOLMIUM LASER/STENT PLACEMENT Bilateral 02/16/2019   Procedure: CYSTOSCOPY BILATERAL URETEROSCOPY WITH HOLMIUM LASER AND STENTS PLACEMENT;  Surgeon: Bjorn Pippin, MD;  Location: [facility];  Service: Urology;  Laterality: Bilateral;    Family History  Problem Relation Age of Onset   Hepatitis Mother    Cirrhosis Mother    Diabetes Father    Cancer Other     Social History   Socioeconomic History   Marital status: Single    Spouse name: Not on file   Number of children: Not on file   Years of education: Not on file   Highest education level: Not on file  Occupational History   Not on file  Tobacco Use   Smoking status: Never   Smokeless tobacco: Never  Vaping Use   Vaping status: Never Used  Substance and Sexual Activity   Alcohol use: Never   Drug use: Never   Sexual activity: Yes    Birth control/protection: I.U.D.  Other Topics Concern   Not  on file  Social History Narrative   Not on file   Social Determinants of Health   Financial Resource Strain: Low Risk  (08/30/2020)   Overall Financial Resource Strain (CARDIA)    Difficulty of Paying Living Expenses: Not hard at all  Food Insecurity: Low Risk  (07/01/2023)   Received from [facility]   Hunger Vital Sign    Worried About Running Out of Food in the Last Year: Never true    Ran Out of Food in the Last Year: Never true  Transportation Needs: No Transportation Needs (07/01/2023)   Received from [facility]    In the past 12 months, has lack of reliable transportation kept you from medical appointments, meetings, work or from getting things needed for daily living? : No  Physical Activity: Sufficiently Active (08/30/2020)   Exercise Vital Sign    Days of Exercise per Week: 7 days    Minutes of Exercise per Session: 60 min  Stress: No Stress Concern Present (08/30/2020)   [employer] of Occupational Health - Occupational Stress Questionnaire    Feeling of Stress : Not at all  Social Connections: Socially Isolated (08/30/2020)   Social Connection and Isolation Panel [NHANES]    Frequency of Communication with Friends and Family: More than three times a week    Frequency of Social  Gatherings with Friends and Family: Never    Attends Religious Services: Never    Database administrator or Organizations: No    Attends Banker Meetings: Never    Marital Status: Divorced  Catering manager Violence: Not At Risk (08/30/2020)   Humiliation, Afraid, Rape, and Kick questionnaire    Fear of Current or Ex-Partner: No    Emotionally Abused: No    Physically Abused: No    Sexually Abused: No    Outpatient Medications Prior to Visit  Medication Sig Dispense Refill   albuterol (VENTOLIN HFA) 108 (90 Base) MCG/ACT inhaler Inhale 2 puffs into the lungs every 6 (six) hours as needed for wheezing or shortness of breath. 8 g 2   aspirin EC 81 MG  tablet Take 81 mg by mouth daily.     BLACK COHOSH PO Take by mouth.     capsaicin (ZOSTRIX) 0.025 % cream Apply topically 2 (two) times daily. (Patient taking differently: Apply 1 Application topically daily as needed (Wrist pain).) 60 g 0   fexofenadine (ALLEGRA) 180 MG tablet Take 180 mg by mouth daily.     HYDROcodone-acetaminophen (NORCO) 5-325 MG tablet Take 1 tablet by mouth every 6 (six) hours as needed for moderate pain. 10 tablet 0   loratadine (CLARITIN) 10 MG tablet Take 10 mg by mouth daily.     meloxicam (MOBIC) 7.5 MG tablet One tablet daily after eating. 30 tablet 5   olmesartan (BENICAR) 20 MG tablet Take 1 tablet (20 mg total) by mouth daily. 30 tablet 11   ondansetron (ZOFRAN-ODT) 4 MG disintegrating tablet Take 1 tablet (4 mg total) by mouth every 6 (six) hours as needed for nausea or vomiting. 15 tablet 0   tamsulosin (FLOMAX) 0.4 MG CAPS capsule Take 0.4 mg by mouth daily after breakfast.     Vitamin D, Ergocalciferol, (DRISDOL) 1.25 MG (50000 UNIT) CAPS capsule TAKE 1 CAPSULE BY MOUTH ONCE A WEEK (EVERY SEVEN DAYS) 10 capsule 0   No facility-administered medications prior to visit.    Allergies  Allergen Reactions   Ciprofloxacin Other (See Comments)    Sever hot flashes    Review of Systems  Constitutional:  Negative for chills and fever.  Eyes:  Negative for visual disturbance.  Respiratory:  Negative for chest tightness and shortness of breath.   Neurological:  Negative for dizziness and headaches.       Objective:    Physical Exam HENT:     Head: Normocephalic.     Mouth/Throat:     Mouth: Mucous membranes are moist.  Cardiovascular:     Rate and Rhythm: Normal rate.     Heart sounds: Normal heart sounds.  Pulmonary:     Effort: Pulmonary effort is normal.     Breath sounds: Normal breath sounds.  Neurological:     Mental Status: She is alert.     BP (!) 145/77 (BP Location: Left Arm)   Pulse 77   Ht 5\' 2"  (1.575 m)   Wt 247 lb 1.3 oz  (112.1 kg)   LMP 03/15/2013 (Approximate) Comment: LMP @ age 72. Pt has Mirena IUD  SpO2 97%   BMI 45.19 kg/m  Wt Readings from Last 3 Encounters:  07/17/23 247 lb 1.3 oz (112.1 kg)  06/23/23 242 lb (109.8 kg)  05/12/23 235 lb (106.6 kg)       Assessment & Plan:  Cramps of lower extremity Assessment & Plan: The patient is encouraged to increase fluid intake to at  least 64 ounces daily, perform stretching and strengthening exercises, and massage the affected site. A BMP is pending.    Orders: -     BMP8+EGFR -     D-dimer, quantitative  Sleep disturbance Assessment & Plan: The patient reports frequent awakenings at night, which disturb her sleep. She would like to be started on pharmacological therapy to help with sleep maintenance. Sleep hygiene practices were discussed, and she was encouraged to start taking hydroxyzine 25 mg at bedtime.   Orders: -     hydrOXYzine Pamoate; Take 1 capsule (25 mg total) by mouth at bedtime as needed.  Dispense: 30 capsule; Refill: 0  Note: This chart has been completed using Engineer, civil (consulting) software, and while attempts have been made to ensure accuracy, certain words and phrases may not be transcribed as intended.    Gilmore Laroche, FNP

## 2023-07-18 LAB — BMP8+EGFR
BUN/Creatinine Ratio: 19 (ref 12–28)
BUN: 14 mg/dL (ref 8–27)
CO2: 21 mmol/L (ref 20–29)
Calcium: 9.4 mg/dL (ref 8.7–10.3)
Chloride: 106 mmol/L (ref 96–106)
Creatinine, Ser: 0.72 mg/dL (ref 0.57–1.00)
Glucose: 92 mg/dL (ref 70–99)
Potassium: 5.1 mmol/L (ref 3.5–5.2)
Sodium: 141 mmol/L (ref 134–144)
eGFR: 96 mL/min/{1.73_m2} (ref >59)

## 2023-07-29 ENCOUNTER — Telehealth: Payer: Self-pay | Admitting: Family Medicine

## 2023-07-29 ENCOUNTER — Other Ambulatory Visit: Payer: Self-pay

## 2023-07-29 DIAGNOSIS — G479 Sleep disorder, unspecified: Secondary | ICD-10-CM

## 2023-07-29 MED ORDER — HYDROXYZINE PAMOATE 25 MG PO CAPS
25.0000 mg | ORAL_CAPSULE | Freq: Every evening | ORAL | 2 refills | Status: DC | PRN
Start: 2023-07-29 — End: 2023-11-04

## 2023-07-29 NOTE — Telephone Encounter (Signed)
Patient is calling says the medication she was prescribed has been helping her but the bottle says no refills. She is wanting as refill before next appointment. Please advise Thank you

## 2023-07-29 NOTE — Telephone Encounter (Signed)
Pt asking for refill on hydroxyzine refills sent

## 2023-07-31 ENCOUNTER — Other Ambulatory Visit: Payer: Self-pay | Admitting: Family Medicine

## 2023-07-31 DIAGNOSIS — E559 Vitamin D deficiency, unspecified: Secondary | ICD-10-CM

## 2023-08-01 ENCOUNTER — Ambulatory Visit: Payer: Medicaid Other | Admitting: Family Medicine

## 2023-08-04 ENCOUNTER — Ambulatory Visit: Payer: Medicaid Other | Admitting: Internal Medicine

## 2023-08-07 ENCOUNTER — Other Ambulatory Visit: Payer: Medicaid Other | Admitting: Urology

## 2023-08-13 ENCOUNTER — Ambulatory Visit: Payer: Medicaid Other | Admitting: Adult Health

## 2023-08-13 ENCOUNTER — Other Ambulatory Visit (HOSPITAL_COMMUNITY): Payer: Self-pay | Admitting: Family Medicine

## 2023-08-13 DIAGNOSIS — Z1231 Encounter for screening mammogram for malignant neoplasm of breast: Secondary | ICD-10-CM

## 2023-09-03 ENCOUNTER — Ambulatory Visit: Payer: Medicaid Other | Admitting: Family Medicine

## 2023-09-12 ENCOUNTER — Ambulatory Visit (HOSPITAL_COMMUNITY): Payer: Medicaid Other

## 2023-09-26 ENCOUNTER — Encounter (HOSPITAL_COMMUNITY): Payer: Self-pay

## 2023-09-26 ENCOUNTER — Ambulatory Visit (HOSPITAL_COMMUNITY)
Admission: RE | Admit: 2023-09-26 | Discharge: 2023-09-26 | Disposition: A | Discharge status: Home / Self Care | Payer: Medicaid Other | Source: Ambulatory Visit | Attending: Family Medicine | Admitting: Family Medicine

## 2023-09-26 ENCOUNTER — Ambulatory Visit: Payer: Medicaid Other | Admitting: Family Medicine

## 2023-09-26 DIAGNOSIS — Z1231 Encounter for screening mammogram for malignant neoplasm of breast: Secondary | ICD-10-CM | POA: Insufficient documentation

## 2023-10-10 ENCOUNTER — Ambulatory Visit: Payer: Medicaid Other | Admitting: Adult Health

## 2023-10-22 ENCOUNTER — Encounter (INDEPENDENT_AMBULATORY_CARE_PROVIDER_SITE_OTHER): Payer: Self-pay | Admitting: *Deleted

## 2023-10-30 ENCOUNTER — Ambulatory Visit (INDEPENDENT_AMBULATORY_CARE_PROVIDER_SITE_OTHER): Payer: Medicaid Other | Admitting: Orthopaedic Surgery

## 2023-10-30 ENCOUNTER — Encounter: Payer: Self-pay | Admitting: Orthopaedic Surgery

## 2023-10-30 VITALS — BP 135/74 | HR 67 | Ht 62.0 in | Wt 245.0 lb

## 2023-10-30 DIAGNOSIS — M65341 Trigger finger, right ring finger: Secondary | ICD-10-CM

## 2023-10-30 NOTE — Progress Notes (Signed)
My finger is acting up again.  She has triggering of the right dominant ring finger.  I did an injection in July 2024 and it helped until this last month.  It is bothering her all the time.  It locks only occasionally.  She would like something done for it.  I have told her about surgery and she has agreed to have this done.  Right ring finger has triggering and pain at the A1 pulley area.  NV intact.  Encounter Diagnosis  Name Primary?   Trigger finger, right ring finger Yes   To see Dr. Romeo Apple or Dr. Dallas Schimke for surgery.  Call if any problem.  Precautions discussed.  Electronically Signed Darreld Mclean, MD 1/23/20258:46 AM

## 2023-10-30 NOTE — Patient Instructions (Signed)
Dr Romeo Apple or Dallas Schimke for surgical consult hand

## 2023-11-04 ENCOUNTER — Other Ambulatory Visit: Payer: Self-pay | Admitting: Family Medicine

## 2023-11-04 DIAGNOSIS — G479 Sleep disorder, unspecified: Secondary | ICD-10-CM

## 2023-11-07 ENCOUNTER — Ambulatory Visit: Payer: Medicaid Other | Admitting: Adult Health

## 2023-11-11 ENCOUNTER — Ambulatory Visit: Payer: Medicaid Other | Admitting: Orthopedic Surgery

## 2023-11-18 ENCOUNTER — Ambulatory Visit: Payer: Medicaid Other | Admitting: Orthopedic Surgery

## 2023-11-19 ENCOUNTER — Telehealth: Payer: Self-pay | Admitting: *Deleted

## 2023-11-19 NOTE — Telephone Encounter (Addendum)
Called patient back and spoke with her regarding question about charges for visit on 10/30/23. I advised she was scheduled for an injection, which no copay would have been due,but after seeing Dr. Hilda Davila it was decided not to give an injection and surgery was discussed.This was changed to an office visit, therefore, patient does owe for co-pay.Patient was scheduled to see Dr. Dallas Schimke to schedule surgery 12/02/23.

## 2023-12-02 ENCOUNTER — Encounter: Payer: Self-pay | Admitting: Orthopedic Surgery

## 2023-12-02 ENCOUNTER — Ambulatory Visit: Payer: Medicaid Other | Admitting: Orthopedic Surgery

## 2023-12-02 VITALS — BP 149/85 | HR 78

## 2023-12-02 DIAGNOSIS — M65341 Trigger finger, right ring finger: Secondary | ICD-10-CM | POA: Diagnosis not present

## 2023-12-02 NOTE — Progress Notes (Signed)
 New Patient Visit  Assessment: Sarah Davila is a 63 y.o. female with the following: Right ring finger trigger finger  Plan: Symptoms and physical exam most consistent with trigger finger.  We discussed etiology, and potential treatment options.  Surgery was discussed in detail, including the plan for procedure, and expected recovery.  She has previously had an injection, and the pain and triggering has returned.  As a result, she is interested in surgery.  Procedure was discussed in detail.  She will discuss surgery with family, and work, contact the clinic when she is ready to proceed.  She states that she will most likely proceed with surgery on a Thursday.  Risks and benefits of the surgery, including, but not limited to infection, bleeding, persistent pain, need for further surgery, recurrence of symptoms and more severe complications associated with anesthesia were discussed with the patient.  The patient has elected to proceed.   Follow-up: Return for After surgery.  Subjective:  Chief Complaint  Patient presents with   Hand Pain    Bilat hand triggering, numbness, and weakness. L hand started recently    History of Present Illness: Sarah Davila is a 63 y.o. female who presents for evaluation of Right Ring finger pain.  She has previously been followed by Dr. Hilda Lias.  She had an injection in the right ring finger, A1 pulley area approximately 6 months ago.  She had excellent response following the injection.  She return for repeat evaluation, and ultimately referred to clinic for further discussion regarding the procedure.  She is not actively having triggering at this point.  She is having some pain in the area of the A1 pulley.  Pain is worse than it was before she had the injection.  Otherwise, the injection was very successful.  No numbness or tingling in the right hand.  She is having some numbness and tingling in the left hand.   Review of Systems: No fevers or  chills No numbness or tingling No chest pain No shortness of breath No bowel or bladder dysfunction No GI distress No headaches   Medical History:  Past Medical History:  Diagnosis Date   Coronary artery disease    Hypertension    Kidney stone    Renal disorder    Sepsis (HCC) 2015   urinary    Past Surgical History:  Procedure Laterality Date   BREAST BIOPSY Left 2014   benign   CYSTOSCOPY W/ URETERAL STENT PLACEMENT Bilateral 02/04/2019   Procedure: CYSTOSCOPY WITH RETROGRADE PYELOGRAM/URETERAL STENT PLACEMENT;  Surgeon: Bjorn Pippin, MD;  Location: [facility];  Service: Urology;  Laterality: Bilateral;   CYSTOSCOPY/URETEROSCOPY/HOLMIUM LASER/STENT PLACEMENT Bilateral 02/16/2019   Procedure: CYSTOSCOPY BILATERAL URETEROSCOPY WITH HOLMIUM LASER AND STENTS PLACEMENT;  Surgeon: Bjorn Pippin, MD;  Location: [facility];  Service: Urology;  Laterality: Bilateral;    Family History  Problem Relation Age of Onset   Hepatitis Mother    Cirrhosis Mother    Diabetes Father    Cancer Other    Social History   Tobacco Use   Smoking status: Never   Smokeless tobacco: Never  Vaping Use   Vaping status: Never Used  Substance Use Topics   Alcohol use: Never   Drug use: Never    Allergies  Allergen Reactions   Ciprofloxacin Other (See Comments)    Sever hot flashes    Current Meds  Medication Sig   albuterol (VENTOLIN HFA) 108 (90 Base) MCG/ACT inhaler Inhale 2 puffs into the lungs every  6 (six) hours as needed for wheezing or shortness of breath.   aspirin EC 81 MG tablet Take 81 mg by mouth daily.   BLACK COHOSH PO Take by mouth.   capsaicin (ZOSTRIX) 0.025 % cream Apply topically 2 (two) times daily. (Patient taking differently: Apply 1 Application topically daily as needed (Wrist pain).)   fexofenadine (ALLEGRA) 180 MG tablet Take 180 mg by mouth daily.   HYDROcodone-acetaminophen (NORCO) 5-325 MG tablet Take 1 tablet by mouth every 6 (six) hours as needed for moderate pain.    hydrOXYzine (VISTARIL) 25 MG capsule TAKE 1 CAPSULE BY MOUTH AT BEDTIME AS NEEDED   loratadine (CLARITIN) 10 MG tablet Take 10 mg by mouth daily.   meloxicam (MOBIC) 7.5 MG tablet One tablet daily after eating.   olmesartan (BENICAR) 20 MG tablet Take 1 tablet (20 mg total) by mouth daily.   ondansetron (ZOFRAN-ODT) 4 MG disintegrating tablet Take 1 tablet (4 mg total) by mouth every 6 (six) hours as needed for nausea or vomiting.   tamsulosin (FLOMAX) 0.4 MG CAPS capsule Take 0.4 mg by mouth daily after breakfast.   Turmeric (QC TUMERIC COMPLEX PO) Take by mouth.   Vitamin D, Ergocalciferol, (DRISDOL) 1.25 MG (50000 UNIT) CAPS capsule Take 1 capsule by mouth once a week    Objective: BP (!) 149/85   Pulse 78   LMP 03/15/2013 (Approximate) Comment: LMP @ age 38. Pt has Mirena IUD  Physical Exam:  General: Alert and oriented. and No acute distress. Gait: Normal gait.  Right hand without deformity.  No bruising.  Tenderness palpation over the A1 pulley of the ring finger.  She is able to make a fist.  Negative Tinel's.  Negative Phalen's.  No active triggering in clinic today.  Fingers warm well-perfused.  IMAGING: No new imaging obtained today   New Medications:  No orders of the defined types were placed in this encounter.     [name], MD  12/02/2023 4:29 PM

## 2023-12-04 ENCOUNTER — Telehealth: Payer: Self-pay | Admitting: Orthopedic Surgery

## 2023-12-04 NOTE — Telephone Encounter (Signed)
 Dr. Dallas Schimke pt - spoke w/the pt, she was here yesterday, she is ready to move forward w/surgery and would like to know when that can be setup.

## 2023-12-06 ENCOUNTER — Other Ambulatory Visit: Payer: Self-pay | Admitting: Family Medicine

## 2023-12-06 DIAGNOSIS — E559 Vitamin D deficiency, unspecified: Secondary | ICD-10-CM

## 2023-12-08 ENCOUNTER — Other Ambulatory Visit: Payer: Self-pay | Admitting: Family Medicine

## 2023-12-08 DIAGNOSIS — G479 Sleep disorder, unspecified: Secondary | ICD-10-CM

## 2023-12-09 NOTE — Telephone Encounter (Signed)
 LVM for pt to call back with some date options. Advised her of Dr. Dallas Schimke surgery dates and call back information given.

## 2023-12-11 ENCOUNTER — Ambulatory Visit: Payer: Medicaid Other | Admitting: Adult Health

## 2023-12-16 ENCOUNTER — Telehealth: Payer: Self-pay | Admitting: Orthopedic Surgery

## 2023-12-16 NOTE — Telephone Encounter (Signed)
 Dr. Dallas Schimke pt - pt lvm stating that she needs to figure out a day for surgery.  [phone]

## 2023-12-17 ENCOUNTER — Ambulatory Visit: Payer: Self-pay | Admitting: Orthopedic Surgery

## 2023-12-17 NOTE — Telephone Encounter (Signed)
 Spoke w/ pt who would like to schedule surgery for 01/08/24. Please assist or advise if needs to be changed.

## 2023-12-25 ENCOUNTER — Other Ambulatory Visit: Payer: Self-pay

## 2023-12-25 DIAGNOSIS — M65341 Trigger finger, right ring finger: Secondary | ICD-10-CM

## 2024-01-05 NOTE — Patient Instructions (Signed)
 Sarah Davila  01/05/2024     @PREFPERIOPPHARMACY @   Your procedure is scheduled on  01/08/2024.   Report to [facility] at  0600 A.M.   Call this number if you have problems the morning of surgery:  [phone]  If you experience any cold or flu symptoms such as cough, fever, chills, shortness of breath, etc. between now and your scheduled surgery, please notify us at the above number.   Remember:         Use your inhaler before you come and bring your rescue inhaler with you.    Do not eat after midnight.   You may drink clear liquids until  0330 am on 01/08/2024.    Clear liquids allowed are:                    Water, Juice (No red color; non-citric and without pulp; diabetics please choose diet or no sugar options), Carbonated beverages (diabetics please choose diet or no sugar options), Clear Tea (No creamer, milk, or cream, including half & half and powdered creamer), Black Coffee Only (No creamer, milk or cream, including half & half and powdered creamer), and Clear Sports drink (No red color; diabetics please choose diet or no sugar options)         At 0330 am on 01/08/2024 drink your carb drink. You can have nothing else after this.   Take these medicines the morning of surgery with A SIP OF WATER                                   meloxicam (if needed), flomax.    Do not wear jewelry, make-up or nail polish, including gel polish,  artificial nails, or any other type of covering on natural nails (fingers and  toes).  Do not wear lotions, powders, or perfumes, or deodorant.  Do not shave 48 hours prior to surgery.  Men may shave face and neck.  Do not bring valuables to the hospital.  [facility] is not responsible for any belongings or valuables.  Contacts, dentures or bridgework may not be worn into surgery.  Leave your suitcase in the car.  After surgery it may be brought to your room.  For patients admitted to the hospital, discharge time will be  determined by your treatment team.  Patients discharged the day of surgery will not be allowed to drive home and must have someone with them for 24 hours.    Special instructions:   DO NOT smoke tobacco or vape for 24 hors before your procedure.  Please read over the following fact sheets that you were given. Coughing and Deep Breathing, Surgical Site Infection Prevention, Anesthesia Post-op Instructions, and Care and Recovery After Surgery      Trigger Finger Release, Care After After trigger finger release, it is common to have: Stiffness. Soreness. Swelling. Follow these instructions at home: Incision care  Keep the compression bandage on for 48 hours or as told by your health care provider. After removing it, follow instructions about how to take care of your incision. Make sure you: Wash your hands with soap and water for at least 20 seconds before and after you change your bandage (dressing). If soap and water are not available, use hand sanitizer. Change your dressing as told by your provider. Leave stitches (sutures), skin glue, or tape strips in place. These  skin closures may need to stay in place for 2 weeks or longer. If tape strip edges start to loosen and curl up, you may trim the loose edges. Do not remove tape strips completely unless your provider tells you to do that. Keep your hand and dressing clean and dry. Check your incision area every day for signs of infection. Check for: More redness, swelling, or pain. Fluid or blood. Warmth. Pus or a bad smell. Bathing Do not take baths, swim, or use a hot tub until your provider approves. Keep your dressing dry until your provider says it can be removed. Cover it with a watertight covering when you take a bath or a shower. Managing pain, stiffness, and swelling  If told, put ice on your palm. Put ice in a plastic bag. Place a towel between your skin and the bag. Leave the ice on for 20 minutes, 2-3 times a day. If  your skin turns bright red, remove the ice right away to prevent skin damage. The risk of damage is higher if you cannot feel pain, heat, or cold. Move your fingers often to reduce stiffness and swelling. Raise (elevate) your hand above the level of your heart while you are sitting or lying down. Activity If you were given a sedative during the procedure, it can affect you for several hours. Do not drive or operate machinery until your provider says that it is safe. You may have to avoid lifting. Ask your provider how much you can safely lift. Avoid any activity that causes pain. It may take 4-6 months for stiffness to go away. Return to your normal activities as told by your provider. Ask your provider what activities are safe for you. If hand therapy was prescribed, do exercises as told. This will help you regain movement. General instructions Take over-the-counter and prescription medicines only as told by your provider. Do not use any products that contain nicotine or tobacco. These products include cigarettes, chewing tobacco, and vaping devices, such as e-cigarettes. If you need help quitting, ask your provider. Keep all follow-up visits. If you have sutures, these will be removed in about 10-14 days. Your health care provider may give you more instructions. Make sure you know what you can and cannot do. Contact a health care provider if: You have chills or fever. You have any signs of infection. You are unable to move your finger because of pain or stiffness. You have any tingling or numbness in your hand or fingers. This information is not intended to replace advice given to you by your health care provider. Make sure you discuss any questions you have with your health care provider. Document Revised: 05/07/2022 Document Reviewed: 05/07/2022 Elsevier Patient Education  2024 [employer].General Anesthesia, Adult, Care After The following information offers guidance on how to care for  yourself after your procedure. Your health care provider may also give you more specific instructions. If you have problems or questions, contact your health care provider. What can I expect after the procedure? After the procedure, it is common for people to: Have pain or discomfort at the IV site. Have nausea or vomiting. Have a sore throat or hoarseness. Have trouble concentrating. Feel cold or chills. Feel weak, sleepy, or tired (fatigue). Have soreness and body aches. These can affect parts of the body that were not involved in surgery. Follow these instructions at home: For the time period you were told by your health care provider:  Rest. Do not participate in activities where  you could fall or become injured. Do not drive or use machinery. Do not drink alcohol. Do not take sleeping pills or medicines that cause drowsiness. Do not make important decisions or sign legal documents. Do not take care of children on your own. General instructions Drink enough fluid to keep your urine pale yellow. If you have sleep apnea, surgery and certain medicines can increase your risk for breathing problems. Follow instructions from your health care provider about wearing your sleep device: Anytime you are sleeping, including during daytime naps. While taking prescription pain medicines, sleeping medicines, or medicines that make you drowsy. Return to your normal activities as told by your health care provider. Ask your health care provider what activities are safe for you. Take over-the-counter and prescription medicines only as told by your health care provider. Do not use any products that contain nicotine or tobacco. These products include cigarettes, chewing tobacco, and vaping devices, such as e-cigarettes. These can delay incision healing after surgery. If you need help quitting, ask your health care provider. Contact a health care provider if: You have nausea or vomiting that does not get  better with medicine. You vomit every time you eat or drink. You have pain that does not get better with medicine. You cannot urinate or have bloody urine. You develop a skin rash. You have a fever. Get help right away if: You have trouble breathing. You have chest pain. You vomit blood. These symptoms may be an emergency. Get help right away. Call 911. Do not wait to see if the symptoms will go away. Do not drive yourself to the hospital. Summary After the procedure, it is common to have a sore throat, hoarseness, nausea, vomiting, or to feel weak, sleepy, or fatigue. For the time period you were told by your health care provider, do not drive or use machinery. Get help right away if you have difficulty breathing, have chest pain, or vomit blood. These symptoms may be an emergency. This information is not intended to replace advice given to you by your health care provider. Make sure you discuss any questions you have with your health care provider. Document Revised: 12/21/2021 Document Reviewed: 12/21/2021 Elsevier Patient Education  2024 Else[employer] Inc.How to Use Chlorhexidine at Home in the Shower Chlorhexidine gluconate (CHG) is a germ-killing (antiseptic) wash that's used to clean the skin. It can get rid of the germs that normally live on the skin and can keep them away for about 24 hours. If you're having surgery, you may be told to shower with CHG at home the night before surgery. This can help lower your risk for infection. To use CHG wash in the shower, follow the steps below. Supplies needed: CHG body wash. Clean washcloth. Clean towel. How to use CHG in the shower Follow these steps unless you're told to use CHG in a different way: Start the shower. Use your normal soap and shampoo to wash your face and hair. Turn off the shower or move out of the shower stream. Pour CHG onto a clean washcloth. Do not use any type of brush or rough sponge. Start at your neck, washing your  body down to your toes. Make sure you: Wash the part of your body where the surgery will be done for at least 1 minute. Do not scrub. Do not use CHG on your head or face unless your health care provider tells you to. If it gets into your ears or eyes, rinse them well with water. Do not  wash your genitals with CHG. Wash your back and under your arms. Make sure to wash skin folds. Let the CHG sit on your skin for 1-2 minutes or as long as told. Rinse your entire body in the shower, including all body creases and folds. Turn off the shower. Dry off with a clean towel. Do not put anything on your skin afterward, such as powder, lotion, or perfume. Put on clean clothes or pajamas. If it's the night before surgery, sleep in clean sheets. General tips Use CHG only as told, and follow the instructions on the label. Use the full amount of CHG as told. This is often one bottle. Do not smoke and stay away from flames after using CHG. Your skin may feel sticky after using CHG. This is normal. The sticky feeling will go away as the CHG dries. Do not use CHG: If you have a chlorhexidine allergy or have reacted to chlorhexidine in the past. On open wounds or areas of skin that have broken skin, cuts, or scrapes. On babies younger than 66 months of age. Contact a health care provider if: You have questions about using CHG. Your skin gets irritated or itchy. You have a rash after using CHG. You swallow any CHG. Call your local poison control center [phone] in the [place] Your eyes itch badly, or they become very red or swollen. Your hearing changes. You have trouble seeing. If you can't reach your provider, go to an urgent care or emergency room. Do not drive yourself. Get help right away if: You have swelling or tingling in your mouth or throat. You make high-pitched whistling sounds when you breathe, most often when you breathe out (wheeze). You have trouble breathing. These symptoms may be an  emergency. Call 911 right away. Do not wait to see if the symptoms will go away. Do not drive yourself to the hospital. This information is not intended to replace advice given to you by your health care provider. Make sure you discuss any questions you have with your health care provider. Document Revised: 04/08/2023 Document Reviewed: 04/04/2022 Elsevier Patient Education  2024 [employer].

## 2024-01-06 ENCOUNTER — Encounter (HOSPITAL_COMMUNITY): Payer: Self-pay

## 2024-01-06 ENCOUNTER — Encounter (HOSPITAL_COMMUNITY)
Admission: RE | Admit: 2024-01-06 | Discharge: 2024-01-06 | Disposition: A | Discharge status: Home / Self Care | Source: Ambulatory Visit | Attending: Orthopedic Surgery | Admitting: Orthopedic Surgery

## 2024-01-06 VITALS — BP 142/70 | HR 88 | Temp 98.0°F | Resp 18 | Ht 62.0 in | Wt 244.9 lb

## 2024-01-06 DIAGNOSIS — Z01810 Encounter for preprocedural cardiovascular examination: Secondary | ICD-10-CM | POA: Diagnosis not present

## 2024-01-06 DIAGNOSIS — I10 Essential (primary) hypertension: Secondary | ICD-10-CM | POA: Diagnosis not present

## 2024-01-06 DIAGNOSIS — Z01818 Encounter for other preprocedural examination: Secondary | ICD-10-CM | POA: Diagnosis present

## 2024-01-07 NOTE — Anesthesia Preprocedure Evaluation (Signed)
 Anesthesia Evaluation  Patient identified by MRN, date of birth, ID band Patient awake    Reviewed: Allergy & Precautions, H&P , NPO status , Patient's Chart, lab work & pertinent test results  Airway Mallampati: III  TM Distance: >3 FB Neck ROM: Full  Mouth opening: Limited Mouth Opening  Dental no notable dental hx. (+) Teeth Intact, Dental Advisory Given   Pulmonary shortness of breath Denies OSA or any pulm issues    Pulmonary exam normal breath sounds clear to auscultation       Cardiovascular Exercise Tolerance: Good hypertension, Pt. on medications and Pt. on home beta blockers + CAD  Normal cardiovascular examI Rhythm:Regular Rate:Normal  Denies CAD or CP States can walk a mile  Denies NTG use    Neuro/Psych negative neurological ROS  negative psych ROS   GI/Hepatic negative GI ROS, Neg liver ROS,,,  Endo/Other    Class 3 obesity  Renal/GU Renal diseaseH/o Stones -prox here for cysto stent   negative genitourinary   Musculoskeletal negative musculoskeletal ROS (+)    Abdominal   Peds negative pediatric ROS (+)  Hematology negative hematology ROS (+)   Anesthesia Other Findings   Reproductive/Obstetrics negative OB ROS                             Anesthesia Physical Anesthesia Plan  ASA: 3  Anesthesia Plan: General   Post-op Pain Management: Minimal or no pain anticipated and Tylenol PO (pre-op)*   Induction: Intravenous  PONV Risk Score and Plan: Ondansetron, Dexamethasone and Midazolam  Airway Management Planned: LMA  Additional Equipment: None  Intra-op Plan:   Post-operative Plan: Extubation in OR  Informed Consent: I have reviewed the patients History and Physical, chart, labs and discussed the procedure including the risks, benefits and alternatives for the proposed anesthesia with the patient or authorized representative who has indicated his/her  understanding and acceptance.     Dental advisory given  Plan Discussed with: CRNA  Anesthesia Plan Comments:         Anesthesia Quick Evaluation

## 2024-01-08 ENCOUNTER — Ambulatory Visit (HOSPITAL_COMMUNITY)
Admission: RE | Admit: 2024-01-08 | Discharge: 2024-01-08 | Disposition: A | Discharge status: Home / Self Care | Attending: Orthopedic Surgery | Admitting: Orthopedic Surgery

## 2024-01-08 ENCOUNTER — Encounter (HOSPITAL_COMMUNITY): Payer: Self-pay | Admitting: Orthopedic Surgery

## 2024-01-08 ENCOUNTER — Ambulatory Visit (HOSPITAL_BASED_OUTPATIENT_CLINIC_OR_DEPARTMENT_OTHER): Payer: Self-pay | Admitting: Anesthesiology

## 2024-01-08 ENCOUNTER — Encounter (HOSPITAL_COMMUNITY)
Admission: RE | Disposition: A | Discharge status: Home / Self Care | Payer: Self-pay | Source: Home / Self Care | Attending: Orthopedic Surgery

## 2024-01-08 ENCOUNTER — Ambulatory Visit (HOSPITAL_COMMUNITY): Payer: Self-pay | Admitting: Anesthesiology

## 2024-01-08 DIAGNOSIS — I10 Essential (primary) hypertension: Secondary | ICD-10-CM | POA: Diagnosis not present

## 2024-01-08 DIAGNOSIS — I2510 Atherosclerotic heart disease of native coronary artery without angina pectoris: Secondary | ICD-10-CM | POA: Insufficient documentation

## 2024-01-08 DIAGNOSIS — M65341 Trigger finger, right ring finger: Secondary | ICD-10-CM

## 2024-01-08 DIAGNOSIS — Z79899 Other long term (current) drug therapy: Secondary | ICD-10-CM | POA: Diagnosis not present

## 2024-01-08 DIAGNOSIS — E66813 Obesity, class 3: Secondary | ICD-10-CM | POA: Insufficient documentation

## 2024-01-08 DIAGNOSIS — Z6841 Body Mass Index (BMI) 40.0 and over, adult: Secondary | ICD-10-CM | POA: Diagnosis not present

## 2024-01-08 HISTORY — PX: TRIGGER FINGER RELEASE: SHX641

## 2024-01-08 SURGERY — RELEASE, A1 PULLEY, FOR TRIGGER FINGER
Anesthesia: General | Site: Finger | Laterality: Right

## 2024-01-08 MED ORDER — LIDOCAINE HCL (CARDIAC) PF 100 MG/5ML IV SOSY
PREFILLED_SYRINGE | INTRAVENOUS | Status: DC | PRN
Start: 2024-01-08 — End: 2024-01-08
  Administered 2024-01-08: 75 mg via INTRATRACHEAL

## 2024-01-08 MED ORDER — SUGAMMADEX SODIUM 200 MG/2ML IV SOLN
INTRAVENOUS | Status: DC | PRN
  Administered 2024-01-08: 200 mg via INTRAVENOUS

## 2024-01-08 MED ORDER — BUPIVACAINE HCL (PF) 0.5 % IJ SOLN
INTRAMUSCULAR | Status: AC
  Filled 2024-01-08: qty 30

## 2024-01-08 MED ORDER — LACTATED RINGERS IV SOLN: INTRAVENOUS | Status: DC | PRN

## 2024-01-08 MED ORDER — FENTANYL CITRATE PF 50 MCG/ML IJ SOSY: 25.0000 ug | PREFILLED_SYRINGE | INTRAMUSCULAR | Status: DC | PRN

## 2024-01-08 MED ORDER — CEFAZOLIN SODIUM-DEXTROSE 2-4 GM/100ML-% IV SOLN
2.0000 g | INTRAVENOUS | Status: AC
Start: 2024-01-08 — End: 2024-01-08
  Administered 2024-01-08: 2 g via INTRAVENOUS
  Filled 2024-01-08: qty 100

## 2024-01-08 MED ORDER — OXYCODONE HCL 5 MG/5ML PO SOLN: 5.0000 mg | Freq: Once | ORAL | Status: DC | PRN

## 2024-01-08 MED ORDER — PHENYLEPHRINE 80 MCG/ML (10ML) SYRINGE FOR IV PUSH (FOR BLOOD PRESSURE SUPPORT)
PREFILLED_SYRINGE | INTRAVENOUS | Status: DC | PRN
  Administered 2024-01-08: 80 ug via INTRAVENOUS

## 2024-01-08 MED ORDER — SUCCINYLCHOLINE 20MG/ML (10ML) SYRINGE FOR MEDFUSION PUMP - OPTIME
INTRAMUSCULAR | Status: DC | PRN
  Administered 2024-01-08: 120 mg via INTRAVENOUS

## 2024-01-08 MED ORDER — ACETAMINOPHEN 160 MG/5ML PO SOLN
960.0000 mg | Freq: Once | ORAL | Status: AC
  Filled 2024-01-08: qty 30

## 2024-01-08 MED ORDER — OXYCODONE HCL 5 MG PO TABS: 5.0000 mg | ORAL_TABLET | Freq: Once | ORAL | Status: DC | PRN

## 2024-01-08 MED ORDER — ACETAMINOPHEN 500 MG PO TABS
1000.0000 mg | ORAL_TABLET | Freq: Once | ORAL | Status: AC
  Administered 2024-01-08: 1000 mg via ORAL
  Filled 2024-01-08: qty 2

## 2024-01-08 MED ORDER — ORAL CARE MOUTH RINSE: 15.0000 mL | Freq: Once | OROMUCOSAL | Status: AC

## 2024-01-08 MED ORDER — SODIUM CHLORIDE 0.9 % IR SOLN
Status: DC | PRN
  Administered 2024-01-08: 1000 mL

## 2024-01-08 MED ORDER — HYDROCODONE-ACETAMINOPHEN 5-325 MG PO TABS: 1.0000 | ORAL_TABLET | Freq: Four times a day (QID) | ORAL | 0 refills | Status: AC | PRN

## 2024-01-08 MED ORDER — CHLORHEXIDINE GLUCONATE 0.12 % MT SOLN
15.0000 mL | Freq: Once | OROMUCOSAL | Status: AC
  Administered 2024-01-08: 15 mL via OROMUCOSAL
  Filled 2024-01-08: qty 15

## 2024-01-08 MED ORDER — BUPIVACAINE HCL (PF) 0.5 % IJ SOLN
INTRAMUSCULAR | Status: DC | PRN
  Administered 2024-01-08: 10 mL

## 2024-01-08 MED ORDER — ROCURONIUM BROMIDE 100 MG/10ML IV SOLN
INTRAVENOUS | Status: DC | PRN
  Administered 2024-01-08: 30 mg via INTRAVENOUS

## 2024-01-08 MED ORDER — SODIUM CHLORIDE 0.9 % IV SOLN: 12.5000 mg | INTRAVENOUS | Status: DC | PRN

## 2024-01-08 MED ORDER — FENTANYL CITRATE (PF) 100 MCG/2ML IJ SOLN
INTRAMUSCULAR | Status: DC | PRN
  Administered 2024-01-08: 75 ug via INTRAVENOUS
  Administered 2024-01-08: 25 ug via INTRAVENOUS

## 2024-01-08 MED ORDER — FENTANYL CITRATE (PF) 100 MCG/2ML IJ SOLN
INTRAMUSCULAR | Status: AC
  Filled 2024-01-08: qty 2

## 2024-01-08 MED ORDER — PROPOFOL 10 MG/ML IV BOLUS
INTRAVENOUS | Status: DC | PRN
  Administered 2024-01-08: 200 mg via INTRAVENOUS
  Administered 2024-01-08: 50 mg via INTRAVENOUS

## 2024-01-08 SURGICAL SUPPLY — 29 items
BANDAGE ESMARK 4X12 BL STRL LF (DISPOSABLE) ×1 IMPLANT
BLADE SURG 15 STRL LF DISP TIS (BLADE) ×1 IMPLANT
BNDG ELASTIC 3X5.8 VLCR NS LF (GAUZE/BANDAGES/DRESSINGS) ×1 IMPLANT
BNDG ESMARK 4X12 BLUE STRL LF (DISPOSABLE) ×1 IMPLANT
CHLORAPREP W/TINT 26 (MISCELLANEOUS) ×1 IMPLANT
CORD BIPOLAR FORCEPS 12FT (ELECTRODE) ×1 IMPLANT
COVER LIGHT HANDLE STERIS (MISCELLANEOUS) ×2 IMPLANT
CUFF TOURN SGL QUICK 18X4 (TOURNIQUET CUFF) ×1 IMPLANT
DRAPE HALF SHEET 40X57 (DRAPES) ×1 IMPLANT
GAUZE SPONGE 4X4 12PLY STRL (GAUZE/BANDAGES/DRESSINGS) ×1 IMPLANT
GAUZE XEROFORM 1X8 LF (GAUZE/BANDAGES/DRESSINGS) ×1 IMPLANT
GLOVE BIO SURGEON STRL SZ8 (GLOVE) ×1 IMPLANT
GLOVE BIOGEL PI IND STRL 7.0 (GLOVE) ×2 IMPLANT
GLOVE BIOGEL PI IND STRL 8 (GLOVE) ×1 IMPLANT
GOWN STRL REUS W/TWL LRG LVL3 (GOWN DISPOSABLE) ×1 IMPLANT
GOWN STRL REUS W/TWL XL LVL3 (GOWN DISPOSABLE) ×1 IMPLANT
KIT TURNOVER KIT A (KITS) ×1 IMPLANT
MANIFOLD NEPTUNE II (INSTRUMENTS) ×1 IMPLANT
NDL HYPO 21X1.5 SAFETY (NEEDLE) ×1 IMPLANT
NEEDLE HYPO 21X1.5 SAFETY (NEEDLE) ×1 IMPLANT
NS IRRIG 1000ML POUR BTL (IV SOLUTION) ×1 IMPLANT
PACK BASIC LIMB (CUSTOM PROCEDURE TRAY) ×1 IMPLANT
PAD ARMBOARD POSITIONER FOAM (MISCELLANEOUS) ×1 IMPLANT
POSITIONER HAND ALUMI XLG (MISCELLANEOUS) ×1 IMPLANT
POSITIONER HEAD 8X9X4 ADT (SOFTGOODS) ×1 IMPLANT
SET BASIN LINEN APH (SET/KITS/TRAYS/PACK) ×1 IMPLANT
SUT PROLENE NAB BLUE 3-0 30IN (SUTURE) ×1 IMPLANT
SYR CONTROL 10ML LL (SYRINGE) ×1 IMPLANT
UNDERPAD 30X36 HEAVY ABSORB (UNDERPADS AND DIAPERS) ×1 IMPLANT

## 2024-01-08 NOTE — Op Note (Signed)
 Orthopaedic Surgery Operative Note (CSN: [id number])  [name]  05/16/1961 Date of Surgery: 01/08/2024   Diagnoses:  Right ring finger trigger finger  Procedure: Trigger finger release Right Ring finger   Operative Finding Successful completion of the planned procedure.     Post-Op Diagnosis: Same Surgeons:Primary: [name], MD Assistants: None Location: [facility] OR ROOM 4 Anesthesia: General with local anesthesia Antibiotics: Ancef 2 g Tourniquet time:  Total Tourniquet Time Documented: Upper Arm (Right) - 18 minutes Total: Upper Arm (Right) - 18 minutes  Estimated Blood Loss: Minimal Complications: None Specimens: None  Implants: None  Indications for Surgery:   Sarah Davila is a 63 y.o. female with a Right Ring finger trigger finger.  The patient is complaining of pain over the A1 pulley with progressively worsening triggering.  They have previously had injections, which improved the symptoms, but did not resolve the current symptoms.  Benefits and risks of operative and nonoperative management were discussed prior to surgery with patient and informed consent form was completed.  Specific risks including infection, need for additional surgery, damage to surrounding structures, recurrence and more severe complications associated with anesthesia were discussed.  They have elected to proceed with surgery.    Procedure:   The patient was identified properly. Informed consent was obtained and the surgical site was marked. The patient was taken to the OR suite where the above stated anesthesia was induced.  The patient was positioned supine on a hand table.  The right arm was prepped and draped in the usual sterile fashion.  Timeout was performed before the beginning of the case.  Tourniquet was used for the above duration.  The patient received antibiotics prior to the incision.   We made an incision directly overlying the A1 pulley to the Ring finger.  We carefully  incised sharply through the skin only.  We continued with blunt dissection to the level of the A1 pulley.  We identified the adjacent neurovascular bundles throughout the case.  The leading edge of the A1 pulley was identified and incised sharply with a knife.  We then used scissors to completely release the pulley distally, and then proximally.  The A1 pulley was noted to be very thick with some irritation of the underlying tendon.  We then used a tendon hook to remove the tendon from its usual position to ensure that the A1 pulley was released and the tendon moving freely.  We passively flexed and extended the Ring finger and noted there were no additional adhesions or catching.  We irrigated the wound copiously.  We closed the incision with multiple interrupted sutures.  Marcaine 0.5% was then injected at the incision site.  Sterile dressing was placed.  Patient was awoken taken to PACU in stable condition.   Post-operative plan:  The patient will be discharged home. They can removed the bulky dressing in 3 days.   DVT prophylaxis not indicated in this ambulatory upper extremity patient without significant risk factors.    Patient will be provided with a limited prescription of narcotics for severe pain.  OTC medications can be used as needed.  Follow up plan will be scheduled in approximately 7-10 days for suture removal and incision check.

## 2024-01-08 NOTE — Progress Notes (Addendum)
 Pt stated she didn't feel right. Obtained VS, O2 and BP were not within normal limits. Placed O2 via nasal cannula at 2L/min on pt.  Dr. Leta Jungling called and came to assess pt. Gave order to restart IV. Placed pt in trendelenburg position in recliner. Pt tolerated 2 cups of water to drink. BP and O2 returned to normal limits and no IV bolus was needed. Dr. Leta Jungling made aware. Pt did not want to get dressed and preferred to go home in hospital gown and socks.

## 2024-01-08 NOTE — Transfer of Care (Signed)
 Immediate Anesthesia Transfer of Care Note  Patient: Sarah Davila  Procedure(s) Performed: RELEASE, A1 PULLEY, FOR TRIGGER FINGER (Right: Finger)  Patient Location: PACU  Anesthesia Type:General  Level of Consciousness: awake  Airway & Oxygen Therapy: Patient Spontanous Breathing  Post-op Assessment: Report given to RN  Post vital signs: Reviewed and stable  Last Vitals:  Vitals Value Taken Time  BP 147/60 01/08/24 0833  Temp 36.4 C 01/08/24 0833  Pulse 85 01/08/24 0837  Resp 21 01/08/24 0837  SpO2 89 % 01/08/24 0837  Vitals shown include unfiled device data.  Last Pain:  Vitals:   01/08/24 0833  TempSrc:   PainSc: 0-No pain      Patients Stated Pain Goal: 4 (01/08/24 6045)  Complications: No notable events documented.

## 2024-01-08 NOTE — Interval H&P Note (Signed)
 History and Physical Interval Note:  01/08/2024 7:18 AM  Sarah Davila  has presented today for surgery, with the diagnosis of Right ring finger trigger finger.  The various methods of treatment have been discussed with the patient and family. After consideration of risks, benefits and other options for treatment, the patient has consented to  Procedure(s) with comments: RELEASE, A1 PULLEY, FOR TRIGGER FINGER (Right) - Right ring finger as a surgical intervention.  The patient's history has been reviewed, patient examined, no change in status, stable for surgery.  I have reviewed the patient's chart and labs.  Questions were answered to the patient's satisfaction.     [name]

## 2024-01-08 NOTE — Discharge Instructions (Addendum)
  Mark A. Dallas Schimke, MD [facility] [address] [place],  [place]  [address] Phone: [phone] Fax: [phone]    POST-OPERATIVE INSTRUCTIONS   WOUND CARE You may remove your bandage on postop day 3 and get the hand wet.  No ointments or lotions to be applied to the incision.  Do not submerge the incision for 1 month.  FOLLOW-UP If you develop a Fever (>101.5), Redness or Drainage from the surgical incision site, please call our office to arrange for an evaluation. Please call the office to schedule a follow-up appointment for your incision check if you do not already have one, 7-10 days post-operatively.  IF YOU HAVE ANY QUESTIONS, PLEASE FEEL FREE TO CALL OUR OFFICE.  HELPFUL INFORMATION  You should wean off your narcotic medicines as soon as you are able.  Most patients will be off or using minimal narcotics before their first postop appointment.   You may be more comfortable sleeping in a semi-seated position the first few nights following surgery.  Keep a pillow propped under the elbow and forearm for comfort.  If you have a recliner type of chair it might be beneficial.    We suggest you use the pain medication the first night prior to going to bed, in order to ease any pain when the anesthesia wears off. You should avoid taking pain medications on an empty stomach as it will make you nauseous.  Do not drink alcoholic beverages or take illicit drugs when taking pain medications.  You may return to work/school in the next couple of days when you feel up to it. Desk work and typing is fine.  Pain medication may make you constipated.  Below are a few solutions to try in this order: Decrease the amount of pain medication if you aren't having pain. Drink lots of decaffeinated fluids. Drink prune juice and/or each dried prunes  If the first 3 don't work start with additional solutions Take Colace - an over-the-counter stool softener Take  Senokot - an over-the-counter laxative Take Miralax - a stronger over-the-counter laxative   Post Anesthesia Home Care Instructions  Activity: Get plenty of rest for the remainder of the day. A responsible individual must stay with you for 24 hours following the procedure.  For the next 24 hours, DO NOT: -Drive a car -Advertising copywriter -Drink alcoholic beverages -Take any medication unless instructed by your physician -Make any legal decisions or sign important papers.  Meals: Start with liquid foods such as gelatin or soup. Progress to regular foods as tolerated. Avoid greasy, spicy, heavy foods. If nausea and/or vomiting occur, drink only clear liquids until the nausea and/or vomiting subsides. Call your physician if vomiting continues.  Special Instructions/Symptoms: Your throat may feel dry or sore from the anesthesia or the breathing tube placed in your throat during surgery. If this causes discomfort, gargle with warm salt water. The discomfort should disappear within 24 hours.

## 2024-01-08 NOTE — Anesthesia Procedure Notes (Signed)
 Procedure Name: Intubation Date/Time: 01/08/2024 7:44 AM  Performed by: Moshe Salisbury, CRNAPre-anesthesia Checklist: Patient identified, Patient being monitored, Timeout performed, Emergency Drugs available and Suction available Patient Re-evaluated:Patient Re-evaluated prior to induction Oxygen Delivery Method: Circle System Utilized Preoxygenation: Pre-oxygenation with 100% oxygen Induction Type: IV induction Ventilation: Mask ventilation without difficulty Laryngoscope Size: Glidescope (S3) Grade View: Grade II Tube type: Oral Tube size: 7.0 mm Number of attempts: 1 Airway Equipment and Method: stylet Placement Confirmation: ETT inserted through vocal cords under direct vision, positive ETCO2 and breath sounds checked- equal and bilateral Secured at: 21 cm Tube secured with: Tape Dental Injury: Teeth and Oropharynx as per pre-operative assessment

## 2024-01-08 NOTE — Anesthesia Postprocedure Evaluation (Signed)
 Anesthesia Post Note  Patient: Sarah Davila  Procedure(s) Performed: RELEASE, A1 PULLEY, FOR TRIGGER FINGER (Right: Finger)  Patient location during evaluation: PACU Anesthesia Type: General Level of consciousness: awake and alert Pain management: pain level controlled Vital Signs Assessment: post-procedure vital signs reviewed and stable Respiratory status: spontaneous breathing, nonlabored ventilation, respiratory function stable and patient connected to nasal cannula oxygen Cardiovascular status: blood pressure returned to baseline and stable Postop Assessment: no apparent nausea or vomiting Anesthetic complications: no   There were no known notable events for this encounter.   Last Vitals:  Vitals:   01/08/24 0900 01/08/24 0906  BP: 134/72 132/76  Pulse: 84 82  Resp: (!) 23 16  Temp: (!) 36.4 C 36.4 C  SpO2: 95% 100%    Last Pain:  Vitals:   01/08/24 0906  TempSrc: Oral  PainSc: 0-No pain                 Andrus Sharp L Karder Goodin

## 2024-01-08 NOTE — H&P (Signed)
 Below is the most recent clinic note for Sarah Davila; any pertinent information regarding their recent medical history will be updated on the day of surgery.   New Patient Visit  Assessment: Sarah Davila is a 63 y.o. female with the following: Right ring finger trigger finger  Plan: Symptoms and physical exam most consistent with trigger finger.  We discussed etiology, and potential treatment options.  Surgery was discussed in detail, including the plan for procedure, and expected recovery.  She has previously had an injection, and the pain and triggering has returned.  As a result, she is interested in surgery.  Procedure was discussed in detail.  She will discuss surgery with family, and work, contact the clinic when she is ready to proceed.  She states that she will most likely proceed with surgery on a Thursday.  Risks and benefits of the surgery, including, but not limited to infection, bleeding, persistent pain, need for further surgery, recurrence of symptoms and more severe complications associated with anesthesia were discussed with the patient.  The patient has elected to proceed.    History of Present Illness: Sarah Davila is a 63 y.o. female who presents for evaluation of Right Ring finger pain.  She has previously been followed by Dr. Hilda Lias.  She had an injection in the right ring finger, A1 pulley area approximately 6 months ago.  She had excellent response following the injection.  She return for repeat evaluation, and ultimately referred to clinic for further discussion regarding the procedure.  She is not actively having triggering at this point.  She is having some pain in the area of the A1 pulley.  Pain is worse than it was before she had the injection.  Otherwise, the injection was very successful.  No numbness or tingling in the right hand.  She is having some numbness and tingling in the left hand.   Review of Systems: No fevers or chills No numbness  or tingling No chest pain No shortness of breath No bowel or bladder dysfunction No GI distress No headaches   Medical History:  Past Medical History:  Diagnosis Date   Coronary artery disease    Hypertension    Kidney stone    Renal disorder    Sepsis (HCC) 2015   urinary    Past Surgical History:  Procedure Laterality Date   BREAST BIOPSY Left 2014   benign   CYSTOSCOPY W/ URETERAL STENT PLACEMENT Bilateral 02/04/2019   Procedure: CYSTOSCOPY WITH RETROGRADE PYELOGRAM/URETERAL STENT PLACEMENT;  Surgeon: Bjorn Pippin, MD;  Location: [facility];  Service: Urology;  Laterality: Bilateral;   CYSTOSCOPY/URETEROSCOPY/HOLMIUM LASER/STENT PLACEMENT Bilateral 02/16/2019   Procedure: CYSTOSCOPY BILATERAL URETEROSCOPY WITH HOLMIUM LASER AND STENTS PLACEMENT;  Surgeon: Bjorn Pippin, MD;  Location: [facility];  Service: Urology;  Laterality: Bilateral;    Family History  Problem Relation Age of Onset   Hepatitis Mother    Cirrhosis Mother    Diabetes Father    Cancer Other    Social History   Tobacco Use   Smoking status: Never   Smokeless tobacco: Never  Vaping Use   Vaping status: Never Used  Substance Use Topics   Alcohol use: Never   Drug use: Never    Allergies  Allergen Reactions   Ciprofloxacin Other (See Comments)    Sever hot flashes    Current Meds  Medication Sig   aspirin EC 81 MG tablet Take 81 mg by mouth daily.   BLACK COHOSH PO Take 700 mg  by mouth daily.   Capsaicin 0.1 % CREA Apply 1 Application topically daily as needed (wrist pain).   cetirizine (ZYRTEC) 10 MG tablet Take 10 mg by mouth daily.   fexofenadine (ALLEGRA) 180 MG tablet Take 180 mg by mouth daily.   fluticasone (FLONASE) 50 MCG/ACT nasal spray Place 2 sprays into both nostrils daily.   hydrOXYzine (VISTARIL) 25 MG capsule TAKE 1 CAPSULE BY MOUTH AT BEDTIME AS NEEDED   meloxicam (MOBIC) 7.5 MG tablet One tablet daily after eating. (Patient taking differently: Take 7.5 mg by mouth daily as  needed for pain.)   Multiple Vitamin (MULTIVITAMIN WITH MINERALS) TABS tablet Take 1 tablet by mouth daily.   naproxen sodium (ALEVE) 220 MG tablet Take 220 mg by mouth daily as needed (pain).   olmesartan (BENICAR) 20 MG tablet Take 1 tablet (20 mg total) by mouth daily.   tamsulosin (FLOMAX) 0.4 MG CAPS capsule Take 0.4 mg by mouth daily as needed (UTI/kidney stones).   Turmeric 500 MG CAPS Take 500 mg by mouth daily.   Vitamin D, Ergocalciferol, (DRISDOL) 1.25 MG (50000 UNIT) CAPS capsule Take 1 capsule by mouth once a week    Objective: BP (!) 144/66   Pulse 74   Temp 97.9 F (36.6 C) (Oral)   Resp 12   Ht 5\' 2"  (1.575 m)   Wt 111.1 kg   LMP 03/15/2013 (Approximate) Comment: LMP @ age 66. Pt has Mirena IUD  SpO2 96%   BMI 44.80 kg/m   Physical Exam:  General: Alert and oriented. and No acute distress. Gait: Normal gait.  Right hand without deformity.  No bruising.  Tenderness palpation over the A1 pulley of the ring finger.  She is able to make a fist.  Negative Tinel's.  Negative Phalen's.  No active triggering in clinic today.  Fingers warm well-perfused.  IMAGING: No new imaging obtained today     [name], MD  01/08/2024 7:17 AM

## 2024-01-09 ENCOUNTER — Encounter (HOSPITAL_COMMUNITY): Payer: Self-pay | Admitting: Orthopedic Surgery

## 2024-01-12 ENCOUNTER — Telehealth: Payer: Self-pay | Admitting: Orthopedic Surgery

## 2024-01-12 NOTE — Telephone Encounter (Signed)
 Dr. Dallas Schimke pt - spoke w/the pt, recently had surgery, she stated her instructions say to remove the bandage in a few days, she's not clear on what a few days is, 2, 3, or 4 days.  She also wants to know what she's supposed to put on it after she takes the bandage off.  [phone]

## 2024-01-12 NOTE — Telephone Encounter (Signed)
 Post-operative plan: Per Op note The patient will be discharged home. She can removed the bulky dressing in 3 days. Use a bandaid on area   Can you call her?  I'm in clinic   If she has other questions can call but would be during lunch or after 5

## 2024-01-12 NOTE — Telephone Encounter (Signed)
 I called her not not neosporin... bandaid just bandaid. She said yellow bandage (xeroform) is stuck I told her just to put the plain bandaid on top of it and just bandaid She voiced understanding

## 2024-01-16 ENCOUNTER — Ambulatory Visit: Admitting: Adult Health

## 2024-01-16 ENCOUNTER — Ambulatory Visit: Admitting: Orthopedic Surgery

## 2024-01-16 ENCOUNTER — Encounter: Payer: Self-pay | Admitting: Orthopedic Surgery

## 2024-01-16 DIAGNOSIS — M65341 Trigger finger, right ring finger: Secondary | ICD-10-CM

## 2024-01-16 NOTE — Progress Notes (Signed)
 Orthopaedic Postop Note  Assessment: Sarah Davila is a 63 y.o. female s/p Right Ring finger trigger finger release     DOS: 01/08/24  Plan: MARISAL SWAREY has done well.  Surgical incision is healing well.  Sutures were removed, and Steri-Strips were placed.  Keep incision covered.  Do not submerge the wound.  Return in 4 weeks.    Follow-up: Return in about 4 weeks (around 02/13/2024). XR at next visit: None  Subjective:  Chief Complaint  Patient presents with   Post-op Follow-up    Right ring finger- DOS 04/03    History of Present Illness: Sarah Davila is a 63 y.o. female who presents following the above stated procedure.  Surgery was approximately 8 days ago.  No issues.  Some pain for the first few days.  No issues with the incision.  No triggering.  Review of Systems: No fevers or chills Some numbness or tingling No Chest Pain No shortness of breath   Objective: LMP 03/15/2013 (Approximate) Comment: LMP @ age 67. Pt has Mirena IUD  Physical Exam:  Alert and oriented, no acute distress.  Surgical incision is healing well.  No surrounding erythema or drainage.  Mild tenderness to palpation about surgical site.  No active triggering Able to make a full fist. No redness. 2+ radial pulse.   IMAGING: I personally ordered and reviewed the following images:  No new imaging obtained today  [name], MD 01/16/2024 9:30 AM

## 2024-01-29 ENCOUNTER — Ambulatory Visit: Payer: Self-pay | Admitting: Family Medicine

## 2024-02-13 ENCOUNTER — Ambulatory Visit (INDEPENDENT_AMBULATORY_CARE_PROVIDER_SITE_OTHER): Admitting: Orthopedic Surgery

## 2024-02-13 ENCOUNTER — Encounter: Payer: Self-pay | Admitting: Orthopedic Surgery

## 2024-02-13 DIAGNOSIS — M65341 Trigger finger, right ring finger: Secondary | ICD-10-CM

## 2024-02-13 DIAGNOSIS — G8929 Other chronic pain: Secondary | ICD-10-CM

## 2024-02-13 NOTE — Progress Notes (Signed)
 Orthopaedic Postop Note  Assessment: Sarah Davila is a 63 y.o. female s/p Right Ring finger trigger finger release     DOS: 01/08/24  Plan: [name] is improving.  She still has some sensitivity around the incision.  There is also some visible swelling.  This is affecting her motion overall.  I have provided her with hand exercises to continue to work.  Okay to gently massage the incision to help with desensitization.  If she continues to struggle in making a full fist, we can consider hand therapy.  We can also consider short course of steroids to help with the swelling.  She states understanding.  Follow-up in a month.   Follow-up: Return in about 4 weeks (around 03/12/2024). XR at next visit: None  Subjective:  Chief Complaint  Patient presents with   Post-op Follow-up    Scar is still sensitive to touch right hand s/p trigger finger release     History of Present Illness: Sarah Davila is a 63 y.o. female who presents following the above stated procedure.  Surgery was approximately 1 month ago.  She is getting better.  She notes that she has some restrictions in her motion.  She still has some swelling in her hand.  The area around the incision is still tender to touch.  She has been using the hand more more.  However, she notes that she is lacking some motion.   Review of Systems: No fevers or chills No numbness or tingling No Chest Pain No shortness of breath   Objective: LMP 03/15/2013 (Approximate) Comment: LMP @ age 75. Pt has Mirena IUD  Physical Exam:  Alert and oriented, no acute distress.  Surgical incision is healing well.  No surrounding erythema or drainage.  Mild tenderness to palpation about surgical site.  No active triggering Some mild swelling around the incision.  No concern for infection She is lacking a little bit of flexion, not quite able to make a full composition fist. Sensation is intact throughout the right ring  finger.  IMAGING: I personally ordered and reviewed the following images:  No new imaging obtained today  Tonita Frater, MD 02/13/2024 8:57 AM

## 2024-02-13 NOTE — Patient Instructions (Signed)
Hand Exercises  Hand exercises can be helpful for almost anyone. These exercises can strengthen the hands, improve flexibility and movement, and increase blood flow to the hands. These results can make work and daily tasks easier. Hand exercises can be especially helpful for people who have joint pain from arthritis or have nerve damage from overuse (carpal tunnel syndrome). These exercises can also help people who have injured a hand.  Exercises Most of these hand exercises are gentle stretching and motion exercises. It is usually safe to do them often throughout the day. Warming up your hands before exercise may help to reduce stiffness. You can do this with gentle massage or by placing your hands in warm water for 10-15 minutes. It is normal to feel some stretching, pulling, tightness, or mild discomfort as you begin new exercises. This will gradually improve. Stop an exercise right away if you feel sudden, severe pain or your pain gets worse. Ask your health care provider which exercises are best for you. Knuckle bend or "claw" fist Stand or sit with your arm, hand, and all five fingers pointed straight up. Make sure to keep your wrist straight during the exercise. Gently bend your fingers down toward your palm until the tips of your fingers are touching the top of your palm. Keep your big knuckle straight and just bend the small knuckles in your fingers. Hold this position for 10 seconds. Straighten (extend) your fingers back to the starting position. Repeat this exercise 5-10 times with each hand. Full finger fist Stand or sit with your arm, hand, and all five fingers pointed straight up. Make sure to keep your wrist straight during the exercise. Gently bend your fingers into your palm until the tips of your fingers are touching the middle of your palm. Hold this position for 10 seconds. Extend your fingers back to the starting position, stretching every joint fully. Repeat this exercise  5-10 times with each hand. Straight fist Stand or sit with your arm, hand, and all five fingers pointed straight up. Make sure to keep your wrist straight during the exercise. Gently bend your fingers at the big knuckle, where your fingers meet your hand, and the middle knuckle. Keep the knuckle at the tips of your fingers straight and try to touch the bottom of your palm. Hold this position for 10 seconds. Extend your fingers back to the starting position, stretching every joint fully. Repeat this exercise 5-10 times with each hand. Tabletop Stand or sit with your arm, hand, and all five fingers pointed straight up. Make sure to keep your wrist straight during the exercise. Gently bend your fingers at the big knuckle, where your fingers meet your hand, as far down as you can while keeping the small knuckles in your fingers straight. Think of forming a tabletop with your fingers. Hold this position for 10 seconds. Extend your fingers back to the starting position, stretching every joint fully. Repeat this exercise 5-10 times with each hand. Finger spread Place your hand flat on a table with your palm facing down. Make sure your wrist stays straight as you do this exercise. Spread your fingers and thumb apart from each other as far as you can until you feel a gentle stretch. Hold this position for 10 seconds. Bring your fingers and thumb tight together again. Hold this position for 10 seconds. Repeat this exercise 5-10 times with each hand. Making circles Stand or sit with your arm, hand, and all five fingers pointed straight up. Make   sure to keep your wrist straight during the exercise. Make a circle by touching the tip of your thumb to the tip of your index finger. Hold for 10 seconds. Then open your hand wide. Repeat this motion with your thumb and each finger on your hand. Repeat this exercise 5-10 times with each hand. Thumb motion Sit with your forearm resting on a table and your wrist  straight. Your thumb should be facing up toward the ceiling. Keep your fingers relaxed as you move your thumb. Lift your thumb up as high as you can toward the ceiling. Hold for 10 seconds. Bend your thumb across your palm as far as you can, reaching the tip of your thumb for the small finger (pinkie) side of your palm. Hold for 10 seconds. Repeat this exercise 5-10 times with each hand. Grip strengthening Hold a stress ball or other soft ball in the middle of your hand. Slowly increase the pressure, squeezing the ball as much as you can without causing pain. Think of bringing the tips of your fingers into the middle of your palm. All of your finger joints should bend when doing this exercise. Hold your squeeze for 10 seconds, then relax. Repeat this exercise 5-10 times with each hand.   Contact a health care provider if: Your hand pain or discomfort gets much worse when you do an exercise. Your hand pain or discomfort does not improve within 2 hours after you exercise. If you have any of these problems, stop doing these exercises right away. Do not do them again unless your health care provider says that you can.    Get help right away if: You develop sudden, severe hand pain or swelling. If this happens, stop doing these exercises right away. Do not do them again unless your health care provider says that you can. This information is not intended to replace advice given to you by your health care provider. Make sure you discuss any questions you have with your health care provider.  

## 2024-02-16 ENCOUNTER — Telehealth: Payer: Self-pay | Admitting: Orthopedic Surgery

## 2024-02-16 MED ORDER — PREDNISONE 10 MG (21) PO TBPK: ORAL_TABLET | ORAL | 0 refills | Status: AC

## 2024-02-16 NOTE — Addendum Note (Signed)
 Addended by: Sharol Decamp A on: 02/16/2024 09:08 PM   Modules accepted: Orders

## 2024-02-16 NOTE — Telephone Encounter (Signed)
 Dr. Ernesta Heading pt - spoke w/the patient, she stated that Dr. Tita Form and her discussed steroids.  She would like to try them, [facility] [place]. [phone]

## 2024-02-17 ENCOUNTER — Ambulatory Visit: Admitting: Adult Health

## 2024-02-18 ENCOUNTER — Other Ambulatory Visit: Payer: Self-pay | Admitting: Family Medicine

## 2024-02-18 DIAGNOSIS — E559 Vitamin D deficiency, unspecified: Secondary | ICD-10-CM

## 2024-03-17 ENCOUNTER — Ambulatory Visit (INDEPENDENT_AMBULATORY_CARE_PROVIDER_SITE_OTHER): Admitting: Orthopedic Surgery

## 2024-03-17 ENCOUNTER — Encounter: Payer: Self-pay | Admitting: Orthopedic Surgery

## 2024-03-17 DIAGNOSIS — M65341 Trigger finger, right ring finger: Secondary | ICD-10-CM

## 2024-03-17 NOTE — Progress Notes (Signed)
 Orthopaedic Postop Note  Assessment: Sarah Davila is a 63 y.o. female s/p Right Ring finger trigger finger release     DOS: 01/08/24  Plan: Sarah Davila is better.  Some residual swelling.  She also has some stiffness.  She is pleased with her improvements thus far.  Pain and swelling continues to get better.  She is using the hand more consistently.  No concerns at this time.  Nothing further is needed.  She will follow-up as needed.  Follow-up: Return if symptoms worsen or fail to improve. XR at next visit: None  Subjective:  Chief Complaint  Patient presents with   Routine Post Op    R ring finger trigger release DOS 01/08/24- Some stiffness but feels better    History of Present Illness: Sarah Davila is a 63 y.o. female who presents following the above stated procedure.  Surgery was approximately 2 months ago.  She has improved.  She does continue to have some stiffness in her finger, but no more triggering.  Pain is better.  No issues with her incision.  Review of Systems: No fevers or chills No numbness or tingling No Chest Pain No shortness of breath   Objective: LMP 03/15/2013 (Approximate) Comment: LMP @ age 58. Pt has Mirena IUD  Physical Exam:  Alert and oriented, no acute distress.  Volar hand incision is healed.  No surrounding erythema or drainage.  Mild tenderness to palpation in this area.  Some residual, diffuse swelling.  Mild stiffness, not quite able to make a full fist.  IMAGING: I personally ordered and reviewed the following images:  No new imaging obtained today  Tonita Frater, MD 03/17/2024 4:06 PM

## 2024-04-01 ENCOUNTER — Ambulatory Visit: Admitting: Adult Health

## 2024-04-16 ENCOUNTER — Ambulatory Visit: Admitting: Family Medicine

## 2024-05-20 ENCOUNTER — Ambulatory Visit: Admitting: Adult Health

## 2024-05-21 ENCOUNTER — Ambulatory Visit: Admitting: Adult Health

## 2024-05-23 ENCOUNTER — Other Ambulatory Visit: Payer: Self-pay | Admitting: Internal Medicine

## 2024-05-24 ENCOUNTER — Other Ambulatory Visit: Payer: Self-pay

## 2024-05-24 MED ORDER — OLMESARTAN MEDOXOMIL 20 MG PO TABS: 20.0000 mg | ORAL_TABLET | Freq: Every day | ORAL | 2 refills | Status: DC

## 2024-05-24 NOTE — Telephone Encounter (Signed)
 Called and spoke with pt to inform her that after 3 months for dr wert prescribing her bp meds her pcp is supposed to start handling that medication but he is fine with refilling for another 3 months but after that he needs to set that medication up with her pcp. Pt confirmed understanding. NFN

## 2024-05-24 NOTE — Telephone Encounter (Unsigned)
 Copied from CRM #[id number]. Topic: Clinical - Prescription Issue >> May 24, 2024  9:53 AM Rilla B wrote: Patient calling because her blood pressure medication that Dr Chari orders was denied and she needs to discuss why. Patient has one pill for today. Note, patient states she has dental work on Thursday and will wait to take the pill before the appt because they do take bp at that appt. Please call patient [phone].

## 2024-05-28 ENCOUNTER — Encounter: Payer: Self-pay | Admitting: Radiology

## 2024-07-08 ENCOUNTER — Ambulatory Visit: Payer: Self-pay

## 2024-07-08 NOTE — Telephone Encounter (Addendum)
 FYI Only or Action Required?: FYI only for provider.  Patient was last seen in primary care on 07/17/2023 by Edman Meade PEDLAR, FNP.  Called Nurse Triage reporting Covid Positive.  Symptoms began yesterday.  Interventions attempted: OTC medications: tylenol .  Symptoms are: gradually improving.  Triage Disposition: Home Care  Patient/caregiver understands and will follow disposition?: Yes    First attempt; no answer     coughing - positive to covid.

## 2024-07-08 NOTE — Telephone Encounter (Signed)
 Reason for Disposition . [1] COVID-19 diagnosed by positive lab test (e.g., PCR, rapid self-test kit) AND [2] NO symptoms (e.g., cough, fever, others)  Answer Assessment - Initial Assessment Questions 1. SYMPTOMS: What is your main symptom or concern? (e.g., cough, fever, shortness of breath, muscle aches)     cough 2. ONSET: When did the symptoms start?      Yesterday took covid home test today and it was positive 3. COUGH: Do you have a cough? If Yes, ask: How bad is the cough?       Yes, moderate 4. FEVER: Do you have a fever? If Yes, ask: What is your temperature, how was it measured, and when did it start?     no 5. BREATHING DIFFICULTY: Are you having any difficulty breathing? (e.g., normal; shortness of breath, wheezing, unable to speak)      Sob yesterday, hx asthma, states no sob today 6. BETTER-SAME-WORSE: Are you getting better, staying the same or getting worse compared to yesterday?  If getting worse, ask, In what way?     better 7. OTHER SYMPTOMS: Do you have any other symptoms?  (e.g., chills, fatigue, headache, loss of smell or taste, muscle pain, sore throat)     Headache yesterday 8. COVID-19 DIAGNOSIS: How do you know that you have COVID? (e.g., positive lab test or self-test, diagnosed by doctor or NP/PA, symptoms after exposure).     Tested at home this am 9. COVID-19 EXPOSURE: Was there any known exposure to COVID before the symptoms began?      unknown 10. COVID-19 VACCINE: Have you had the COVID-19 vaccine? If Yes, ask: When did you last get it?       Yes last Wednesday  11. HIGH RISK DISEASE: Do you have any chronic medical problems? (e.g., asthma, heart or lung disease, weak immune system, obesity, etc.)       asthma 12. PREGNANCY: Is there any chance you are pregnant? When was your last menstrual period?       na 13. O2 SATURATION MONITOR:  Do you use an oxygen saturation monitor (pulse oximeter) at home? If Yes, ask What is  your reading (oxygen level) today? What is your usual oxygen saturation reading? (e.g., 95%)       na  Protocols used: COVID-19 - Diagnosed or Suspected-A-AH

## 2024-07-26 ENCOUNTER — Telehealth: Payer: Self-pay

## 2024-07-26 ENCOUNTER — Encounter: Admitting: Family Medicine

## 2024-07-26 NOTE — Telephone Encounter (Signed)
 Copied from CRM #[id number]. Topic: Clinical - Medication Question >> Jul 26, 2024  8:07 AM Olam RAMAN wrote: Reason for CRM:  pt asked about olmesartan  (BENICAR ) 20 MG tablet Stated pharmacy advised they will no longer be supplying medication and provider will need to prescribe medication for pt from and future

## 2024-08-09 ENCOUNTER — Other Ambulatory Visit: Payer: Self-pay | Admitting: Family Medicine

## 2024-08-09 ENCOUNTER — Encounter: Payer: Self-pay | Admitting: Radiology

## 2024-08-09 MED ORDER — OLMESARTAN MEDOXOMIL 20 MG PO TABS: 20.0000 mg | ORAL_TABLET | Freq: Every day | ORAL | 2 refills | Status: DC

## 2024-08-09 NOTE — Telephone Encounter (Signed)
 Copied from CRM [id number]. Topic: Clinical - Medication Refill >> Aug 09, 2024 10:55 AM Selinda RAMAN wrote: Medication: olmesartan  (BENICAR ) 20 MG tablet  Has the patient contacted their pharmacy? Yes   This is the patient's preferred pharmacy:  [facility] [address] [place] - [address]  [address] [place] [address] Phone: [phone] Fax: [phone]  Is this the correct pharmacy for this prescription? Yes If no, delete pharmacy and type the correct one.   Has the prescription been filled recently? No  Is the patient out of the medication? No  Has the patient been seen for an appointment in the last year OR does the patient have an upcoming appointment? Yes  Can we respond through MyChart? Yes  Please assist patient further

## 2024-10-06 ENCOUNTER — Other Ambulatory Visit (HOSPITAL_COMMUNITY): Payer: Self-pay | Admitting: Family Medicine

## 2024-10-06 ENCOUNTER — Other Ambulatory Visit: Payer: Self-pay

## 2024-10-06 ENCOUNTER — Telehealth: Payer: Self-pay

## 2024-10-06 ENCOUNTER — Ambulatory Visit (HOSPITAL_COMMUNITY)
Admission: RE | Admit: 2024-10-06 | Discharge: 2024-10-06 | Disposition: A | Discharge status: Home / Self Care | Source: Ambulatory Visit | Attending: Urology | Admitting: Urology

## 2024-10-06 DIAGNOSIS — N200 Calculus of kidney: Secondary | ICD-10-CM | POA: Diagnosis present

## 2024-10-06 DIAGNOSIS — Z1231 Encounter for screening mammogram for malignant neoplasm of breast: Secondary | ICD-10-CM

## 2024-10-06 NOTE — Telephone Encounter (Signed)
 Pt called in today for a Rx for Flomax . Pt is made aware that Dr. Watt is no longer at our office and she will need an X-ray and scheduled with a provider here. Pt is aware X-ray order in and we will sent to MD once we received X-ray. Pt voiced understanding.

## 2024-10-11 ENCOUNTER — Ambulatory Visit (HOSPITAL_COMMUNITY)

## 2024-10-13 ENCOUNTER — Ambulatory Visit: Payer: Self-pay | Admitting: Family Medicine

## 2024-10-13 NOTE — Telephone Encounter (Signed)
 Return call to patient making her aware KUB has been routed to MD, [place]. Once he review someone will reach out with results. Voiced understanding

## 2024-10-15 ENCOUNTER — Encounter (HOSPITAL_COMMUNITY): Payer: Self-pay

## 2024-10-15 ENCOUNTER — Ambulatory Visit (HOSPITAL_COMMUNITY)
Admission: RE | Admit: 2024-10-15 | Discharge: 2024-10-15 | Disposition: A | Source: Ambulatory Visit | Attending: Family Medicine

## 2024-10-15 DIAGNOSIS — Z1231 Encounter for screening mammogram for malignant neoplasm of breast: Secondary | ICD-10-CM | POA: Insufficient documentation

## 2024-10-18 ENCOUNTER — Ambulatory Visit: Payer: Self-pay

## 2024-10-22 ENCOUNTER — Telehealth: Payer: Self-pay

## 2024-10-22 ENCOUNTER — Telehealth: Payer: Self-pay | Admitting: Family Medicine

## 2024-10-22 DIAGNOSIS — R0602 Shortness of breath: Secondary | ICD-10-CM

## 2024-10-22 DIAGNOSIS — E038 Other specified hypothyroidism: Secondary | ICD-10-CM

## 2024-10-22 DIAGNOSIS — E559 Vitamin D deficiency, unspecified: Secondary | ICD-10-CM

## 2024-10-22 DIAGNOSIS — R7301 Impaired fasting glucose: Secondary | ICD-10-CM | POA: Diagnosis not present

## 2024-10-22 DIAGNOSIS — E7849 Other hyperlipidemia: Secondary | ICD-10-CM | POA: Diagnosis not present

## 2024-10-22 NOTE — Telephone Encounter (Signed)
 Copied from CRM [id number]. Topic: Clinical - Request for Lab/Test Order >> Oct 22, 2024  9:52 AM Sarah Davila wrote: Reason for CRM: Wants lab work to test if she has activated cancer cells. She does not know the name of the lab test however 4 of her siblings have passed from cancer.

## 2024-10-22 NOTE — Progress Notes (Signed)
 "  Virtual Visit via Video Note  I connected with Sarah Davila on 10/22/24 at 10:40 AM EST by a video enabled telemedicine application and verified that I am speaking with the correct person using two identifiers.  Patient Location: Home Provider Location: Home Office  I discussed the limitations, risks, security, and privacy concerns of performing an evaluation and management service by video and the availability of in person appointments. I also discussed with the patient that there may be a patient responsible charge related to this service. The patient expressed understanding and agreed to proceed.  Subjective: PCP: Edman Meade PEDLAR, FNP  Chief Complaint  Patient presents with   Medical Management of Chronic Issues    Follow up    HPI  The patient is seen today for follow-up of chronic medical conditions and reports new-onset shortness of breath with exertion. She denies chest pain, palpitations, dizziness, lightheadedness, syncope, or near-syncope. She also denies swelling of the lower extremities. The patient expresses concern about her cardiac health and requests evaluation of her heart.  Additionally, the patient is concerned about the possibility of diabetes due to a family history of diabetes. She denies symptoms of hyperglycemia, including polyuria, polydipsia, and polyphagia.    ROS: Per HPI Current Medications[1]  Observations/Objective: There were no vitals filed for this visit. Physical Exam Patient is well-developed, well-nourished in no acute distress.  Resting comfortably at home.  Head is normocephalic, atraumatic.  No labored breathing.  Speech is clear and coherent with logical content.  Patient is alert and oriented at baseline.   Assessment and Plan: SOB (shortness of breath) on exertion -     Brain natriuretic peptide -     D-dimer, quantitative  IFG (impaired fasting glucose) -     Hemoglobin A1c  Vitamin D  deficiency -     VITAMIN D  25  Hydroxy (Vit-D Deficiency, Fractures)  TSH (thyroid-stimulating hormone deficiency) -     TSH + free T4  Other hyperlipidemia -     Lipid panel -     CMP14+EGFR -     CBC with Differential/Platelet  Pending labs at this time. Shortness of breath is likely related to physical deconditioning/exertion, as the patient reports being out of shape. The patient was encouraged to gradually increase physical activity as tolerated and to use her prescribed inhaler regularly as directed. Cardiac-related labs were obtained today to further evaluate symptoms. The patient was advised to continue her current treatment regimen and to follow up if symptoms worsen or fail to improve. Hemoglobin A1c was obtained today. The patient was counseled on reducing intake of high-sugar foods and beverages and increasing physical activity to improve glycemic control. Encouraged to continue care with pulmonary the patient verbalized understanding and agrees with the plan.  Follow Up Instructions: No follow-ups on file.   I discussed the assessment and treatment plan with the patient. The patient was provided an opportunity to ask questions, and all were answered. The patient agreed with the plan and demonstrated an understanding of the instructions.   The patient was advised to call back or seek an in-person evaluation if the symptoms worsen or if the condition fails to improve as anticipated.  The above assessment and management plan was discussed with the patient. The patient verbalized understanding of and has agreed to the management plan.   Rumor Sun  Z Bacchus, FNP      [1]  Current Outpatient Medications:    albuterol  (VENTOLIN  HFA) 108 (90 Base) MCG/ACT inhaler, Inhale  2 puffs into the lungs every 6 (six) hours as needed for wheezing or shortness of breath., Disp: 8 g, Rfl: 2   aspirin EC 81 MG tablet, Take 81 mg by mouth daily., Disp: , Rfl:    BLACK COHOSH PO, Take 700 mg by mouth daily., Disp: , Rfl:     Capsaicin  0.1 % CREA, Apply 1 Application topically daily as needed (wrist pain)., Disp: , Rfl:    cetirizine (ZYRTEC) 10 MG tablet, Take 10 mg by mouth daily., Disp: , Rfl:    fexofenadine (ALLEGRA) 180 MG tablet, Take 180 mg by mouth daily., Disp: , Rfl:    fluticasone  (FLONASE ) 50 MCG/ACT nasal spray, Place 2 sprays into both nostrils daily., Disp: , Rfl:    hydrOXYzine  (VISTARIL ) 25 MG capsule, TAKE 1 CAPSULE BY MOUTH AT BEDTIME AS NEEDED, Disp: 30 capsule, Rfl: 0   meloxicam  (MOBIC ) 7.5 MG tablet, One tablet daily after eating. (Patient taking differently: Take 7.5 mg by mouth daily as needed for pain.), Disp: 30 tablet, Rfl: 5   Multiple Vitamin (MULTIVITAMIN WITH MINERALS) TABS tablet, Take 1 tablet by mouth daily., Disp: , Rfl:    naproxen  sodium (ALEVE ) 220 MG tablet, Take 220 mg by mouth daily as needed (pain)., Disp: , Rfl:    olmesartan  (BENICAR ) 20 MG tablet, Take 1 tablet (20 mg total) by mouth daily., Disp: 30 tablet, Rfl: 2   predniSONE  (STERAPRED UNI-PAK 21 TAB) 10 MG (21) TBPK tablet, 10 mg DS 12 as directed, Disp: 48 tablet, Rfl: 0   sulfamethoxazole -trimethoprim  (BACTRIM  DS) 800-160 MG tablet, Take 0.5 tablets by mouth at bedtime as needed (uti/ kidney stones)., Disp: , Rfl:    tamsulosin  (FLOMAX ) 0.4 MG CAPS capsule, Take 0.4 mg by mouth daily as needed (UTI/kidney stones)., Disp: , Rfl:    Turmeric 500 MG CAPS, Take 500 mg by mouth daily., Disp: , Rfl:    Vitamin D , Ergocalciferol , (DRISDOL ) 1.25 MG (50000 UNIT) CAPS capsule, Take 1 capsule by mouth once a week, Disp: 10 capsule, Rfl: 2  "

## 2024-10-25 NOTE — Telephone Encounter (Signed)
 Message sent to patient.

## 2024-10-25 NOTE — Telephone Encounter (Signed)
 Please inform the patient that there is no validated blood test that can detect cancer cells in asymptomatic individuals.

## 2024-10-26 LAB — CMP14+EGFR
ALT: 55 IU/L — ABNORMAL HIGH (ref 0–32)
AST: 42 IU/L — ABNORMAL HIGH (ref 0–40)
Albumin: 4.3 g/dL (ref 3.9–4.9)
Alkaline Phosphatase: 138 IU/L — ABNORMAL HIGH (ref 49–135)
BUN/Creatinine Ratio: 15 (ref 12–28)
BUN: 12 mg/dL (ref 8–27)
Bilirubin Total: 0.4 mg/dL (ref 0.0–1.2)
CO2: 21 mmol/L (ref 20–29)
Calcium: 10 mg/dL (ref 8.7–10.3)
Chloride: 104 mmol/L (ref 96–106)
Creatinine, Ser: 0.79 mg/dL (ref 0.57–1.00)
Globulin, Total: 2.9 g/dL (ref 1.5–4.5)
Glucose: 91 mg/dL (ref 70–99)
Potassium: 4.5 mmol/L (ref 3.5–5.2)
Sodium: 141 mmol/L (ref 134–144)
Total Protein: 7.2 g/dL (ref 6.0–8.5)
eGFR: 85 mL/min/1.73

## 2024-10-26 LAB — TSH+FREE T4
Free T4: 1.04 ng/dL (ref 0.82–1.77)
TSH: 1.93 u[IU]/mL (ref 0.450–4.500)

## 2024-10-26 LAB — CBC WITH DIFFERENTIAL/PLATELET
Basophils Absolute: 0.1 x10E3/uL (ref 0.0–0.2)
Basos: 1 %
EOS (ABSOLUTE): 0.4 x10E3/uL (ref 0.0–0.4)
Eos: 5 %
Hematocrit: 43.1 % (ref 34.0–46.6)
Hemoglobin: 14.1 g/dL (ref 11.1–15.9)
Immature Grans (Abs): 0 x10E3/uL (ref 0.0–0.1)
Immature Granulocytes: 0 %
Lymphocytes Absolute: 2.5 x10E3/uL (ref 0.7–3.1)
Lymphs: 30 %
MCH: 29.1 pg (ref 26.6–33.0)
MCHC: 32.7 g/dL (ref 31.5–35.7)
MCV: 89 fL (ref 79–97)
Monocytes Absolute: 0.6 x10E3/uL (ref 0.1–0.9)
Monocytes: 8 %
Neutrophils Absolute: 4.6 x10E3/uL (ref 1.4–7.0)
Neutrophils: 56 %
Platelets: 338 x10E3/uL (ref 150–450)
RBC: 4.85 x10E6/uL (ref 3.77–5.28)
RDW: 12.4 % (ref 11.7–15.4)
WBC: 8.1 x10E3/uL (ref 3.4–10.8)

## 2024-10-26 LAB — LIPID PANEL
Chol/HDL Ratio: 3.2 ratio (ref 0.0–4.4)
Cholesterol, Total: 238 mg/dL — ABNORMAL HIGH (ref 100–199)
HDL: 74 mg/dL
LDL Chol Calc (NIH): 143 mg/dL — ABNORMAL HIGH (ref 0–99)
Triglycerides: 123 mg/dL (ref 0–149)
VLDL Cholesterol Cal: 21 mg/dL (ref 5–40)

## 2024-10-26 LAB — HEMOGLOBIN A1C
Est. average glucose Bld gHb Est-mCnc: 114 mg/dL
Hgb A1c MFr Bld: 5.6 % (ref 4.8–5.6)

## 2024-10-26 LAB — VITAMIN D 25 HYDROXY (VIT D DEFICIENCY, FRACTURES): Vit D, 25-Hydroxy: 19.7 ng/mL — ABNORMAL LOW (ref 30.0–100.0)

## 2024-10-27 ENCOUNTER — Other Ambulatory Visit: Payer: Self-pay | Admitting: Family Medicine

## 2024-10-27 ENCOUNTER — Ambulatory Visit: Payer: Self-pay | Admitting: Family Medicine

## 2024-10-27 DIAGNOSIS — E559 Vitamin D deficiency, unspecified: Secondary | ICD-10-CM

## 2024-10-27 MED ORDER — VITAMIN D (ERGOCALCIFEROL) 1.25 MG (50000 UNIT) PO CAPS: 50000.0000 [IU] | ORAL_CAPSULE | ORAL | 2 refills | Status: AC

## 2024-10-27 NOTE — Progress Notes (Signed)
 Please inform the patient to continue her weekly vitamin D  supplement, as her vitamin D  levels are low. Her cholesterol levels are elevated, and I recommend implementing lifestyle changes, including decreasing intake of greasy, fatty, and starchy foods and increasing physical activity.

## 2024-11-08 ENCOUNTER — Other Ambulatory Visit: Payer: Self-pay | Admitting: Family Medicine

## 2025-03-07 ENCOUNTER — Ambulatory Visit: Admitting: Urology

## 2025-03-25 ENCOUNTER — Ambulatory Visit: Payer: Self-pay
